# Patient Record
Sex: Female | Born: 1935
Health system: Southern US, Community
[De-identification: ages and names within clinical notes are randomized; demographics above are authoritative.]

## PROBLEM LIST (undated history)

## (undated) DIAGNOSIS — E0590 Thyrotoxicosis, unspecified without thyrotoxic crisis or storm: Secondary | ICD-10-CM

## (undated) DIAGNOSIS — I10 Essential (primary) hypertension: Secondary | ICD-10-CM

## (undated) HISTORY — PX: COLECTOMY: SHX59

## (undated) NOTE — *Deleted (*Deleted)
Physical Therapy Session Note  Patient Details  Name: Cindy Smith MRN: [id number] Date of Birth: 1923/09/28  {CHL IP REHAB PT TIME CALCULATION:[id number]}  Short Term Goals: Week 1:  PT Short Term Goal 1 (Week 1): Pt will transfer to and from [facility] with CGA. PT Short Term Goal 2 (Week 1): Pt will ambulate 60ft with CGA and RW PT Short Term Goal 3 (Week 1): Pt will perform bed mobility with supervision assist  Skilled Therapeutic Interventions/Progress Updates:     Pt received seated in recliner and agrees to therapy. Reports having "rough night and morning", but no complaint of pain. Pt has new hinged knee brace on L knee to provide stability and limit genu valgus. Pt performs SPT to WC with CGA. WC transport to gym for time management. Pt performs SPT to Nustep with CGA and increased time. Performs Nustep on workload of 3, for strengthening, endurance, and reciprocal coordination training. PT uses theraband to provide external force to encourage L hip external rotation with pt verbalizing good response. Pt completes 15:00 with several brief seated rest breaks.  SPT from Nustep>WC>mat table with CGA. Sit to supine with close supervision and cues on positioning. Pt performs supine therex for hip strengthening. 1x20 supine clamshells, 3x15 clamshells in R sidelying with level 1 theraband, 2x10 L hip abduction in R sidelying, 3x10 supine bridges with therband to increase gluteus medius activation and PT providing tactile cues for optimal performance.  Pt ambulates x10' with RW and minA. L foot sliding out laterally during gait due to extreme genu valgus and internal rotation of L hip.  Pt left seated in WC with alarm intact and all needs within reach.  Therapy Documentation Precautions:  Precautions Precautions: Fall Precaution Comments: chronic Lt knee injury, knee brace when OOB Restrictions Weight Bearing Restrictions: No   Therapy/Group: Individual Therapy  Beau Fanny,  PT, DPT 11/23/2019, 12:11 PM

---

## 2000-03-13 ENCOUNTER — Ambulatory Visit (HOSPITAL_COMMUNITY): Admission: RE | Admit: 2000-03-13 | Discharge: 2000-03-13 | Payer: Self-pay | Admitting: Gastroenterology

## 2000-03-13 ENCOUNTER — Encounter (INDEPENDENT_AMBULATORY_CARE_PROVIDER_SITE_OTHER): Payer: Self-pay | Admitting: Specialist

## 2000-03-20 ENCOUNTER — Encounter: Payer: Self-pay | Admitting: General Surgery

## 2000-03-25 ENCOUNTER — Encounter (INDEPENDENT_AMBULATORY_CARE_PROVIDER_SITE_OTHER): Payer: Self-pay

## 2000-03-25 ENCOUNTER — Inpatient Hospital Stay (HOSPITAL_COMMUNITY): Admission: RE | Admit: 2000-03-25 | Discharge: 2000-04-02 | Payer: Self-pay | Admitting: General Surgery

## 2000-04-30 ENCOUNTER — Ambulatory Visit (HOSPITAL_COMMUNITY): Admission: RE | Admit: 2000-04-30 | Discharge: 2000-04-30 | Payer: Self-pay | Admitting: *Deleted

## 2000-04-30 ENCOUNTER — Encounter: Payer: Self-pay | Admitting: *Deleted

## 2000-05-07 ENCOUNTER — Encounter: Admission: RE | Admit: 2000-05-07 | Discharge: 2000-08-05 | Payer: Self-pay | Admitting: *Deleted

## 2000-05-19 ENCOUNTER — Ambulatory Visit (HOSPITAL_COMMUNITY): Admission: RE | Admit: 2000-05-19 | Discharge: 2000-05-19 | Payer: Self-pay | Admitting: *Deleted

## 2000-05-19 ENCOUNTER — Encounter: Payer: Self-pay | Admitting: *Deleted

## 2000-06-20 ENCOUNTER — Encounter: Payer: Self-pay | Admitting: Oncology

## 2000-06-20 ENCOUNTER — Inpatient Hospital Stay (HOSPITAL_COMMUNITY): Admission: EM | Admit: 2000-06-20 | Discharge: 2000-06-25 | Payer: Self-pay | Admitting: Emergency Medicine

## 2000-08-15 ENCOUNTER — Ambulatory Visit (HOSPITAL_COMMUNITY): Admission: RE | Admit: 2000-08-15 | Discharge: 2000-08-15 | Payer: Self-pay | Admitting: *Deleted

## 2001-05-04 ENCOUNTER — Ambulatory Visit (HOSPITAL_COMMUNITY): Admission: RE | Admit: 2001-05-04 | Discharge: 2001-05-04 | Payer: Self-pay | Admitting: Gastroenterology

## 2001-07-21 ENCOUNTER — Encounter: Payer: Self-pay | Admitting: *Deleted

## 2001-07-21 ENCOUNTER — Ambulatory Visit (HOSPITAL_COMMUNITY): Admission: RE | Admit: 2001-07-21 | Discharge: 2001-07-21 | Payer: Self-pay | Admitting: *Deleted

## 2002-05-04 ENCOUNTER — Encounter: Payer: Self-pay | Admitting: Internal Medicine

## 2002-05-04 ENCOUNTER — Inpatient Hospital Stay (HOSPITAL_COMMUNITY): Admission: EM | Admit: 2002-05-04 | Discharge: 2002-05-08 | Payer: Self-pay | Admitting: Emergency Medicine

## 2002-05-04 ENCOUNTER — Encounter: Payer: Self-pay | Admitting: Emergency Medicine

## 2002-05-04 ENCOUNTER — Ambulatory Visit (HOSPITAL_COMMUNITY): Admission: RE | Admit: 2002-05-04 | Discharge: 2002-05-04 | Payer: Self-pay | Admitting: Internal Medicine

## 2002-05-05 ENCOUNTER — Encounter: Payer: Self-pay | Admitting: General Surgery

## 2002-05-06 ENCOUNTER — Encounter: Payer: Self-pay | Admitting: General Surgery

## 2002-05-11 ENCOUNTER — Ambulatory Visit (HOSPITAL_COMMUNITY): Admission: RE | Admit: 2002-05-11 | Discharge: 2002-05-11 | Payer: Self-pay | Admitting: General Surgery

## 2002-05-11 ENCOUNTER — Encounter: Payer: Self-pay | Admitting: General Surgery

## 2002-10-26 ENCOUNTER — Encounter: Payer: Self-pay | Admitting: General Surgery

## 2002-10-26 ENCOUNTER — Inpatient Hospital Stay (HOSPITAL_COMMUNITY): Admission: EM | Admit: 2002-10-26 | Discharge: 2002-11-02 | Payer: Self-pay | Admitting: Emergency Medicine

## 2002-10-26 ENCOUNTER — Encounter (INDEPENDENT_AMBULATORY_CARE_PROVIDER_SITE_OTHER): Payer: Self-pay | Admitting: Specialist

## 2002-10-26 ENCOUNTER — Encounter: Payer: Self-pay | Admitting: Surgery

## 2002-10-26 ENCOUNTER — Encounter: Payer: Self-pay | Admitting: Emergency Medicine

## 2004-04-02 ENCOUNTER — Ambulatory Visit: Payer: Self-pay | Admitting: Oncology

## 2005-04-08 ENCOUNTER — Ambulatory Visit: Payer: Self-pay | Admitting: Oncology

## 2006-04-03 ENCOUNTER — Ambulatory Visit: Payer: Self-pay | Admitting: Oncology

## 2006-04-08 LAB — COMPREHENSIVE METABOLIC PANEL
ALT: 9 U/L (ref 0–35)
AST: 12 U/L (ref 0–37)
Albumin: 4.2 g/dL (ref 3.5–5.2)
Alkaline Phosphatase: 61 U/L (ref 39–117)
BUN: 26 mg/dL — ABNORMAL HIGH (ref 6–23)
CO2: 26 mEq/L (ref 19–32)
Calcium: 9.5 mg/dL (ref 8.4–10.5)
Chloride: 103 mEq/L (ref 96–112)
Creatinine, Ser: 0.84 mg/dL (ref 0.40–1.20)
Glucose, Bld: 95 mg/dL (ref 70–99)
Potassium: 4.4 mEq/L (ref 3.5–5.3)
Sodium: 140 mEq/L (ref 135–145)
Total Bilirubin: 0.3 mg/dL (ref 0.3–1.2)
Total Protein: 7.1 g/dL (ref 6.0–8.3)

## 2006-04-08 LAB — CBC WITH DIFFERENTIAL/PLATELET
BASO%: 0.1 % (ref 0.0–2.0)
Basophils Absolute: 0 10*3/uL (ref 0.0–0.1)
EOS%: 2.2 % (ref 0.0–7.0)
Eosinophils Absolute: 0.1 10*3/uL (ref 0.0–0.5)
HCT: 33.1 % — ABNORMAL LOW (ref 34.8–46.6)
HGB: 11.5 g/dL — ABNORMAL LOW (ref 11.6–15.9)
LYMPH%: 18.9 % (ref 14.0–48.0)
MCH: 30.6 pg (ref 26.0–34.0)
MCHC: 34.7 g/dL (ref 32.0–36.0)
MCV: 88.3 fL (ref 81.0–101.0)
MONO#: 0.4 10*3/uL (ref 0.1–0.9)
MONO%: 7.9 % (ref 0.0–13.0)
NEUT#: 3.5 10*3/uL (ref 1.5–6.5)
NEUT%: 70.9 % (ref 39.6–76.8)
Platelets: 307 10*3/uL (ref 145–400)
RBC: 3.75 10*6/uL (ref 3.70–5.32)
RDW: 13.5 % (ref 11.3–14.5)
WBC: 4.9 10*3/uL (ref 3.9–10.0)
lymph#: 0.9 10*3/uL (ref 0.9–3.3)

## 2006-04-08 LAB — LACTATE DEHYDROGENASE: LDH: 134 U/L (ref 94–250)

## 2006-04-08 LAB — CEA: CEA: 0.8 ng/mL (ref 0.0–5.0)

## 2007-04-10 ENCOUNTER — Ambulatory Visit: Payer: Self-pay | Admitting: Oncology

## 2007-04-14 LAB — CBC WITH DIFFERENTIAL/PLATELET
BASO%: 0.2 % (ref 0.0–2.0)
Basophils Absolute: 0 10*3/uL (ref 0.0–0.1)
EOS%: 1.1 % (ref 0.0–7.0)
Eosinophils Absolute: 0.1 10*3/uL (ref 0.0–0.5)
HCT: 33.5 % — ABNORMAL LOW (ref 34.8–46.6)
HGB: 11.5 g/dL — ABNORMAL LOW (ref 11.6–15.9)
LYMPH%: 18.2 % (ref 14.0–48.0)
MCH: 30.3 pg (ref 26.0–34.0)
MCHC: 34.3 g/dL (ref 32.0–36.0)
MCV: 88.2 fL (ref 81.0–101.0)
MONO#: 0.4 10*3/uL (ref 0.1–0.9)
MONO%: 6.5 % (ref 0.0–13.0)
NEUT#: 4.7 10*3/uL (ref 1.5–6.5)
NEUT%: 74 % (ref 39.6–76.8)
Platelets: 242 10*3/uL (ref 145–400)
RBC: 3.81 10*6/uL (ref 3.70–5.32)
RDW: 14 % (ref 11.3–14.5)
WBC: 6.4 10*3/uL (ref 3.9–10.0)
lymph#: 1.2 10*3/uL (ref 0.9–3.3)

## 2007-04-15 LAB — COMPREHENSIVE METABOLIC PANEL
ALT: 11 U/L (ref 0–35)
AST: 14 U/L (ref 0–37)
Albumin: 4.1 g/dL (ref 3.5–5.2)
Alkaline Phosphatase: 63 U/L (ref 39–117)
BUN: 23 mg/dL (ref 6–23)
CO2: 25 mEq/L (ref 19–32)
Calcium: 9.2 mg/dL (ref 8.4–10.5)
Chloride: 105 mEq/L (ref 96–112)
Creatinine, Ser: 0.74 mg/dL (ref 0.40–1.20)
Glucose, Bld: 109 mg/dL — ABNORMAL HIGH (ref 70–99)
Potassium: 4.3 mEq/L (ref 3.5–5.3)
Sodium: 139 mEq/L (ref 135–145)
Total Bilirubin: 0.4 mg/dL (ref 0.3–1.2)
Total Protein: 6.9 g/dL (ref 6.0–8.3)

## 2007-04-15 LAB — CEA: CEA: 1 ng/mL (ref 0.0–5.0)

## 2007-04-15 LAB — LACTATE DEHYDROGENASE: LDH: 144 U/L (ref 94–250)

## 2008-04-20 ENCOUNTER — Ambulatory Visit: Payer: Self-pay | Admitting: Oncology

## 2008-04-22 LAB — CBC WITH DIFFERENTIAL/PLATELET
BASO%: 0.2 % (ref 0.0–2.0)
Basophils Absolute: 0 10*3/uL (ref 0.0–0.1)
EOS%: 2.4 % (ref 0.0–7.0)
Eosinophils Absolute: 0.1 10*3/uL (ref 0.0–0.5)
HCT: 33.5 % — ABNORMAL LOW (ref 34.8–46.6)
HGB: 11.5 g/dL — ABNORMAL LOW (ref 11.6–15.9)
LYMPH%: 20.3 % (ref 14.0–49.7)
MCH: 30.7 pg (ref 25.1–34.0)
MCHC: 34.2 g/dL (ref 31.5–36.0)
MCV: 89.8 fL (ref 79.5–101.0)
MONO#: 0.4 10*3/uL (ref 0.1–0.9)
MONO%: 7.6 % (ref 0.0–14.0)
NEUT#: 3.6 10*3/uL (ref 1.5–6.5)
NEUT%: 69.5 % (ref 38.4–76.8)
Platelets: 233 10*3/uL (ref 145–400)
RBC: 3.73 10*6/uL (ref 3.70–5.45)
RDW: 13.9 % (ref 11.2–14.5)
WBC: 5.2 10*3/uL (ref 3.9–10.3)
lymph#: 1.1 10*3/uL (ref 0.9–3.3)

## 2008-04-22 LAB — COMPREHENSIVE METABOLIC PANEL
ALT: 9 U/L (ref 0–35)
AST: 14 U/L (ref 0–37)
Albumin: 4.3 g/dL (ref 3.5–5.2)
Alkaline Phosphatase: 66 U/L (ref 39–117)
BUN: 24 mg/dL — ABNORMAL HIGH (ref 6–23)
CO2: 27 mEq/L (ref 19–32)
Calcium: 9.6 mg/dL (ref 8.4–10.5)
Chloride: 101 mEq/L (ref 96–112)
Creatinine, Ser: 0.86 mg/dL (ref 0.40–1.20)
Glucose, Bld: 93 mg/dL (ref 70–99)
Potassium: 5.2 mEq/L (ref 3.5–5.3)
Sodium: 137 mEq/L (ref 135–145)
Total Bilirubin: 0.4 mg/dL (ref 0.3–1.2)
Total Protein: 6.8 g/dL (ref 6.0–8.3)

## 2008-04-22 LAB — MORPHOLOGY: PLT EST: ADEQUATE

## 2008-04-22 LAB — LACTATE DEHYDROGENASE: LDH: 159 U/L (ref 94–250)

## 2008-04-22 LAB — CEA: CEA: 1.4 ng/mL (ref 0.0–5.0)

## 2009-04-19 ENCOUNTER — Ambulatory Visit: Payer: Self-pay | Admitting: Oncology

## 2009-04-21 LAB — COMPREHENSIVE METABOLIC PANEL
ALT: 11 U/L (ref 0–35)
AST: 13 U/L (ref 0–37)
Albumin: 4.2 g/dL (ref 3.5–5.2)
Alkaline Phosphatase: 63 U/L (ref 39–117)
BUN: 22 mg/dL (ref 6–23)
CO2: 28 mEq/L (ref 19–32)
Calcium: 9.3 mg/dL (ref 8.4–10.5)
Chloride: 102 mEq/L (ref 96–112)
Creatinine, Ser: 0.86 mg/dL (ref 0.40–1.20)
Glucose, Bld: 102 mg/dL — ABNORMAL HIGH (ref 70–99)
Potassium: 4.5 mEq/L (ref 3.5–5.3)
Sodium: 138 mEq/L (ref 135–145)
Total Bilirubin: 0.4 mg/dL (ref 0.3–1.2)
Total Protein: 6.9 g/dL (ref 6.0–8.3)

## 2009-04-21 LAB — CBC WITH DIFFERENTIAL/PLATELET
BASO%: 0.1 % (ref 0.0–2.0)
Basophils Absolute: 0 10*3/uL (ref 0.0–0.1)
EOS%: 1.5 % (ref 0.0–7.0)
Eosinophils Absolute: 0.1 10*3/uL (ref 0.0–0.5)
HCT: 32.2 % — ABNORMAL LOW (ref 34.8–46.6)
HGB: 11.1 g/dL — ABNORMAL LOW (ref 11.6–15.9)
LYMPH%: 22.9 % (ref 14.0–49.7)
MCH: 31.6 pg (ref 25.1–34.0)
MCHC: 34.6 g/dL (ref 31.5–36.0)
MCV: 91.3 fL (ref 79.5–101.0)
MONO#: 0.4 10*3/uL (ref 0.1–0.9)
MONO%: 8.7 % (ref 0.0–14.0)
NEUT#: 3.1 10*3/uL (ref 1.5–6.5)
NEUT%: 66.8 % (ref 38.4–76.8)
Platelets: 223 10*3/uL (ref 145–400)
RBC: 3.53 10*6/uL — ABNORMAL LOW (ref 3.70–5.45)
RDW: 14.1 % (ref 11.2–14.5)
WBC: 4.6 10*3/uL (ref 3.9–10.3)
lymph#: 1.1 10*3/uL (ref 0.9–3.3)

## 2009-04-21 LAB — CEA: CEA: 1.4 ng/mL (ref 0.0–5.0)

## 2009-04-21 LAB — LACTATE DEHYDROGENASE: LDH: 138 U/L (ref 94–250)

## 2010-06-22 NOTE — Discharge Summary (Signed)
NAME:  Cindy Smith, Cindy Smith                    ACCOUNT NO.:  [id number]   MEDICAL RECORD NO.:  [id number]                   PATIENT TYPE:  INP   LOCATION:  5736                                 FACILITY:   PHYSICIAN:  Sandria Bales. Ezzard Standing, M.D.               DATE OF BIRTH:  27-Jan-1924   DATE OF ADMISSION:  10/26/2002  DATE OF DISCHARGE:  11/02/2002                                 DISCHARGE SUMMARY   DISCHARGE DIAGNOSES:  1. Small bowel obstruction with perforation.  2. Rectal stricture at prior anastomosis.  3. Hypertension.   OPERATION PERFORMED:  The patient underwent a small bowel resection with  enterolysis of adhesions and rectal dilatation and three lumen central line  placement on October 26, 2002.   HISTORY OF ILLNESS:  This is a 77 year old white female who is a patient of  Dr. Adelfa Koh. Tisovec.  Has been seen by my partner Kendrick Ranch who presented  on September 21 with increasing abdominal distention.  She actually was  hospitalized at [facility] from the 16th to the 19th of  September 2004 for small bowel obstruction which she thought got better.  She was discharged home, but appeared to have incomplete resolution of this  obstruction.  She then presented to the emergency room early in the morning  of September 21 and a chest x-ray/KUB showed free air in her abdomen with  small bowel obstruction.   Significantly, she had a low anterior resection for a colon cancer by Dr.  Kendrick Ranch in February 2002.  She underwent chemotherapy by Aliene Altes.  She  is currently followed by Dr. Riley Churches from an oncology standpoint.  Had radiation therapy by Dr. Jackelyn Knife.  Dr. Vilinda Boehringer has been her  gastroenterologist.   Her other significant past history is she has been treated for hypertension  and she had a vaginal hysterectomy for benign causes in 1975.   PHYSICAL EXAMINATION:  VITAL SIGNS:  Temperature 100, blood pressure 116/47,  pulse 71.  ABDOMEN:   Distended.  She had decreased bowel sounds with really not much  localized tenderness.   Her white blood count was 3400.  Hemoglobin 12, hematocrit 38.  Her  potassium was 2.5, CO2 26, creatinine 0.8.   IMPRESSION:  She had a bowel obstruction with probably perforation.  She was  taken to the operating room where she underwent a resection of a segment of  small bowel which had been obstructed and locally perforated.  She also  underwent endolysis of adhesions.  I did a rectal dilatation while she was  asleep and placed a triple lumen catheter on her.   The first postoperative day I put her in the unit, her potassium was up to  3.6, white blood count was 6500 and she was actually doing very well.  Her  Foley was taken out on the second postoperative day.  Her NG tube removed on  the third postoperative  day.  She was slowly advanced to a diet.   She started having bowel movements on the fifth postoperative day.  She is  now seven days postop.  She is tolerating a regular diet.  She is still  having irregularity of her bowel movements.  Her wound looks good. She is  ready to have her staples removed.   Her final pathology report showed a segment of small bowel with perforation  and mucosal ulceration and mucosal inflammation, but there was no evidence  of malignancy identified.  Seven mesenteric lymph nodes were also removed  and they were negative for any tumor.   This was a benign small bowel obstruction.   DISCHARGE INSTRUCTIONS:  Vicodin for pain.  She is encouraged to take a  multivitamin.  She is to resume any home medicines she was on before she  came in.  She will see me back in two weeks in the office.  Call for  interval problem.   Overall condition is improved.                                                  Sandria Bales. Ezzard Standing, M.D.    DHN/MEDQ  D:  11/02/2002  T:  11/02/2002  Job:  [id number]   cc:   Gaspar Garbe, M.D.  [address]  [place]  [place]  [address]  Fax: [phone]   Genene Churn. Cyndie Chime, M.D.  [address] [facility]  [place]  [place] [address]  Fax: [phone]   Elmer Sow. Dorna Bloom, M.D.  [address] - [facility]  [place]  [place]  [address]  Fax: [phone]   Llana Aliment. Malon Kindle., M.D.  [address]  [place]  [place] [address]  Fax: [phone]

## 2010-06-22 NOTE — H&P (Signed)
NAME:  Cindy Smith, Cindy Smith                    ACCOUNT NO.:  [id number]   MEDICAL RECORD NO.:  [id number]                   PATIENT TYPE:  EMS   LOCATION:  MAJO                                 FACILITY:  [facility]   PHYSICIAN:  Sandria Bales. Ezzard Standing, M.D.               DATE OF BIRTH:  March 12, 1923   DATE OF ADMISSION:  10/26/2002  DATE OF DISCHARGE:                                HISTORY & PHYSICAL   HISTORY OF PRESENT ILLNESS:  This is a 77 year old, white female who is a  patient of Dr. Guerry Bruin who has been followed by Dr. Kendrick Ranch from a  low anterior resection who has presented with increasing abdominal  distention and pain.  Actually, this all began last week on about September  16, when she was hospitalized at [facility] from September 16 through  September 19, with a bowel obstruction.  She was discharged on Sunday,  September 19, but it sounds like by her story, this was all sort of  incomplete as far as how well she was doing from a bowel obstruction.  She  saw Dr. Wylene Simmer yesterday and then late last night and this morning had  increasing abdominal pain.  She said she had a bowel movement yesterday  about 1 p.m.  She presented to the [facility] Emergency Room where she has  presented again with what looks like a worsening bowel obstruction.  However, on the KUB there is a question of free air under the right  hemidiaphragm.  In February 2002, she underwent low anterior resection by  Dr. Kendrick Ranch.  She had chemotherapy by Dr. Aliene Altes.  Radiation therapy  by Dr. Jackelyn Knife.  Dr. Katrinka Blazing has since left and is now being followed by Dr.  Cyndie Chime.  Her last colonoscopy was by Dr. Randa Evens in April 2003.  She  denies any history of peptic ulcer disease, liver disease or pancreatic  disease, although she has been plagued with a rectal stricture which has  been followed and managed by Dr. Kendrick Ranch.  At this time, they had planned  no further surgery.  Her only other abdominal  surgery was a vaginal  hysterectomy in 1975, and a tubal ligation remotely.   ALLERGIES:  SULFA.   CURRENT MEDICATIONS:  Atenolol 25 mg daily for hypertension.   REVIEW OF SYMPTOMS:  PULMONARY:  No history of pneumonia or tuberculosis.  CARDIAC:  She has had no history of heart disease or chest pain.  She has  had the hypertension for at least two to three years that she has been  treated for.  GASTROINTESTINAL:  See history of present illness.  GENITOURINARY:  No kidney stones or kidney infections.  She is accompanied  by her husband in the emergency room.   PHYSICAL EXAMINATION:  VITAL SIGNS:  Temperature 100, blood pressure 116/47,  pulse 71, respirations 19.  GENERAL:  She is a well-nourished, older, white female, alert and  cooperative on physical exam.  HEENT:  Unremarkable, although her mouth is dry.  NECK:  Supple.  I feel no thyromegaly.  She did have radiation to her neck  in June 2002, I take it for what she said a goiter.  She is not on any  hormone replacement.  BREASTS:  Symmetric.  She has no mass.  LUNGS:  Clear to auscultation with symmetric breath sounds.  HEART:  Regular rate and rhythm.  I hear no murmur or rub.  ABDOMEN:  Distended.  I do hear bowel sounds which are present.  She has  some mild tenderness that is more in her lower abdomen, but she is clearly  distended and tympanitic.  RECTAL:  Tight, almost pin hole anastomosis probably about 6-7 cm from anal  verge and all I got was mucus, so I did not guaiac this.  EXTREMITIES:  Good strength in the upper and lower extremities.  NEUROLOGIC:  Grossly intact.   LABORATORY DATA AND X-RAY FINDINGS:  White blood count of 3400, hemoglobin  12, hematocrit 36.  Sodium 136, potassium 2.4, chloride 102, CO2 26, glucose  99, BUN 26, creatinine 0.8, calcium 8.5.   Review of her films questioned free air under left hemidiaphragm, although I  did a left lateral decubitus film which does confirm free air.    ASSESSMENT/PLAN:  1. Small bowel obstruction with probable perforation of bowel.  Discussed     with the patient the need for emergency laparotomy with probable bowel     resection.  Risks can be life threatening and this is a major     complication.  Will probably be in the intensive care postoperatively and     I have left word with Dr. Rodrigo Ran, who is covering for Dr. Wylene Simmer,     about her planned admission.  2. Rectal stricture.  We will plan dilatation at the time of surgery to see     if we can open that up some.  3. Hypertension.  4. Hypokalemia.                                                Sandria Bales. Ezzard Standing, M.D.    DHN/MEDQ  D:  10/26/2002  T:  10/26/2002  Job:  [id number]   cc:   Gaspar Garbe, M.D.  [address]  [place]  [place] [address]  Fax: [phone]   Genene Churn. Cyndie Chime, M.D.  [address] [facility]  [place]  [place] [address]  Fax: [phone]   Marcial Pacas E. Earlene Plater, M.D.  [address] [place]  [place] [address]  Fax: [phone]   Elmer Sow. Dorna Bloom, M.D.  [address] - [facility]  [place]  [place]  [address]  Fax: [phone]   Llana Aliment. Malon Kindle., M.D.  [address]  [place]  [place] [address]  Fax: [phone]

## 2010-06-22 NOTE — Op Note (Signed)
NAME:  KEVEN, SOUCY NO.:  [id number]   MEDICAL RECORD NO.:  [id number]                   PATIENT TYPE:  INP   LOCATION:  2103                                 FACILITY:  [facility]   PHYSICIAN:  Sandria Bales. Ezzard Standing, M.D.               DATE OF BIRTH:  1923/05/05   DATE OF PROCEDURE:  10/26/2002  DATE OF DISCHARGE:                                 OPERATIVE REPORT   PREOPERATIVE DIAGNOSIS:  Small bowel obstruction with pneumoperitoneum and  rectal stricture.   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   PROCEDURES:  1. Small bowel resection with enterolysis of adhesions.  2. Rectal dilatation  3. Left triple-lumen subclavian catheter insertion.   SURGEON:  Sandria Bales. Ezzard Standing, M.D.   FIRST ASSISTANT:  Abigail Miyamoto, M.D.   ANESTHESIA:  General endotracheal.   ESTIMATED BLOOD LOSS:  200 mL.   INDICATION FOR PROCEDURE:  Ms. Gilles is a 77 year old white female who  presents with a worsening small bowel obstruction to the emergency room.  On  KUB there was evidence of free air under her right hemidiaphragm and she now  comes to the operating room for abdominal exploration.  The indications and  potential complications explained to the patient and her husband, potential  complications include but not limited to bleeding, infection, the  possibility of needing a bowel resection or ostomy.  The patient has already  had 1 g of Cefotetan.  She had a Foley catheter in place, NG tube in place,  IV fluids running.   The patient was placed in a supine position, given a general endotracheal  anesthetic.  Her abdomen was prepped with Betadine solution and sterilely  draped.  A midline incision was made with sharp dissection and carried down  to the abdominal cavity.  She had extensive adhesions,  particularly with her  omentum and colon to the anterior abdominal wall, which really limited  getting to the abdominal cavity, but I was able to reflect the omentum over  to the left side so I was able to kind of go on the right side of the  omentum and found dilated small bowel, mobilized this up, and found a closed  loop of small bowel, which had perforated at the point of obstruction.   I was able to free up all the small bowel, found distally decompressed  bowel, or actually her colon was not dilated as I suspected with her history  of her anal stricture, a rectal stricture.  I resected the segment of bowel  which was damaged both by the perforation and caught up in this knot, which  was kind of a double closed loop obstruction.  This resected probably 25 cm  of small bowel.  I took the mesentery down, sent the bowel for pathologic  evaluation.  There was no obvious tumor growth, but she did have thickening  of the small bowel and some changes that would be consistent with possible  radiation enteritis.  The small bowel was then stapled together using an  Endo-GIA stapler.  I closed the enterotomy with an interrupted 2-0 silk  suture and closed the mesentery with a running 2-0 chromic suture and  returned the bowel to the abdomen.  I irrigated the abdomen with 5 L of  saline, positioned the NG tube.  I was unable to palpate the liver because  of adhesions up over the right upper quadrant.  Again, the colon draped down  across the midline but I did not feel any mass or nodule within the colon or  pelvis.  Of note, if for some reason she should have an abdominal hernia,  she would not be a good laparoscopic candidate because she really does have  extensive intra-abdominal adhesions.   I then closed the abdomen with two running #1 PDS sutures, closed the skin  with skin staples.   I then placed patient in lithotomy.  Again, on my preoperative evaluation  she had a rectal stricture,  which really her rectum where she had the prior  low anterior resection had constricted down to maybe only 3 or 4 mm in  diameter.  I dilated it up to at least the size of the rigid endoscope, and  which I scoped the patient.  I irrigated the rectum up and out and dilated  it up with the patient in lithotomy position.  I also placed a left  subclavian triple-lumen catheter with a single stick using a Seldinger  technique and an Arrow kit.  I flushed each line.  I had good flow but the  catheter actually flowed intermittently, so I left it in place and I went on  and we will get a chest x-ray in the recovery room to check the position of  the left subclavian catheter.   The patient tolerated the procedure well, was transported to the recovery  room in good condition.  Sponge and needle count were correct at the end of  the case.  Again, chest x-ray is pending at the time of dictation, which I  will have to check and see how the central line is.                                               Sandria Bales. Ezzard Standing, M.D.    DHN/MEDQ  D:  10/26/2002  T:  10/27/2002  Job:  [id number]   cc:   Gaspar Garbe, M.D.  [address]  [place]  [place] [address]  Fax: [phone]   Genene Churn. Cyndie Chime, M.D.  [address] [facility]  [place]  [place] [address]  Fax: [phone]   Elmer Sow. Dorna Bloom, M.D.  [address] - [facility]  [place]  [place]  [address]  Fax: [phone]   Llana Aliment. Malon Kindle., M.D.  [address]  [place]  [place] [address]  Fax: [phone]

## 2010-06-22 NOTE — Procedures (Signed)
[facility]  Patient:    Cindy Smith, Cindy Smith Visit Number: [id number] MRN: [id number]          Service Type: END Location: ENDO Attending Physician:  Orland Mustard Dictated by:   Llana Aliment. Randa Evens, M.D. Proc. Date: 05/04/01 Admit Date:  05/04/2001   CC:         Timothy E. Earlene Plater, M.D.  Gaspar Garbe, M.D.  Jamesetta Geralds, M.D.  Darci Needle, M.D.  Aliene Altes, M.D.   Procedure Report  DATE OF BIRTH:  04-16-1923.  PROCEDURE:  Colonoscopy.  MEDICATIONS:  Fentanyl 62.5 mcg, Versed 6 mg IV.  SCOPE:  Pediatric Olympus video colonoscope switched over to Olympus upper endoscope.  INDICATION:  A nice woman who underwent a low anterior resection for carcinoma of the rectum Stage II T3N0 and radiation therapy as well as radiation of a thyroid nodule. This is done as a one-year followup.  DESCRIPTION OF PROCEDURE:  The procedure has been explained to the patient and consent obtained. With the patient in the left lateral decubitus position, the Olympus pediatric video colonoscope is inserted and advanced under direct visualization. In the proximal rectum was a very narrowed strictured area. Despite gentle pressure, the scope would not pass. It was quite friable and bled easily. After some time attempting to pass this, I removed the pediatric scope. It was obvious this stricture was way too tight for passage of a the pediatric scope and it was switched over to the Olympus upper endoscope. With gentle pressure, this passed. The colon was quite clean on the opposite side and we were able to advance over the the right upper quadrant the full extent of the scope. Then, we placed the patient on her back and using suction and abdominal pressure and pushing and using all the maneuvers we were able to advance down to the tip of the cecum. The ileocecal valve and appendiceal orifice were seen. The scope was withdrawn and the cecum, ascending  colon, hepatic flexure, transverse colon, splenic flexure, descending, and sigmoid colon were seen well. In the proximal rectum was a stricture; again, the upper endoscope was required to go through this and it was about the same diameter as the upper endoscope, i.e., about 9 mm. It was friable but no signs of cancer. It had the appearance of benign stricture. It was photographed. No polyps were seen throughout the entire colon; no significant diverticular disease. The scope was withdrawn. The patient tolerated the procedure well.  ASSESSMENT: 1. Rectal stricture approximately 9 mm in diameter. It did not allow passage    of the pediatric colonoscope; required the upper endoscope to pass and    gentle pressure. Fortunately, complete colonoscopy could be performed. This     will probably cause problems. If she is tolerating this okay and her stools    are soft, she may not need any treatment of this. 2. No evidence of recurrent polyps. One was Stage II rectal cancer status post    low and anterior radiation therapy.  PLAN:  Will start the patient on MiraLax. Will see back in the office in four to six weeks and will discuss with her further at that time if a balloon dilatation might be needed. Recommend a repeat colonoscopy in three years. Dictated by:   Llana Aliment. Randa Evens, M.D. Attending Physician:  Orland Mustard DD:  05/04/01 TD:  05/04/01 Job: [id number]

## 2010-08-10 ENCOUNTER — Encounter (HOSPITAL_BASED_OUTPATIENT_CLINIC_OR_DEPARTMENT_OTHER): Payer: Medicare Other | Admitting: Oncology

## 2010-08-10 ENCOUNTER — Other Ambulatory Visit: Payer: Self-pay | Admitting: Oncology

## 2010-08-10 DIAGNOSIS — C20 Malignant neoplasm of rectum: Secondary | ICD-10-CM

## 2010-08-10 LAB — CBC & DIFF AND RETIC
BASO%: 0.2 % (ref 0.0–2.0)
Basophils Absolute: 0 10*3/uL (ref 0.0–0.1)
EOS%: 1.6 % (ref 0.0–7.0)
Eosinophils Absolute: 0.1 10*3/uL (ref 0.0–0.5)
HCT: 34.6 % — ABNORMAL LOW (ref 34.8–46.6)
HGB: 11.6 g/dL (ref 11.6–15.9)
Immature Retic Fract: 3.3 % (ref 0.00–10.70)
LYMPH%: 25.7 % (ref 14.0–49.7)
MCH: 30.1 pg (ref 25.1–34.0)
MCHC: 33.5 g/dL (ref 31.5–36.0)
MCV: 89.6 fL (ref 79.5–101.0)
MONO#: 0.4 10*3/uL (ref 0.1–0.9)
MONO%: 9.4 % (ref 0.0–14.0)
NEUT#: 2.7 10*3/uL (ref 1.5–6.5)
NEUT%: 63.1 % (ref 38.4–76.8)
Platelets: 185 10*3/uL (ref 145–400)
RBC: 3.86 10*6/uL (ref 3.70–5.45)
RDW: 13.5 % (ref 11.2–14.5)
Retic %: 1.28 % (ref 0.50–1.50)
Retic Ct Abs: 49.41 10*3/uL (ref 18.30–72.70)
WBC: 4.4 10*3/uL (ref 3.9–10.3)
lymph#: 1.1 10*3/uL (ref 0.9–3.3)

## 2010-08-10 LAB — MORPHOLOGY: PLT EST: ADEQUATE

## 2010-08-10 LAB — CHCC SMEAR

## 2010-08-14 LAB — COMPREHENSIVE METABOLIC PANEL
ALT: 12 U/L (ref 0–35)
AST: 13 U/L (ref 0–37)
Albumin: 4.3 g/dL (ref 3.5–5.2)
Alkaline Phosphatase: 62 U/L (ref 39–117)
BUN: 22 mg/dL (ref 6–23)
CO2: 26 mEq/L (ref 19–32)
Calcium: 9.4 mg/dL (ref 8.4–10.5)
Chloride: 102 mEq/L (ref 96–112)
Creatinine, Ser: 0.84 mg/dL (ref 0.50–1.10)
Glucose, Bld: 82 mg/dL (ref 70–99)
Potassium: 4.6 mEq/L (ref 3.5–5.3)
Sodium: 137 mEq/L (ref 135–145)
Total Bilirubin: 0.4 mg/dL (ref 0.3–1.2)
Total Protein: 6.9 g/dL (ref 6.0–8.3)

## 2010-08-14 LAB — IMMUNOFIXATION ELECTROPHORESIS
IgA: 205 mg/dL (ref 68–380)
IgG (Immunoglobin G), Serum: 1320 mg/dL (ref 690–1700)
IgM, Serum: 94 mg/dL (ref 52–322)
Total Protein, Serum Electrophoresis: 6.9 g/dL (ref 6.0–8.3)

## 2010-08-14 LAB — CEA: CEA: 1.5 ng/mL (ref 0.0–5.0)

## 2010-08-14 LAB — LACTATE DEHYDROGENASE: LDH: 147 U/L (ref 94–250)

## 2010-08-17 ENCOUNTER — Encounter: Payer: Medicare Other | Admitting: Oncology

## 2011-03-12 DIAGNOSIS — I10 Essential (primary) hypertension: Secondary | ICD-10-CM | POA: Diagnosis not present

## 2011-03-12 DIAGNOSIS — E785 Hyperlipidemia, unspecified: Secondary | ICD-10-CM | POA: Diagnosis not present

## 2011-03-12 DIAGNOSIS — R82998 Other abnormal findings in urine: Secondary | ICD-10-CM | POA: Diagnosis not present

## 2011-03-12 DIAGNOSIS — E559 Vitamin D deficiency, unspecified: Secondary | ICD-10-CM | POA: Diagnosis not present

## 2011-03-20 DIAGNOSIS — Z0000 Encounter for general adult medical examination without abnormal findings: Secondary | ICD-10-CM | POA: Diagnosis not present

## 2011-03-20 DIAGNOSIS — N3090 Cystitis, unspecified without hematuria: Secondary | ICD-10-CM | POA: Diagnosis not present

## 2011-03-20 DIAGNOSIS — M810 Age-related osteoporosis without current pathological fracture: Secondary | ICD-10-CM | POA: Diagnosis not present

## 2011-03-20 DIAGNOSIS — I10 Essential (primary) hypertension: Secondary | ICD-10-CM | POA: Diagnosis not present

## 2011-03-22 DIAGNOSIS — Z1212 Encounter for screening for malignant neoplasm of rectum: Secondary | ICD-10-CM | POA: Diagnosis not present

## 2011-05-22 DIAGNOSIS — H04129 Dry eye syndrome of unspecified lacrimal gland: Secondary | ICD-10-CM | POA: Diagnosis not present

## 2011-06-11 DIAGNOSIS — Z1231 Encounter for screening mammogram for malignant neoplasm of breast: Secondary | ICD-10-CM | POA: Diagnosis not present

## 2011-07-31 DIAGNOSIS — K6389 Other specified diseases of intestine: Secondary | ICD-10-CM | POA: Diagnosis not present

## 2011-07-31 DIAGNOSIS — Z85048 Personal history of other malignant neoplasm of rectum, rectosigmoid junction, and anus: Secondary | ICD-10-CM | POA: Diagnosis not present

## 2011-07-31 DIAGNOSIS — Z09 Encounter for follow-up examination after completed treatment for conditions other than malignant neoplasm: Secondary | ICD-10-CM | POA: Diagnosis not present

## 2011-07-31 DIAGNOSIS — K624 Stenosis of anus and rectum: Secondary | ICD-10-CM | POA: Diagnosis not present

## 2011-08-26 DIAGNOSIS — M21619 Bunion of unspecified foot: Secondary | ICD-10-CM | POA: Diagnosis not present

## 2011-08-26 DIAGNOSIS — S93409A Sprain of unspecified ligament of unspecified ankle, initial encounter: Secondary | ICD-10-CM | POA: Diagnosis not present

## 2011-09-03 DIAGNOSIS — S90129A Contusion of unspecified lesser toe(s) without damage to nail, initial encounter: Secondary | ICD-10-CM | POA: Diagnosis not present

## 2011-11-08 DIAGNOSIS — M2040 Other hammer toe(s) (acquired), unspecified foot: Secondary | ICD-10-CM | POA: Diagnosis not present

## 2011-11-08 DIAGNOSIS — M19079 Primary osteoarthritis, unspecified ankle and foot: Secondary | ICD-10-CM | POA: Diagnosis not present

## 2011-12-05 DIAGNOSIS — Z23 Encounter for immunization: Secondary | ICD-10-CM | POA: Diagnosis not present

## 2012-03-17 DIAGNOSIS — I10 Essential (primary) hypertension: Secondary | ICD-10-CM | POA: Diagnosis not present

## 2012-03-17 DIAGNOSIS — E559 Vitamin D deficiency, unspecified: Secondary | ICD-10-CM | POA: Diagnosis not present

## 2012-03-17 DIAGNOSIS — R82998 Other abnormal findings in urine: Secondary | ICD-10-CM | POA: Diagnosis not present

## 2012-03-17 DIAGNOSIS — E785 Hyperlipidemia, unspecified: Secondary | ICD-10-CM | POA: Diagnosis not present

## 2012-03-24 DIAGNOSIS — Z0000 Encounter for general adult medical examination without abnormal findings: Secondary | ICD-10-CM | POA: Diagnosis not present

## 2012-03-24 DIAGNOSIS — E559 Vitamin D deficiency, unspecified: Secondary | ICD-10-CM | POA: Diagnosis not present

## 2012-03-24 DIAGNOSIS — I10 Essential (primary) hypertension: Secondary | ICD-10-CM | POA: Diagnosis not present

## 2012-03-24 DIAGNOSIS — E042 Nontoxic multinodular goiter: Secondary | ICD-10-CM | POA: Diagnosis not present

## 2012-03-27 DIAGNOSIS — Z1212 Encounter for screening for malignant neoplasm of rectum: Secondary | ICD-10-CM | POA: Diagnosis not present

## 2012-05-29 DIAGNOSIS — H40019 Open angle with borderline findings, low risk, unspecified eye: Secondary | ICD-10-CM | POA: Diagnosis not present

## 2012-07-31 DIAGNOSIS — H40019 Open angle with borderline findings, low risk, unspecified eye: Secondary | ICD-10-CM | POA: Diagnosis not present

## 2012-09-21 DIAGNOSIS — I10 Essential (primary) hypertension: Secondary | ICD-10-CM | POA: Diagnosis not present

## 2012-09-21 DIAGNOSIS — E559 Vitamin D deficiency, unspecified: Secondary | ICD-10-CM | POA: Diagnosis not present

## 2012-09-21 DIAGNOSIS — Z6829 Body mass index (BMI) 29.0-29.9, adult: Secondary | ICD-10-CM | POA: Diagnosis not present

## 2012-09-21 DIAGNOSIS — Z1331 Encounter for screening for depression: Secondary | ICD-10-CM | POA: Diagnosis not present

## 2012-09-21 DIAGNOSIS — E042 Nontoxic multinodular goiter: Secondary | ICD-10-CM | POA: Diagnosis not present

## 2012-09-21 DIAGNOSIS — M810 Age-related osteoporosis without current pathological fracture: Secondary | ICD-10-CM | POA: Diagnosis not present

## 2012-09-21 DIAGNOSIS — E785 Hyperlipidemia, unspecified: Secondary | ICD-10-CM | POA: Diagnosis not present

## 2012-10-14 ENCOUNTER — Other Ambulatory Visit: Payer: Self-pay | Admitting: Dermatology

## 2012-10-14 DIAGNOSIS — Z85828 Personal history of other malignant neoplasm of skin: Secondary | ICD-10-CM | POA: Diagnosis not present

## 2012-10-14 DIAGNOSIS — D485 Neoplasm of uncertain behavior of skin: Secondary | ICD-10-CM | POA: Diagnosis not present

## 2012-10-14 DIAGNOSIS — D0430 Carcinoma in situ of skin of unspecified part of face: Secondary | ICD-10-CM | POA: Diagnosis not present

## 2012-10-14 DIAGNOSIS — L821 Other seborrheic keratosis: Secondary | ICD-10-CM | POA: Diagnosis not present

## 2012-10-14 DIAGNOSIS — L909 Atrophic disorder of skin, unspecified: Secondary | ICD-10-CM | POA: Diagnosis not present

## 2012-10-14 DIAGNOSIS — L608 Other nail disorders: Secondary | ICD-10-CM | POA: Diagnosis not present

## 2012-10-14 DIAGNOSIS — D0439 Carcinoma in situ of skin of other parts of face: Secondary | ICD-10-CM | POA: Diagnosis not present

## 2012-10-14 DIAGNOSIS — L820 Inflamed seborrheic keratosis: Secondary | ICD-10-CM | POA: Diagnosis not present

## 2012-10-14 DIAGNOSIS — L570 Actinic keratosis: Secondary | ICD-10-CM | POA: Diagnosis not present

## 2012-10-20 DIAGNOSIS — Z85828 Personal history of other malignant neoplasm of skin: Secondary | ICD-10-CM | POA: Diagnosis not present

## 2012-10-20 DIAGNOSIS — D0430 Carcinoma in situ of skin of unspecified part of face: Secondary | ICD-10-CM | POA: Diagnosis not present

## 2012-10-20 DIAGNOSIS — D0439 Carcinoma in situ of skin of other parts of face: Secondary | ICD-10-CM | POA: Diagnosis not present

## 2012-10-21 DIAGNOSIS — H40019 Open angle with borderline findings, low risk, unspecified eye: Secondary | ICD-10-CM | POA: Diagnosis not present

## 2012-12-03 DIAGNOSIS — Z23 Encounter for immunization: Secondary | ICD-10-CM | POA: Diagnosis not present

## 2013-01-07 DIAGNOSIS — Z1231 Encounter for screening mammogram for malignant neoplasm of breast: Secondary | ICD-10-CM | POA: Diagnosis not present

## 2013-03-19 DIAGNOSIS — E785 Hyperlipidemia, unspecified: Secondary | ICD-10-CM | POA: Diagnosis not present

## 2013-03-19 DIAGNOSIS — R809 Proteinuria, unspecified: Secondary | ICD-10-CM | POA: Diagnosis not present

## 2013-03-19 DIAGNOSIS — E559 Vitamin D deficiency, unspecified: Secondary | ICD-10-CM | POA: Diagnosis not present

## 2013-03-19 DIAGNOSIS — I10 Essential (primary) hypertension: Secondary | ICD-10-CM | POA: Diagnosis not present

## 2013-03-19 DIAGNOSIS — R82998 Other abnormal findings in urine: Secondary | ICD-10-CM | POA: Diagnosis not present

## 2013-03-19 DIAGNOSIS — E042 Nontoxic multinodular goiter: Secondary | ICD-10-CM | POA: Diagnosis not present

## 2013-04-07 DIAGNOSIS — C189 Malignant neoplasm of colon, unspecified: Secondary | ICD-10-CM | POA: Diagnosis not present

## 2013-04-07 DIAGNOSIS — I10 Essential (primary) hypertension: Secondary | ICD-10-CM | POA: Diagnosis not present

## 2013-04-07 DIAGNOSIS — E042 Nontoxic multinodular goiter: Secondary | ICD-10-CM | POA: Diagnosis not present

## 2013-04-07 DIAGNOSIS — M21619 Bunion of unspecified foot: Secondary | ICD-10-CM | POA: Diagnosis not present

## 2013-04-07 DIAGNOSIS — E559 Vitamin D deficiency, unspecified: Secondary | ICD-10-CM | POA: Diagnosis not present

## 2013-04-07 DIAGNOSIS — M810 Age-related osteoporosis without current pathological fracture: Secondary | ICD-10-CM | POA: Diagnosis not present

## 2013-04-07 DIAGNOSIS — Z0000 Encounter for general adult medical examination without abnormal findings: Secondary | ICD-10-CM | POA: Diagnosis not present

## 2013-04-07 DIAGNOSIS — Z1331 Encounter for screening for depression: Secondary | ICD-10-CM | POA: Diagnosis not present

## 2013-04-07 DIAGNOSIS — E785 Hyperlipidemia, unspecified: Secondary | ICD-10-CM | POA: Diagnosis not present

## 2013-04-13 DIAGNOSIS — Z1212 Encounter for screening for malignant neoplasm of rectum: Secondary | ICD-10-CM | POA: Diagnosis not present

## 2013-04-21 DIAGNOSIS — L820 Inflamed seborrheic keratosis: Secondary | ICD-10-CM | POA: Diagnosis not present

## 2013-04-21 DIAGNOSIS — D1801 Hemangioma of skin and subcutaneous tissue: Secondary | ICD-10-CM | POA: Diagnosis not present

## 2013-04-21 DIAGNOSIS — L821 Other seborrheic keratosis: Secondary | ICD-10-CM | POA: Diagnosis not present

## 2013-04-21 DIAGNOSIS — L723 Sebaceous cyst: Secondary | ICD-10-CM | POA: Diagnosis not present

## 2013-04-21 DIAGNOSIS — Z85828 Personal history of other malignant neoplasm of skin: Secondary | ICD-10-CM | POA: Diagnosis not present

## 2013-04-21 DIAGNOSIS — L570 Actinic keratosis: Secondary | ICD-10-CM | POA: Diagnosis not present

## 2013-04-21 DIAGNOSIS — L738 Other specified follicular disorders: Secondary | ICD-10-CM | POA: Diagnosis not present

## 2013-04-21 DIAGNOSIS — D239 Other benign neoplasm of skin, unspecified: Secondary | ICD-10-CM | POA: Diagnosis not present

## 2013-04-27 DIAGNOSIS — M810 Age-related osteoporosis without current pathological fracture: Secondary | ICD-10-CM | POA: Diagnosis not present

## 2013-04-28 DIAGNOSIS — H26499 Other secondary cataract, unspecified eye: Secondary | ICD-10-CM | POA: Diagnosis not present

## 2013-04-28 DIAGNOSIS — H40019 Open angle with borderline findings, low risk, unspecified eye: Secondary | ICD-10-CM | POA: Diagnosis not present

## 2013-10-12 DIAGNOSIS — M21619 Bunion of unspecified foot: Secondary | ICD-10-CM | POA: Diagnosis not present

## 2013-10-12 DIAGNOSIS — E785 Hyperlipidemia, unspecified: Secondary | ICD-10-CM | POA: Diagnosis not present

## 2013-10-12 DIAGNOSIS — S93409A Sprain of unspecified ligament of unspecified ankle, initial encounter: Secondary | ICD-10-CM | POA: Diagnosis not present

## 2013-10-12 DIAGNOSIS — E042 Nontoxic multinodular goiter: Secondary | ICD-10-CM | POA: Diagnosis not present

## 2013-10-12 DIAGNOSIS — E559 Vitamin D deficiency, unspecified: Secondary | ICD-10-CM | POA: Diagnosis not present

## 2013-10-12 DIAGNOSIS — I10 Essential (primary) hypertension: Secondary | ICD-10-CM | POA: Diagnosis not present

## 2013-10-12 DIAGNOSIS — M810 Age-related osteoporosis without current pathological fracture: Secondary | ICD-10-CM | POA: Diagnosis not present

## 2013-10-12 DIAGNOSIS — I491 Atrial premature depolarization: Secondary | ICD-10-CM | POA: Diagnosis not present

## 2013-11-26 DIAGNOSIS — M2012 Hallux valgus (acquired), left foot: Secondary | ICD-10-CM | POA: Diagnosis not present

## 2013-11-26 DIAGNOSIS — M2042 Other hammer toe(s) (acquired), left foot: Secondary | ICD-10-CM | POA: Diagnosis not present

## 2013-12-14 DIAGNOSIS — Z23 Encounter for immunization: Secondary | ICD-10-CM | POA: Diagnosis not present

## 2014-04-05 DIAGNOSIS — E559 Vitamin D deficiency, unspecified: Secondary | ICD-10-CM | POA: Diagnosis not present

## 2014-04-05 DIAGNOSIS — E785 Hyperlipidemia, unspecified: Secondary | ICD-10-CM | POA: Diagnosis not present

## 2014-04-05 DIAGNOSIS — I10 Essential (primary) hypertension: Secondary | ICD-10-CM | POA: Diagnosis not present

## 2014-04-05 DIAGNOSIS — E042 Nontoxic multinodular goiter: Secondary | ICD-10-CM | POA: Diagnosis not present

## 2014-04-05 DIAGNOSIS — R8299 Other abnormal findings in urine: Secondary | ICD-10-CM | POA: Diagnosis not present

## 2014-04-05 DIAGNOSIS — R8290 Unspecified abnormal findings in urine: Secondary | ICD-10-CM | POA: Diagnosis not present

## 2014-04-12 DIAGNOSIS — E785 Hyperlipidemia, unspecified: Secondary | ICD-10-CM | POA: Diagnosis not present

## 2014-04-12 DIAGNOSIS — E042 Nontoxic multinodular goiter: Secondary | ICD-10-CM | POA: Diagnosis not present

## 2014-04-12 DIAGNOSIS — D638 Anemia in other chronic diseases classified elsewhere: Secondary | ICD-10-CM | POA: Diagnosis not present

## 2014-04-12 DIAGNOSIS — I10 Essential (primary) hypertension: Secondary | ICD-10-CM | POA: Diagnosis not present

## 2014-04-12 DIAGNOSIS — E559 Vitamin D deficiency, unspecified: Secondary | ICD-10-CM | POA: Diagnosis not present

## 2014-04-12 DIAGNOSIS — R202 Paresthesia of skin: Secondary | ICD-10-CM | POA: Diagnosis not present

## 2014-04-12 DIAGNOSIS — M810 Age-related osteoporosis without current pathological fracture: Secondary | ICD-10-CM | POA: Diagnosis not present

## 2014-04-12 DIAGNOSIS — Z1389 Encounter for screening for other disorder: Secondary | ICD-10-CM | POA: Diagnosis not present

## 2014-04-12 DIAGNOSIS — N3090 Cystitis, unspecified without hematuria: Secondary | ICD-10-CM | POA: Diagnosis not present

## 2014-04-12 DIAGNOSIS — Z0000 Encounter for general adult medical examination without abnormal findings: Secondary | ICD-10-CM | POA: Diagnosis not present

## 2014-04-21 DIAGNOSIS — Z1212 Encounter for screening for malignant neoplasm of rectum: Secondary | ICD-10-CM | POA: Diagnosis not present

## 2014-05-03 DIAGNOSIS — H02831 Dermatochalasis of right upper eyelid: Secondary | ICD-10-CM | POA: Diagnosis not present

## 2014-05-03 DIAGNOSIS — H26493 Other secondary cataract, bilateral: Secondary | ICD-10-CM | POA: Diagnosis not present

## 2014-05-03 DIAGNOSIS — H04123 Dry eye syndrome of bilateral lacrimal glands: Secondary | ICD-10-CM | POA: Diagnosis not present

## 2014-05-03 DIAGNOSIS — H02834 Dermatochalasis of left upper eyelid: Secondary | ICD-10-CM | POA: Diagnosis not present

## 2014-10-06 DIAGNOSIS — E785 Hyperlipidemia, unspecified: Secondary | ICD-10-CM | POA: Diagnosis not present

## 2014-10-06 DIAGNOSIS — E538 Deficiency of other specified B group vitamins: Secondary | ICD-10-CM | POA: Diagnosis not present

## 2014-10-06 DIAGNOSIS — Z85038 Personal history of other malignant neoplasm of large intestine: Secondary | ICD-10-CM | POA: Diagnosis not present

## 2014-10-06 DIAGNOSIS — D638 Anemia in other chronic diseases classified elsewhere: Secondary | ICD-10-CM | POA: Diagnosis not present

## 2014-10-06 DIAGNOSIS — E559 Vitamin D deficiency, unspecified: Secondary | ICD-10-CM | POA: Diagnosis not present

## 2014-10-06 DIAGNOSIS — I10 Essential (primary) hypertension: Secondary | ICD-10-CM | POA: Diagnosis not present

## 2014-10-06 DIAGNOSIS — M810 Age-related osteoporosis without current pathological fracture: Secondary | ICD-10-CM | POA: Diagnosis not present

## 2014-10-06 DIAGNOSIS — Z1389 Encounter for screening for other disorder: Secondary | ICD-10-CM | POA: Diagnosis not present

## 2014-10-06 DIAGNOSIS — Z23 Encounter for immunization: Secondary | ICD-10-CM | POA: Diagnosis not present

## 2014-10-06 DIAGNOSIS — E042 Nontoxic multinodular goiter: Secondary | ICD-10-CM | POA: Diagnosis not present

## 2014-10-06 DIAGNOSIS — G629 Polyneuropathy, unspecified: Secondary | ICD-10-CM | POA: Diagnosis not present

## 2014-10-06 DIAGNOSIS — Z6826 Body mass index (BMI) 26.0-26.9, adult: Secondary | ICD-10-CM | POA: Diagnosis not present

## 2015-04-07 DIAGNOSIS — E538 Deficiency of other specified B group vitamins: Secondary | ICD-10-CM | POA: Diagnosis not present

## 2015-04-07 DIAGNOSIS — E784 Other hyperlipidemia: Secondary | ICD-10-CM | POA: Diagnosis not present

## 2015-04-07 DIAGNOSIS — N390 Urinary tract infection, site not specified: Secondary | ICD-10-CM | POA: Diagnosis not present

## 2015-04-07 DIAGNOSIS — E042 Nontoxic multinodular goiter: Secondary | ICD-10-CM | POA: Diagnosis not present

## 2015-04-07 DIAGNOSIS — M810 Age-related osteoporosis without current pathological fracture: Secondary | ICD-10-CM | POA: Diagnosis not present

## 2015-04-07 DIAGNOSIS — I10 Essential (primary) hypertension: Secondary | ICD-10-CM | POA: Diagnosis not present

## 2015-04-07 DIAGNOSIS — R8290 Unspecified abnormal findings in urine: Secondary | ICD-10-CM | POA: Diagnosis not present

## 2015-04-14 DIAGNOSIS — D638 Anemia in other chronic diseases classified elsewhere: Secondary | ICD-10-CM | POA: Diagnosis not present

## 2015-04-14 DIAGNOSIS — Z6827 Body mass index (BMI) 27.0-27.9, adult: Secondary | ICD-10-CM | POA: Diagnosis not present

## 2015-04-14 DIAGNOSIS — I491 Atrial premature depolarization: Secondary | ICD-10-CM | POA: Diagnosis not present

## 2015-04-14 DIAGNOSIS — Z1389 Encounter for screening for other disorder: Secondary | ICD-10-CM | POA: Diagnosis not present

## 2015-04-14 DIAGNOSIS — R808 Other proteinuria: Secondary | ICD-10-CM | POA: Diagnosis not present

## 2015-04-14 DIAGNOSIS — G629 Polyneuropathy, unspecified: Secondary | ICD-10-CM | POA: Diagnosis not present

## 2015-04-14 DIAGNOSIS — E538 Deficiency of other specified B group vitamins: Secondary | ICD-10-CM | POA: Diagnosis not present

## 2015-04-14 DIAGNOSIS — N182 Chronic kidney disease, stage 2 (mild): Secondary | ICD-10-CM | POA: Diagnosis not present

## 2015-04-14 DIAGNOSIS — N3090 Cystitis, unspecified without hematuria: Secondary | ICD-10-CM | POA: Diagnosis not present

## 2015-04-14 DIAGNOSIS — R2689 Other abnormalities of gait and mobility: Secondary | ICD-10-CM | POA: Diagnosis not present

## 2015-04-14 DIAGNOSIS — M2010 Hallux valgus (acquired), unspecified foot: Secondary | ICD-10-CM | POA: Diagnosis not present

## 2015-04-14 DIAGNOSIS — Z0000 Encounter for general adult medical examination without abnormal findings: Secondary | ICD-10-CM | POA: Diagnosis not present

## 2015-04-26 DIAGNOSIS — G629 Polyneuropathy, unspecified: Secondary | ICD-10-CM | POA: Diagnosis not present

## 2015-04-26 DIAGNOSIS — I129 Hypertensive chronic kidney disease with stage 1 through stage 4 chronic kidney disease, or unspecified chronic kidney disease: Secondary | ICD-10-CM | POA: Diagnosis not present

## 2015-04-26 DIAGNOSIS — R531 Weakness: Secondary | ICD-10-CM | POA: Diagnosis not present

## 2015-04-26 DIAGNOSIS — G6289 Other specified polyneuropathies: Secondary | ICD-10-CM | POA: Diagnosis not present

## 2015-04-26 DIAGNOSIS — M810 Age-related osteoporosis without current pathological fracture: Secondary | ICD-10-CM | POA: Diagnosis not present

## 2015-04-26 DIAGNOSIS — N182 Chronic kidney disease, stage 2 (mild): Secondary | ICD-10-CM | POA: Diagnosis not present

## 2015-04-26 DIAGNOSIS — Z9181 History of falling: Secondary | ICD-10-CM | POA: Diagnosis not present

## 2015-04-28 DIAGNOSIS — G629 Polyneuropathy, unspecified: Secondary | ICD-10-CM | POA: Diagnosis not present

## 2015-04-28 DIAGNOSIS — R531 Weakness: Secondary | ICD-10-CM | POA: Diagnosis not present

## 2015-04-28 DIAGNOSIS — M810 Age-related osteoporosis without current pathological fracture: Secondary | ICD-10-CM | POA: Diagnosis not present

## 2015-04-28 DIAGNOSIS — I129 Hypertensive chronic kidney disease with stage 1 through stage 4 chronic kidney disease, or unspecified chronic kidney disease: Secondary | ICD-10-CM | POA: Diagnosis not present

## 2015-04-28 DIAGNOSIS — N182 Chronic kidney disease, stage 2 (mild): Secondary | ICD-10-CM | POA: Diagnosis not present

## 2015-04-28 DIAGNOSIS — Z9181 History of falling: Secondary | ICD-10-CM | POA: Diagnosis not present

## 2015-05-01 DIAGNOSIS — I129 Hypertensive chronic kidney disease with stage 1 through stage 4 chronic kidney disease, or unspecified chronic kidney disease: Secondary | ICD-10-CM | POA: Diagnosis not present

## 2015-05-01 DIAGNOSIS — N182 Chronic kidney disease, stage 2 (mild): Secondary | ICD-10-CM | POA: Diagnosis not present

## 2015-05-01 DIAGNOSIS — Z9181 History of falling: Secondary | ICD-10-CM | POA: Diagnosis not present

## 2015-05-01 DIAGNOSIS — M810 Age-related osteoporosis without current pathological fracture: Secondary | ICD-10-CM | POA: Diagnosis not present

## 2015-05-01 DIAGNOSIS — G629 Polyneuropathy, unspecified: Secondary | ICD-10-CM | POA: Diagnosis not present

## 2015-05-01 DIAGNOSIS — R531 Weakness: Secondary | ICD-10-CM | POA: Diagnosis not present

## 2015-05-03 DIAGNOSIS — M810 Age-related osteoporosis without current pathological fracture: Secondary | ICD-10-CM | POA: Diagnosis not present

## 2015-05-03 DIAGNOSIS — R531 Weakness: Secondary | ICD-10-CM | POA: Diagnosis not present

## 2015-05-03 DIAGNOSIS — N182 Chronic kidney disease, stage 2 (mild): Secondary | ICD-10-CM | POA: Diagnosis not present

## 2015-05-03 DIAGNOSIS — G629 Polyneuropathy, unspecified: Secondary | ICD-10-CM | POA: Diagnosis not present

## 2015-05-03 DIAGNOSIS — I129 Hypertensive chronic kidney disease with stage 1 through stage 4 chronic kidney disease, or unspecified chronic kidney disease: Secondary | ICD-10-CM | POA: Diagnosis not present

## 2015-05-03 DIAGNOSIS — Z9181 History of falling: Secondary | ICD-10-CM | POA: Diagnosis not present

## 2015-05-08 DIAGNOSIS — R531 Weakness: Secondary | ICD-10-CM | POA: Diagnosis not present

## 2015-05-08 DIAGNOSIS — Z9181 History of falling: Secondary | ICD-10-CM | POA: Diagnosis not present

## 2015-05-08 DIAGNOSIS — G629 Polyneuropathy, unspecified: Secondary | ICD-10-CM | POA: Diagnosis not present

## 2015-05-08 DIAGNOSIS — N182 Chronic kidney disease, stage 2 (mild): Secondary | ICD-10-CM | POA: Diagnosis not present

## 2015-05-08 DIAGNOSIS — I129 Hypertensive chronic kidney disease with stage 1 through stage 4 chronic kidney disease, or unspecified chronic kidney disease: Secondary | ICD-10-CM | POA: Diagnosis not present

## 2015-05-08 DIAGNOSIS — M810 Age-related osteoporosis without current pathological fracture: Secondary | ICD-10-CM | POA: Diagnosis not present

## 2015-05-10 DIAGNOSIS — Z9181 History of falling: Secondary | ICD-10-CM | POA: Diagnosis not present

## 2015-05-10 DIAGNOSIS — I129 Hypertensive chronic kidney disease with stage 1 through stage 4 chronic kidney disease, or unspecified chronic kidney disease: Secondary | ICD-10-CM | POA: Diagnosis not present

## 2015-05-10 DIAGNOSIS — M810 Age-related osteoporosis without current pathological fracture: Secondary | ICD-10-CM | POA: Diagnosis not present

## 2015-05-10 DIAGNOSIS — G629 Polyneuropathy, unspecified: Secondary | ICD-10-CM | POA: Diagnosis not present

## 2015-05-10 DIAGNOSIS — N182 Chronic kidney disease, stage 2 (mild): Secondary | ICD-10-CM | POA: Diagnosis not present

## 2015-05-10 DIAGNOSIS — R531 Weakness: Secondary | ICD-10-CM | POA: Diagnosis not present

## 2015-05-12 DIAGNOSIS — M810 Age-related osteoporosis without current pathological fracture: Secondary | ICD-10-CM | POA: Diagnosis not present

## 2015-05-12 DIAGNOSIS — G629 Polyneuropathy, unspecified: Secondary | ICD-10-CM | POA: Diagnosis not present

## 2015-05-12 DIAGNOSIS — Z9181 History of falling: Secondary | ICD-10-CM | POA: Diagnosis not present

## 2015-05-12 DIAGNOSIS — I129 Hypertensive chronic kidney disease with stage 1 through stage 4 chronic kidney disease, or unspecified chronic kidney disease: Secondary | ICD-10-CM | POA: Diagnosis not present

## 2015-05-12 DIAGNOSIS — N182 Chronic kidney disease, stage 2 (mild): Secondary | ICD-10-CM | POA: Diagnosis not present

## 2015-05-12 DIAGNOSIS — R531 Weakness: Secondary | ICD-10-CM | POA: Diagnosis not present

## 2015-05-15 DIAGNOSIS — I129 Hypertensive chronic kidney disease with stage 1 through stage 4 chronic kidney disease, or unspecified chronic kidney disease: Secondary | ICD-10-CM | POA: Diagnosis not present

## 2015-05-15 DIAGNOSIS — R531 Weakness: Secondary | ICD-10-CM | POA: Diagnosis not present

## 2015-05-15 DIAGNOSIS — M810 Age-related osteoporosis without current pathological fracture: Secondary | ICD-10-CM | POA: Diagnosis not present

## 2015-05-15 DIAGNOSIS — Z9181 History of falling: Secondary | ICD-10-CM | POA: Diagnosis not present

## 2015-05-15 DIAGNOSIS — G629 Polyneuropathy, unspecified: Secondary | ICD-10-CM | POA: Diagnosis not present

## 2015-05-15 DIAGNOSIS — N182 Chronic kidney disease, stage 2 (mild): Secondary | ICD-10-CM | POA: Diagnosis not present

## 2015-05-16 DIAGNOSIS — N182 Chronic kidney disease, stage 2 (mild): Secondary | ICD-10-CM | POA: Diagnosis not present

## 2015-05-16 DIAGNOSIS — R531 Weakness: Secondary | ICD-10-CM | POA: Diagnosis not present

## 2015-05-16 DIAGNOSIS — G629 Polyneuropathy, unspecified: Secondary | ICD-10-CM | POA: Diagnosis not present

## 2015-05-16 DIAGNOSIS — Z9181 History of falling: Secondary | ICD-10-CM | POA: Diagnosis not present

## 2015-05-16 DIAGNOSIS — I129 Hypertensive chronic kidney disease with stage 1 through stage 4 chronic kidney disease, or unspecified chronic kidney disease: Secondary | ICD-10-CM | POA: Diagnosis not present

## 2015-05-16 DIAGNOSIS — M810 Age-related osteoporosis without current pathological fracture: Secondary | ICD-10-CM | POA: Diagnosis not present

## 2015-05-23 DIAGNOSIS — R531 Weakness: Secondary | ICD-10-CM | POA: Diagnosis not present

## 2015-05-23 DIAGNOSIS — Z9181 History of falling: Secondary | ICD-10-CM | POA: Diagnosis not present

## 2015-05-23 DIAGNOSIS — N182 Chronic kidney disease, stage 2 (mild): Secondary | ICD-10-CM | POA: Diagnosis not present

## 2015-05-23 DIAGNOSIS — G629 Polyneuropathy, unspecified: Secondary | ICD-10-CM | POA: Diagnosis not present

## 2015-05-23 DIAGNOSIS — M810 Age-related osteoporosis without current pathological fracture: Secondary | ICD-10-CM | POA: Diagnosis not present

## 2015-05-23 DIAGNOSIS — I129 Hypertensive chronic kidney disease with stage 1 through stage 4 chronic kidney disease, or unspecified chronic kidney disease: Secondary | ICD-10-CM | POA: Diagnosis not present

## 2015-05-25 DIAGNOSIS — N182 Chronic kidney disease, stage 2 (mild): Secondary | ICD-10-CM | POA: Diagnosis not present

## 2015-05-25 DIAGNOSIS — G629 Polyneuropathy, unspecified: Secondary | ICD-10-CM | POA: Diagnosis not present

## 2015-05-25 DIAGNOSIS — Z9181 History of falling: Secondary | ICD-10-CM | POA: Diagnosis not present

## 2015-05-25 DIAGNOSIS — R531 Weakness: Secondary | ICD-10-CM | POA: Diagnosis not present

## 2015-05-25 DIAGNOSIS — M810 Age-related osteoporosis without current pathological fracture: Secondary | ICD-10-CM | POA: Diagnosis not present

## 2015-05-25 DIAGNOSIS — I129 Hypertensive chronic kidney disease with stage 1 through stage 4 chronic kidney disease, or unspecified chronic kidney disease: Secondary | ICD-10-CM | POA: Diagnosis not present

## 2015-11-28 DIAGNOSIS — Z23 Encounter for immunization: Secondary | ICD-10-CM | POA: Diagnosis not present

## 2016-04-11 DIAGNOSIS — E042 Nontoxic multinodular goiter: Secondary | ICD-10-CM | POA: Diagnosis not present

## 2016-04-11 DIAGNOSIS — E7800 Pure hypercholesterolemia, unspecified: Secondary | ICD-10-CM | POA: Diagnosis not present

## 2016-04-11 DIAGNOSIS — E538 Deficiency of other specified B group vitamins: Secondary | ICD-10-CM | POA: Diagnosis not present

## 2016-04-11 DIAGNOSIS — E559 Vitamin D deficiency, unspecified: Secondary | ICD-10-CM | POA: Diagnosis not present

## 2016-04-11 DIAGNOSIS — N390 Urinary tract infection, site not specified: Secondary | ICD-10-CM | POA: Diagnosis not present

## 2016-04-11 DIAGNOSIS — R8299 Other abnormal findings in urine: Secondary | ICD-10-CM | POA: Diagnosis not present

## 2016-04-11 DIAGNOSIS — I10 Essential (primary) hypertension: Secondary | ICD-10-CM | POA: Diagnosis not present

## 2016-04-18 DIAGNOSIS — N182 Chronic kidney disease, stage 2 (mild): Secondary | ICD-10-CM | POA: Diagnosis not present

## 2016-04-18 DIAGNOSIS — Z6825 Body mass index (BMI) 25.0-25.9, adult: Secondary | ICD-10-CM | POA: Diagnosis not present

## 2016-04-18 DIAGNOSIS — E7800 Pure hypercholesterolemia, unspecified: Secondary | ICD-10-CM | POA: Diagnosis not present

## 2016-04-18 DIAGNOSIS — R2689 Other abnormalities of gait and mobility: Secondary | ICD-10-CM | POA: Diagnosis not present

## 2016-04-18 DIAGNOSIS — E559 Vitamin D deficiency, unspecified: Secondary | ICD-10-CM | POA: Diagnosis not present

## 2016-04-18 DIAGNOSIS — E538 Deficiency of other specified B group vitamins: Secondary | ICD-10-CM | POA: Diagnosis not present

## 2016-04-18 DIAGNOSIS — I10 Essential (primary) hypertension: Secondary | ICD-10-CM | POA: Diagnosis not present

## 2016-04-18 DIAGNOSIS — D638 Anemia in other chronic diseases classified elsewhere: Secondary | ICD-10-CM | POA: Diagnosis not present

## 2016-04-18 DIAGNOSIS — G629 Polyneuropathy, unspecified: Secondary | ICD-10-CM | POA: Diagnosis not present

## 2016-04-18 DIAGNOSIS — E042 Nontoxic multinodular goiter: Secondary | ICD-10-CM | POA: Diagnosis not present

## 2016-04-18 DIAGNOSIS — R808 Other proteinuria: Secondary | ICD-10-CM | POA: Diagnosis not present

## 2016-04-18 DIAGNOSIS — Z0000 Encounter for general adult medical examination without abnormal findings: Secondary | ICD-10-CM | POA: Diagnosis not present

## 2016-05-16 DIAGNOSIS — H04123 Dry eye syndrome of bilateral lacrimal glands: Secondary | ICD-10-CM | POA: Diagnosis not present

## 2016-05-16 DIAGNOSIS — H524 Presbyopia: Secondary | ICD-10-CM | POA: Diagnosis not present

## 2016-05-16 DIAGNOSIS — H26493 Other secondary cataract, bilateral: Secondary | ICD-10-CM | POA: Diagnosis not present

## 2016-05-16 DIAGNOSIS — H40013 Open angle with borderline findings, low risk, bilateral: Secondary | ICD-10-CM | POA: Diagnosis not present

## 2016-05-16 DIAGNOSIS — H5213 Myopia, bilateral: Secondary | ICD-10-CM | POA: Diagnosis not present

## 2016-05-22 DIAGNOSIS — H26491 Other secondary cataract, right eye: Secondary | ICD-10-CM | POA: Diagnosis not present

## 2016-05-22 DIAGNOSIS — H5213 Myopia, bilateral: Secondary | ICD-10-CM | POA: Diagnosis not present

## 2016-05-22 DIAGNOSIS — Z961 Presence of intraocular lens: Secondary | ICD-10-CM | POA: Diagnosis not present

## 2016-05-22 DIAGNOSIS — H40013 Open angle with borderline findings, low risk, bilateral: Secondary | ICD-10-CM | POA: Diagnosis not present

## 2016-05-22 DIAGNOSIS — H04123 Dry eye syndrome of bilateral lacrimal glands: Secondary | ICD-10-CM | POA: Diagnosis not present

## 2016-05-22 DIAGNOSIS — H524 Presbyopia: Secondary | ICD-10-CM | POA: Diagnosis not present

## 2016-05-29 DIAGNOSIS — H26492 Other secondary cataract, left eye: Secondary | ICD-10-CM | POA: Diagnosis not present

## 2016-05-29 DIAGNOSIS — Z961 Presence of intraocular lens: Secondary | ICD-10-CM | POA: Diagnosis not present

## 2016-06-03 DIAGNOSIS — H20012 Primary iridocyclitis, left eye: Secondary | ICD-10-CM | POA: Diagnosis not present

## 2016-06-04 DIAGNOSIS — H16142 Punctate keratitis, left eye: Secondary | ICD-10-CM | POA: Diagnosis not present

## 2016-06-04 DIAGNOSIS — H20012 Primary iridocyclitis, left eye: Secondary | ICD-10-CM | POA: Diagnosis not present

## 2016-06-05 DIAGNOSIS — H20012 Primary iridocyclitis, left eye: Secondary | ICD-10-CM | POA: Diagnosis not present

## 2016-06-05 DIAGNOSIS — H16142 Punctate keratitis, left eye: Secondary | ICD-10-CM | POA: Diagnosis not present

## 2016-06-07 DIAGNOSIS — H16142 Punctate keratitis, left eye: Secondary | ICD-10-CM | POA: Diagnosis not present

## 2016-06-07 DIAGNOSIS — H20012 Primary iridocyclitis, left eye: Secondary | ICD-10-CM | POA: Diagnosis not present

## 2016-06-07 DIAGNOSIS — H04123 Dry eye syndrome of bilateral lacrimal glands: Secondary | ICD-10-CM | POA: Diagnosis not present

## 2016-06-13 DIAGNOSIS — H20012 Primary iridocyclitis, left eye: Secondary | ICD-10-CM | POA: Diagnosis not present

## 2016-06-13 DIAGNOSIS — H16142 Punctate keratitis, left eye: Secondary | ICD-10-CM | POA: Diagnosis not present

## 2016-07-11 DIAGNOSIS — H4040X0 Glaucoma secondary to eye inflammation, unspecified eye, stage unspecified: Secondary | ICD-10-CM | POA: Diagnosis not present

## 2016-07-11 DIAGNOSIS — H20023 Recurrent acute iridocyclitis, bilateral: Secondary | ICD-10-CM | POA: Diagnosis not present

## 2016-07-17 DIAGNOSIS — H40053 Ocular hypertension, bilateral: Secondary | ICD-10-CM | POA: Diagnosis not present

## 2016-07-17 DIAGNOSIS — H40003 Preglaucoma, unspecified, bilateral: Secondary | ICD-10-CM | POA: Diagnosis not present

## 2016-07-17 DIAGNOSIS — H209 Unspecified iridocyclitis: Secondary | ICD-10-CM | POA: Diagnosis not present

## 2016-07-31 DIAGNOSIS — H40053 Ocular hypertension, bilateral: Secondary | ICD-10-CM | POA: Diagnosis not present

## 2016-07-31 DIAGNOSIS — H209 Unspecified iridocyclitis: Secondary | ICD-10-CM | POA: Diagnosis not present

## 2016-08-27 DIAGNOSIS — H209 Unspecified iridocyclitis: Secondary | ICD-10-CM | POA: Diagnosis not present

## 2016-08-27 DIAGNOSIS — H40003 Preglaucoma, unspecified, bilateral: Secondary | ICD-10-CM | POA: Diagnosis not present

## 2016-09-25 DIAGNOSIS — H40003 Preglaucoma, unspecified, bilateral: Secondary | ICD-10-CM | POA: Diagnosis not present

## 2016-10-23 DIAGNOSIS — Z9181 History of falling: Secondary | ICD-10-CM | POA: Diagnosis not present

## 2016-10-23 DIAGNOSIS — N182 Chronic kidney disease, stage 2 (mild): Secondary | ICD-10-CM | POA: Diagnosis not present

## 2016-10-23 DIAGNOSIS — Z23 Encounter for immunization: Secondary | ICD-10-CM | POA: Diagnosis not present

## 2016-10-23 DIAGNOSIS — E559 Vitamin D deficiency, unspecified: Secondary | ICD-10-CM | POA: Diagnosis not present

## 2016-10-23 DIAGNOSIS — E538 Deficiency of other specified B group vitamins: Secondary | ICD-10-CM | POA: Diagnosis not present

## 2016-10-23 DIAGNOSIS — R2689 Other abnormalities of gait and mobility: Secondary | ICD-10-CM | POA: Diagnosis not present

## 2016-10-23 DIAGNOSIS — M810 Age-related osteoporosis without current pathological fracture: Secondary | ICD-10-CM | POA: Diagnosis not present

## 2016-10-23 DIAGNOSIS — Z6826 Body mass index (BMI) 26.0-26.9, adult: Secondary | ICD-10-CM | POA: Diagnosis not present

## 2016-10-23 DIAGNOSIS — R808 Other proteinuria: Secondary | ICD-10-CM | POA: Diagnosis not present

## 2016-10-23 DIAGNOSIS — E7800 Pure hypercholesterolemia, unspecified: Secondary | ICD-10-CM | POA: Diagnosis not present

## 2016-10-23 DIAGNOSIS — G6289 Other specified polyneuropathies: Secondary | ICD-10-CM | POA: Diagnosis not present

## 2016-10-23 DIAGNOSIS — D638 Anemia in other chronic diseases classified elsewhere: Secondary | ICD-10-CM | POA: Diagnosis not present

## 2017-01-02 DIAGNOSIS — H40003 Preglaucoma, unspecified, bilateral: Secondary | ICD-10-CM | POA: Diagnosis not present

## 2017-04-17 DIAGNOSIS — E559 Vitamin D deficiency, unspecified: Secondary | ICD-10-CM | POA: Diagnosis not present

## 2017-04-17 DIAGNOSIS — I10 Essential (primary) hypertension: Secondary | ICD-10-CM | POA: Diagnosis not present

## 2017-04-17 DIAGNOSIS — R82998 Other abnormal findings in urine: Secondary | ICD-10-CM | POA: Diagnosis not present

## 2017-04-17 DIAGNOSIS — E538 Deficiency of other specified B group vitamins: Secondary | ICD-10-CM | POA: Diagnosis not present

## 2017-04-17 DIAGNOSIS — E042 Nontoxic multinodular goiter: Secondary | ICD-10-CM | POA: Diagnosis not present

## 2017-04-24 DIAGNOSIS — N182 Chronic kidney disease, stage 2 (mild): Secondary | ICD-10-CM | POA: Diagnosis not present

## 2017-04-24 DIAGNOSIS — R809 Proteinuria, unspecified: Secondary | ICD-10-CM | POA: Diagnosis not present

## 2017-04-24 DIAGNOSIS — D638 Anemia in other chronic diseases classified elsewhere: Secondary | ICD-10-CM | POA: Diagnosis not present

## 2017-04-24 DIAGNOSIS — E7800 Pure hypercholesterolemia, unspecified: Secondary | ICD-10-CM | POA: Diagnosis not present

## 2017-04-24 DIAGNOSIS — M810 Age-related osteoporosis without current pathological fracture: Secondary | ICD-10-CM | POA: Diagnosis not present

## 2017-04-24 DIAGNOSIS — Z1389 Encounter for screening for other disorder: Secondary | ICD-10-CM | POA: Diagnosis not present

## 2017-04-24 DIAGNOSIS — R82998 Other abnormal findings in urine: Secondary | ICD-10-CM | POA: Diagnosis not present

## 2017-04-24 DIAGNOSIS — G629 Polyneuropathy, unspecified: Secondary | ICD-10-CM | POA: Diagnosis not present

## 2017-04-24 DIAGNOSIS — E538 Deficiency of other specified B group vitamins: Secondary | ICD-10-CM | POA: Diagnosis not present

## 2017-04-24 DIAGNOSIS — Z0000 Encounter for general adult medical examination without abnormal findings: Secondary | ICD-10-CM | POA: Diagnosis not present

## 2017-04-24 DIAGNOSIS — R2689 Other abnormalities of gait and mobility: Secondary | ICD-10-CM | POA: Diagnosis not present

## 2017-04-24 DIAGNOSIS — I10 Essential (primary) hypertension: Secondary | ICD-10-CM | POA: Diagnosis not present

## 2017-04-24 DIAGNOSIS — Z6826 Body mass index (BMI) 26.0-26.9, adult: Secondary | ICD-10-CM | POA: Diagnosis not present

## 2017-04-24 DIAGNOSIS — Z85038 Personal history of other malignant neoplasm of large intestine: Secondary | ICD-10-CM | POA: Diagnosis not present

## 2017-04-30 DIAGNOSIS — Z1212 Encounter for screening for malignant neoplasm of rectum: Secondary | ICD-10-CM | POA: Diagnosis not present

## 2017-08-15 DIAGNOSIS — H40003 Preglaucoma, unspecified, bilateral: Secondary | ICD-10-CM | POA: Diagnosis not present

## 2017-10-24 DIAGNOSIS — Z6826 Body mass index (BMI) 26.0-26.9, adult: Secondary | ICD-10-CM | POA: Diagnosis not present

## 2017-10-24 DIAGNOSIS — I10 Essential (primary) hypertension: Secondary | ICD-10-CM | POA: Diagnosis not present

## 2017-10-24 DIAGNOSIS — G6289 Other specified polyneuropathies: Secondary | ICD-10-CM | POA: Diagnosis not present

## 2017-10-24 DIAGNOSIS — Z85038 Personal history of other malignant neoplasm of large intestine: Secondary | ICD-10-CM | POA: Diagnosis not present

## 2017-10-24 DIAGNOSIS — Z23 Encounter for immunization: Secondary | ICD-10-CM | POA: Diagnosis not present

## 2017-10-24 DIAGNOSIS — N182 Chronic kidney disease, stage 2 (mild): Secondary | ICD-10-CM | POA: Diagnosis not present

## 2017-10-24 DIAGNOSIS — D638 Anemia in other chronic diseases classified elsewhere: Secondary | ICD-10-CM | POA: Diagnosis not present

## 2017-10-24 DIAGNOSIS — E7800 Pure hypercholesterolemia, unspecified: Secondary | ICD-10-CM | POA: Diagnosis not present

## 2017-10-24 DIAGNOSIS — E559 Vitamin D deficiency, unspecified: Secondary | ICD-10-CM | POA: Diagnosis not present

## 2017-10-24 DIAGNOSIS — M810 Age-related osteoporosis without current pathological fracture: Secondary | ICD-10-CM | POA: Diagnosis not present

## 2017-10-24 DIAGNOSIS — E538 Deficiency of other specified B group vitamins: Secondary | ICD-10-CM | POA: Diagnosis not present

## 2017-10-24 DIAGNOSIS — E042 Nontoxic multinodular goiter: Secondary | ICD-10-CM | POA: Diagnosis not present

## 2019-05-11 DIAGNOSIS — Z23 Encounter for immunization: Secondary | ICD-10-CM | POA: Diagnosis not present

## 2019-10-22 DIAGNOSIS — E7800 Pure hypercholesterolemia, unspecified: Secondary | ICD-10-CM | POA: Diagnosis not present

## 2019-10-22 DIAGNOSIS — E042 Nontoxic multinodular goiter: Secondary | ICD-10-CM | POA: Diagnosis not present

## 2019-10-22 DIAGNOSIS — E538 Deficiency of other specified B group vitamins: Secondary | ICD-10-CM | POA: Diagnosis not present

## 2019-10-22 DIAGNOSIS — E559 Vitamin D deficiency, unspecified: Secondary | ICD-10-CM | POA: Diagnosis not present

## 2019-10-27 DIAGNOSIS — Z0000 Encounter for general adult medical examination without abnormal findings: Secondary | ICD-10-CM | POA: Diagnosis not present

## 2019-10-27 DIAGNOSIS — I491 Atrial premature depolarization: Secondary | ICD-10-CM | POA: Diagnosis not present

## 2019-10-27 DIAGNOSIS — N1832 Chronic kidney disease, stage 3b: Secondary | ICD-10-CM | POA: Diagnosis not present

## 2019-10-27 DIAGNOSIS — E7800 Pure hypercholesterolemia, unspecified: Secondary | ICD-10-CM | POA: Diagnosis not present

## 2019-10-27 DIAGNOSIS — I10 Essential (primary) hypertension: Secondary | ICD-10-CM | POA: Diagnosis not present

## 2019-10-27 DIAGNOSIS — D638 Anemia in other chronic diseases classified elsewhere: Secondary | ICD-10-CM | POA: Diagnosis not present

## 2019-10-27 DIAGNOSIS — Z9181 History of falling: Secondary | ICD-10-CM | POA: Diagnosis not present

## 2019-10-27 DIAGNOSIS — R2689 Other abnormalities of gait and mobility: Secondary | ICD-10-CM | POA: Diagnosis not present

## 2019-10-27 DIAGNOSIS — R82998 Other abnormal findings in urine: Secondary | ICD-10-CM | POA: Diagnosis not present

## 2019-10-27 DIAGNOSIS — Z23 Encounter for immunization: Secondary | ICD-10-CM | POA: Diagnosis not present

## 2019-10-27 DIAGNOSIS — G629 Polyneuropathy, unspecified: Secondary | ICD-10-CM | POA: Diagnosis not present

## 2019-10-27 DIAGNOSIS — M810 Age-related osteoporosis without current pathological fracture: Secondary | ICD-10-CM | POA: Diagnosis not present

## 2019-10-27 DIAGNOSIS — R809 Proteinuria, unspecified: Secondary | ICD-10-CM | POA: Diagnosis not present

## 2019-11-17 ENCOUNTER — Other Ambulatory Visit: Payer: Self-pay

## 2019-11-17 ENCOUNTER — Inpatient Hospital Stay (HOSPITAL_COMMUNITY): Payer: Medicare Other

## 2019-11-17 ENCOUNTER — Emergency Department (HOSPITAL_COMMUNITY): Payer: Medicare Other

## 2019-11-17 ENCOUNTER — Inpatient Hospital Stay (HOSPITAL_COMMUNITY)
Admission: EM | Admit: 2019-11-17 | Discharge: 2019-11-19 | DRG: 065 | Disposition: A | Discharge status: Rehabilitation Facility | Payer: Medicare Other | Attending: Family Medicine | Admitting: Family Medicine

## 2019-11-17 ENCOUNTER — Encounter (HOSPITAL_COMMUNITY): Payer: Self-pay | Admitting: Internal Medicine

## 2019-11-17 DIAGNOSIS — Z8249 Family history of ischemic heart disease and other diseases of the circulatory system: Secondary | ICD-10-CM | POA: Diagnosis not present

## 2019-11-17 DIAGNOSIS — Z85038 Personal history of other malignant neoplasm of large intestine: Secondary | ICD-10-CM

## 2019-11-17 DIAGNOSIS — N1831 Chronic kidney disease, stage 3a: Secondary | ICD-10-CM | POA: Diagnosis present

## 2019-11-17 DIAGNOSIS — Z888 Allergy status to other drugs, medicaments and biological substances status: Secondary | ICD-10-CM

## 2019-11-17 DIAGNOSIS — Z20822 Contact with and (suspected) exposure to covid-19: Secondary | ICD-10-CM | POA: Diagnosis present

## 2019-11-17 DIAGNOSIS — R29818 Other symptoms and signs involving the nervous system: Secondary | ICD-10-CM | POA: Diagnosis not present

## 2019-11-17 DIAGNOSIS — Z885 Allergy status to narcotic agent status: Secondary | ICD-10-CM | POA: Diagnosis not present

## 2019-11-17 DIAGNOSIS — R7989 Other specified abnormal findings of blood chemistry: Secondary | ICD-10-CM | POA: Diagnosis not present

## 2019-11-17 DIAGNOSIS — E1122 Type 2 diabetes mellitus with diabetic chronic kidney disease: Secondary | ICD-10-CM | POA: Diagnosis present

## 2019-11-17 DIAGNOSIS — M4802 Spinal stenosis, cervical region: Secondary | ICD-10-CM | POA: Diagnosis not present

## 2019-11-17 DIAGNOSIS — H9190 Unspecified hearing loss, unspecified ear: Secondary | ICD-10-CM | POA: Diagnosis present

## 2019-11-17 DIAGNOSIS — R159 Full incontinence of feces: Secondary | ICD-10-CM | POA: Diagnosis present

## 2019-11-17 DIAGNOSIS — R001 Bradycardia, unspecified: Secondary | ICD-10-CM | POA: Diagnosis present

## 2019-11-17 DIAGNOSIS — E860 Dehydration: Secondary | ICD-10-CM | POA: Diagnosis present

## 2019-11-17 DIAGNOSIS — Z7982 Long term (current) use of aspirin: Secondary | ICD-10-CM | POA: Diagnosis not present

## 2019-11-17 DIAGNOSIS — S8992XD Unspecified injury of left lower leg, subsequent encounter: Secondary | ICD-10-CM | POA: Diagnosis not present

## 2019-11-17 DIAGNOSIS — I639 Cerebral infarction, unspecified: Secondary | ICD-10-CM | POA: Diagnosis not present

## 2019-11-17 DIAGNOSIS — D638 Anemia in other chronic diseases classified elsewhere: Secondary | ICD-10-CM | POA: Diagnosis present

## 2019-11-17 DIAGNOSIS — M62830 Muscle spasm of back: Secondary | ICD-10-CM | POA: Diagnosis present

## 2019-11-17 DIAGNOSIS — R269 Unspecified abnormalities of gait and mobility: Secondary | ICD-10-CM | POA: Diagnosis present

## 2019-11-17 DIAGNOSIS — E042 Nontoxic multinodular goiter: Secondary | ICD-10-CM | POA: Diagnosis present

## 2019-11-17 DIAGNOSIS — R8271 Bacteriuria: Secondary | ICD-10-CM | POA: Diagnosis present

## 2019-11-17 DIAGNOSIS — I129 Hypertensive chronic kidney disease with stage 1 through stage 4 chronic kidney disease, or unspecified chronic kidney disease: Secondary | ICD-10-CM | POA: Diagnosis present

## 2019-11-17 DIAGNOSIS — M1712 Unilateral primary osteoarthritis, left knee: Secondary | ICD-10-CM | POA: Diagnosis not present

## 2019-11-17 DIAGNOSIS — I10 Essential (primary) hypertension: Secondary | ICD-10-CM | POA: Diagnosis present

## 2019-11-17 DIAGNOSIS — Z9049 Acquired absence of other specified parts of digestive tract: Secondary | ICD-10-CM | POA: Diagnosis not present

## 2019-11-17 DIAGNOSIS — E871 Hypo-osmolality and hyponatremia: Secondary | ICD-10-CM | POA: Diagnosis present

## 2019-11-17 DIAGNOSIS — I69398 Other sequelae of cerebral infarction: Secondary | ICD-10-CM | POA: Diagnosis present

## 2019-11-17 DIAGNOSIS — Z7902 Long term (current) use of antithrombotics/antiplatelets: Secondary | ICD-10-CM | POA: Diagnosis not present

## 2019-11-17 DIAGNOSIS — R531 Weakness: Secondary | ICD-10-CM | POA: Diagnosis not present

## 2019-11-17 DIAGNOSIS — I160 Hypertensive urgency: Secondary | ICD-10-CM | POA: Diagnosis present

## 2019-11-17 DIAGNOSIS — X58XXXD Exposure to other specified factors, subsequent encounter: Secondary | ICD-10-CM | POA: Diagnosis present

## 2019-11-17 DIAGNOSIS — I6381 Other cerebral infarction due to occlusion or stenosis of small artery: Secondary | ICD-10-CM | POA: Diagnosis present

## 2019-11-17 DIAGNOSIS — E0590 Thyrotoxicosis, unspecified without thyrotoxic crisis or storm: Secondary | ICD-10-CM | POA: Diagnosis present

## 2019-11-17 DIAGNOSIS — N179 Acute kidney failure, unspecified: Secondary | ICD-10-CM | POA: Diagnosis present

## 2019-11-17 DIAGNOSIS — E785 Hyperlipidemia, unspecified: Secondary | ICD-10-CM | POA: Diagnosis present

## 2019-11-17 DIAGNOSIS — D649 Anemia, unspecified: Secondary | ICD-10-CM | POA: Diagnosis present

## 2019-11-17 DIAGNOSIS — L89151 Pressure ulcer of sacral region, stage 1: Secondary | ICD-10-CM | POA: Diagnosis present

## 2019-11-17 DIAGNOSIS — I350 Nonrheumatic aortic (valve) stenosis: Secondary | ICD-10-CM | POA: Diagnosis not present

## 2019-11-17 DIAGNOSIS — K5900 Constipation, unspecified: Secondary | ICD-10-CM | POA: Diagnosis present

## 2019-11-17 DIAGNOSIS — M62838 Other muscle spasm: Secondary | ICD-10-CM | POA: Diagnosis not present

## 2019-11-17 DIAGNOSIS — Z79899 Other long term (current) drug therapy: Secondary | ICD-10-CM

## 2019-11-17 DIAGNOSIS — I340 Nonrheumatic mitral (valve) insufficiency: Secondary | ICD-10-CM | POA: Diagnosis not present

## 2019-11-17 DIAGNOSIS — I351 Nonrheumatic aortic (valve) insufficiency: Secondary | ICD-10-CM | POA: Diagnosis not present

## 2019-11-17 DIAGNOSIS — M47812 Spondylosis without myelopathy or radiculopathy, cervical region: Secondary | ICD-10-CM | POA: Diagnosis not present

## 2019-11-17 DIAGNOSIS — G629 Polyneuropathy, unspecified: Secondary | ICD-10-CM | POA: Diagnosis present

## 2019-11-17 DIAGNOSIS — H538 Other visual disturbances: Secondary | ICD-10-CM | POA: Diagnosis present

## 2019-11-17 DIAGNOSIS — I6523 Occlusion and stenosis of bilateral carotid arteries: Secondary | ICD-10-CM | POA: Diagnosis not present

## 2019-11-17 DIAGNOSIS — Z923 Personal history of irradiation: Secondary | ICD-10-CM | POA: Diagnosis not present

## 2019-11-17 DIAGNOSIS — R2990 Unspecified symptoms and signs involving the nervous system: Secondary | ICD-10-CM | POA: Insufficient documentation

## 2019-11-17 HISTORY — DX: Essential (primary) hypertension: I10

## 2019-11-17 LAB — URINALYSIS, ROUTINE W REFLEX MICROSCOPIC
Bilirubin Urine: NEGATIVE
Glucose, UA: NEGATIVE mg/dL
Hgb urine dipstick: NEGATIVE
Ketones, ur: NEGATIVE mg/dL
Nitrite: NEGATIVE
Protein, ur: 30 mg/dL — AB
Specific Gravity, Urine: 1.008 (ref 1.005–1.030)
pH: 6 (ref 5.0–8.0)

## 2019-11-17 LAB — CBC
HCT: 35.8 % — ABNORMAL LOW (ref 36.0–46.0)
Hemoglobin: 11.3 g/dL — ABNORMAL LOW (ref 12.0–15.0)
MCH: 29.7 pg (ref 26.0–34.0)
MCHC: 31.6 g/dL (ref 30.0–36.0)
MCV: 94.2 fL (ref 80.0–100.0)
Platelets: 234 10*3/uL (ref 150–400)
RBC: 3.8 MIL/uL — ABNORMAL LOW (ref 3.87–5.11)
RDW: 13.6 % (ref 11.5–15.5)
WBC: 6.2 10*3/uL (ref 4.0–10.5)
nRBC: 0 % (ref 0.0–0.2)

## 2019-11-17 LAB — BASIC METABOLIC PANEL
Anion gap: 10 (ref 5–15)
BUN: 22 mg/dL (ref 8–23)
CO2: 23 mmol/L (ref 22–32)
Calcium: 9.8 mg/dL (ref 8.9–10.3)
Chloride: 100 mmol/L (ref 98–111)
Creatinine, Ser: 1.1 mg/dL — ABNORMAL HIGH (ref 0.44–1.00)
GFR, Estimated: 43 mL/min — ABNORMAL LOW (ref >60)
Glucose, Bld: 108 mg/dL — ABNORMAL HIGH (ref 70–99)
Potassium: 4.4 mmol/L (ref 3.5–5.1)
Sodium: 133 mmol/L — ABNORMAL LOW (ref 135–145)

## 2019-11-17 LAB — RESPIRATORY PANEL BY RT PCR (FLU A&B, COVID)
Influenza A by PCR: NEGATIVE
Influenza B by PCR: NEGATIVE
SARS Coronavirus 2 by RT PCR: NEGATIVE

## 2019-11-17 MED ORDER — ACETAMINOPHEN 325 MG PO TABS: 650.0000 mg | ORAL_TABLET | ORAL | Status: DC | PRN

## 2019-11-17 MED ORDER — ASPIRIN 325 MG PO TABS: 325.0000 mg | ORAL_TABLET | Freq: Every day | ORAL | Status: DC

## 2019-11-17 MED ORDER — ENOXAPARIN SODIUM 30 MG/0.3ML ~~LOC~~ SOLN
30.0000 mg | SUBCUTANEOUS | Status: DC
  Administered 2019-11-17: 30 mg via SUBCUTANEOUS
  Filled 2019-11-17: qty 0.3

## 2019-11-17 MED ORDER — ASPIRIN 300 MG RE SUPP: 300.0000 mg | Freq: Every day | RECTAL | Status: DC

## 2019-11-17 MED ORDER — IOHEXOL 350 MG/ML SOLN
80.0000 mL | Freq: Once | INTRAVENOUS | Status: AC | PRN
  Administered 2019-11-17: 80 mL via INTRAVENOUS

## 2019-11-17 MED ORDER — ASPIRIN EC 81 MG PO TBEC
81.0000 mg | DELAYED_RELEASE_TABLET | Freq: Every day | ORAL | Status: DC
  Administered 2019-11-17 – 2019-11-19 (×3): 81 mg via ORAL
  Filled 2019-11-17 (×3): qty 1

## 2019-11-17 MED ORDER — STROKE: EARLY STAGES OF RECOVERY BOOK: Freq: Once | Status: AC

## 2019-11-17 MED ORDER — ACETAMINOPHEN 160 MG/5ML PO SOLN: 650.0000 mg | ORAL | Status: DC | PRN

## 2019-11-17 MED ORDER — ACETAMINOPHEN 650 MG RE SUPP: 650.0000 mg | RECTAL | Status: DC | PRN

## 2019-11-17 NOTE — H&P (Signed)
History and Physical    Cindy Smith DOB: 09-18-1923 DOA: 11/17/2019  PCP: Pcp, No  Patient coming from: Home  I have personally briefly reviewed patient's old medical records in [place]  Chief Complaint: Gait issues  HPI: Cindy Smith is a 86 y.o. female with medical history significant of hypertension, colon cancer status post colectomy, anemia of chronic disease, nontoxic multinodular goiter presents to emergency department for evaluation of difficulty walking.  Patient's daughter at the bedside is the historian.  She tells me that patient has difficulty with walking since 2 years however started getting worse since 1 week.  She has been doing very well with a rolling walker however since last Friday she started having problem with the walking and feels like her legs are heavy associated with numbness and tingling sensation.  Daughter also noticed that patient started feeling foggy and has difficulty following conversations.  No history of slurred speech, headache, lightheadedness, dizziness, head trauma, loss of consciousness, facial drop, chest pain, shortness of breath, fever, chills, cough, congestion, nausea, vomiting, abdominal pain, urinary or bowel changes.  She has history of neuropathy however does not take any medications at home. She has internal rotation of left knee since 4 years. No trauma. She lives alone and independent on daily life activities.  She uses a roller walker for ambulation.  No history of smoking, alcohol, illicit drug use.  She takes atenolol for hypertension and compliant with home medication.  She had muscle pull (upper back ) last week for which she has been taking ibuprofen as needed.  ED Course: Upon arrival to ED: Patient blood pressure was noted to be in 220s/70s, afebrile with no leukocytosis, maintaining oxygen saturation on room air, BMP shows AKI, sodium of 133, CBC shows normocytic anemia, Covid, UA: Pending,  CT head negative for acute findings.  EDP consulted neurology recommended MRI brain and cervical spine.  [facility] hospitalist consulted for admission due to strokelike symptoms and further work-up.  Review of Systems: As per HPI otherwise negative.    Past Medical History:  Diagnosis Date  . HTN (hypertension)     Past Surgical History:  Procedure Laterality Date  . COLECTOMY       reports that she has never smoked. She has never used smokeless tobacco. She reports that she does not drink alcohol and does not use drugs.  Allergies  Allergen Reactions  . Codeine     "I just pass out"    History reviewed. No pertinent family history.  Prior to Admission medications   Not on File    Physical Exam: Vitals:   11/17/19 1615 11/17/19 1630 11/17/19 1645 11/17/19 1700  BP: (!) 229/84 (!) 223/77 (!) 220/75 (!) 226/75  Pulse: (!) 59 (!) 57 61 62  Resp: 18 11 17 17   Temp:      TempSrc:      SpO2: 96% 98% 97% 97%  Weight:      Height:        Constitutional: NAD, calm, comfortable, on room air, communicating well Eyes: PERRL, lids and conjunctivae normal ENMT: Mucous membranes are moist. Posterior pharynx clear of any exudate or lesions.Normal dentition.  Neck: normal, supple, no masses, no thyromegaly Respiratory: clear to auscultation bilaterally, no wheezing, no crackles. Normal respiratory effort. No accessory muscle use.  Cardiovascular: Regular rate and rhythm, no murmurs / rubs / gallops. No extremity edema. 2+ pedal pulses. No carotid bruits.  Abdomen: no tenderness, no masses palpated. No hepatosplenomegaly. Bowel  sounds positive.  Musculoskeletal: Left knee: Internaly rotated. Skin: no rashes, lesions, ulcers. No induration Neurologic: CN 2-12 grossly intact. Power 5 out of 5 in bilateral upper extremities, 3 out of 5 in bilateral lower extremities, decreased sensation in both lower extremities. Psychiatric: Normal judgment and insight. Alert and oriented x 3. Normal  mood.    Labs on Admission: I have personally reviewed following labs and imaging studies  CBC: Recent Labs  Lab 11/17/19 1341  WBC 6.2  HGB 11.3*  HCT 35.8*  MCV 94.2  PLT 097   Basic Metabolic Panel: Recent Labs  Lab 11/17/19 1341  NA 133*  K 4.4  CL 100  CO2 23  GLUCOSE 108*  BUN 22  CREATININE 1.10*  CALCIUM 9.8   GFR: Estimated Creatinine Clearance: 25.9 mL/min (A) (by C-G formula based on SCr of 1.1 mg/dL (H)). Liver Function Tests: No results for input(s): AST, ALT, ALKPHOS, BILITOT, PROT, ALBUMIN in the last 168 hours. No results for input(s): LIPASE, AMYLASE in the last 168 hours. No results for input(s): AMMONIA in the last 168 hours. Coagulation Profile: No results for input(s): INR, PROTIME in the last 168 hours. Cardiac Enzymes: No results for input(s): CKTOTAL, CKMB, CKMBINDEX, TROPONINI in the last 168 hours. BNP (last 3 results) No results for input(s): PROBNP in the last 8760 hours. HbA1C: No results for input(s): HGBA1C in the last 72 hours. CBG: No results for input(s): GLUCAP in the last 168 hours. Lipid Profile: No results for input(s): CHOL, HDL, LDLCALC, TRIG, CHOLHDL, LDLDIRECT in the last 72 hours. Thyroid Function Tests: No results for input(s): TSH, T4TOTAL, FREET4, T3FREE, THYROIDAB in the last 72 hours. Anemia Panel: No results for input(s): VITAMINB12, FOLATE, FERRITIN, TIBC, IRON, RETICCTPCT in the last 72 hours. Urine analysis:    Component Value Date/Time   COLORURINE YELLOW 11/17/2019 1744   APPEARANCEUR HAZY (A) 11/17/2019 1744   LABSPEC 1.008 11/17/2019 1744   PHURINE 6.0 11/17/2019 1744   GLUCOSEU NEGATIVE 11/17/2019 1744   HGBUR NEGATIVE 11/17/2019 1744   [place] NEGATIVE 11/17/2019 [place] 11/17/2019 1744   PROTEINUR 30 (A) 11/17/2019 1744   NITRITE NEGATIVE 11/17/2019 1744   LEUKOCYTESUR LARGE (A) 11/17/2019 1744    Radiological Exams on Admission: CT HEAD WO CONTRAST  Result Date:  11/17/2019 CLINICAL DATA:  Acute neuro deficit.  Rule out stroke.  Weakness. EXAM: CT HEAD WITHOUT CONTRAST TECHNIQUE: Contiguous axial images were obtained from the base of the skull through the vertex without intravenous contrast. COMPARISON:  None. FINDINGS: Brain: Mild atrophy. Extensive white matter hypodensity diffusely bilaterally. Negative for acute cortical infarct. Negative for hemorrhage or mass. Vascular: Negative for hyperdense vessel Skull: Negative Sinuses/Orbits: Air-fluid level sphenoid sinus. Remaining sinuses clear. Mastoid clear bilaterally. Bilateral cataract extraction. Other: Multiple skin lesions in the scalp. Calcified dermal lesion right parietal scalp most likely Pilar cyst. IMPRESSION: Atrophy with diffuse white matter disease most likely chronic microvascular ischemia No acute abnormality. Electronically Signed   By: Franchot Gallo M.D.   On: 11/17/2019 14:52   MR BRAIN WO CONTRAST  Result Date: 11/17/2019 CLINICAL DATA:  86 year old female with acute weakness, imbalance. EXAM: MRI HEAD WITHOUT CONTRAST TECHNIQUE: Multiplanar, multiecho pulse sequences of the brain and surrounding structures were obtained without intravenous contrast. COMPARISON:  Head CT earlier today. FINDINGS: Brain: Linear, patchy restricted diffusion in the right corona radiata tracking toward the external capsule (series 5, image 75 and series 7, image 62). No other restricted diffusion. Underlying Patchy and confluent bilateral  cerebral white matter T2 and FLAIR hyperintensity. Similar signal heterogeneity throughout the bilateral basal ganglia, worse on the right. Occasional chronic micro hemorrhages (left periatrial white matter series 14, image 29). No evidence of acute hemorrhage. No mass effect. No midline shift, evidence of mass lesion, ventriculomegaly, extra-axial collection. Cervicomedullary junction and pituitary are within normal limits. Mild for age signal heterogeneity in the pons. No cortical  encephalomalacia identified. Vascular: Major intracranial vascular flow voids are preserved. Skull and upper cervical spine: Cervical spine detailed separately today. Normal visible bone marrow signal. Sinuses/Orbits: Negative, postoperative changes to both globes. Other: Mastoids are well pneumatized. Visible internal auditory structures appear normal. Scalp and face appear negative. IMPRESSION: 1. Acute small vessel infarct in the right corona radiata, external capsule. No associated hemorrhage or mass effect. 2. Underlying moderate to advanced chronic small vessel disease. Electronically Signed   By: Genevie Ann M.D.   On: 11/17/2019 18:44   MR Cervical Spine Wo Contrast  Result Date: 11/17/2019 CLINICAL DATA:  86 year old female with acute weakness, imbalance. EXAM: MRI CERVICAL SPINE WITHOUT CONTRAST TECHNIQUE: Multiplanar, multisequence MR imaging of the cervical spine was performed. No intravenous contrast was administered. COMPARISON:  Brain MRI today reported separately. FINDINGS: Alignment: Mild degenerative anterolisthesis of C2 on C3, C7 on T1. Overall straightening of cervical lordosis. Vertebrae: Widespread degenerative endplate marrow signal changes in the cervical spine. No marrow edema or evidence of acute osseous abnormality. Benign vertebral body hemangioma at T2 on the right. Cord: No cervical spinal cord signal abnormality despite multilevel degenerative stenosis with some cord mass effect detailed below. Visible upper thoracic cord appears normal. Posterior Fossa, vertebral arteries, paraspinal tissues: Cervicomedullary junction is within normal limits. Brain is detailed separately today. Preserved major vascular flow voids in the neck. Thyromegaly (series 7, image 6 on the right, but no follow-up indicated in this age group (ref: J Am Coll Radiol. 2015 Feb;12(2): 143-50). Disc levels: C2-C3: Anterolisthesis associated with moderate left and severe right facet hypertrophy. Ligament flavum  hypertrophy. No spinal stenosis. Moderate to severe bilateral C3 foraminal stenosis greater on the right. C3-C4: Severe disc space loss. Circumferential disc osteophyte complex. Mild to moderate facet and ligament flavum hypertrophy. Mild spinal stenosis. Mild if any cord mass effect. Severe bilateral C4 foraminal stenosis. C4-C5: Severe disc space loss. Circumferential disc osteophyte complex. Mild to moderate posterior element hypertrophy. Mild spinal stenosis and spinal cord mass effect. Severe bilateral C5 foraminal stenosis. C5-C6: Disc space loss with circumferential disc osteophyte complex. Mild to moderate posterior element hypertrophy. No significant spinal stenosis. Moderate left and moderate to severe right C6 foraminal stenosis. C6-C7: Mild disc space loss. Circumferential disc osteophyte complex. Moderate facet and ligament flavum hypertrophy. No significant spinal stenosis. Moderate bilateral C7 foraminal stenosis. C7-T1: Mild anterolisthesis. Mild to moderate posterior element hypertrophy. No significant stenosis. IMPRESSION: 1. No acute osseous abnormality in the cervical spine. Mild degenerative spondylolisthesis at C2-C3 and C7-T1 with facet arthropathy. 2. Widespread cervical spine degeneration. Mild spinal stenosis at C3-C4 and C4-C5 with up to mild spinal cord mass effect, but no cord signal abnormality. 3. Widespread moderate and severe degenerative neural foraminal stenosis, only sparing the C8 nerve level. Electronically Signed   By: Genevie Ann M.D.   On: 11/17/2019 18:56    EKG: Independently reviewed.  Normal sinus rhythm.  Left axis deviation.  No ST elevation or depression noted.  Assessment/Plan Principal Problem:   Ischemic stroke ([place]) Active Problems:   AKI (acute kidney injury) ([place])   Hyponatremia   Normocytic  anemia   Hypertensive urgency    Ischemic stroke: -Patient presented with difficulty walking.  CT head is negative for acute changes. MRI brain shows acute small  vessel infarct in the right coronary radiator and external capsule. -admit forTelemetry monitoring -Allow for permissive hypertension for the first 24-48h - only treat PRN if SBP >315 mmHg or diastolic blood pressure >176. Blood pressures can be gradually normalized to SBP<140 upon discharge. -Get CTA head and neck. Order transthoracic echo. -Aspirin given. -Check A1c and lipid panel. -Consult Neurology-await recommendation -PT/OT eval, Speech consult  Hypertensive urgency: -Blood pressure elevated upon arrival. Hold p.o. home medicines to allow permissive hypertension. Monitor blood pressure closely.  AKI: Likely secondary to dehydration and NSAID use.  Avoid nephrotoxic medication.  Repeat BMP tomorrow a.m.  Normocytic anemia: H&H is stable.  Continue to monitor  Hyponatremia: Mild.  Sodium 133 -Likely secondary to dehydration. -Repeat BMP tomorrow a.m.  History of colon cancer status post colectomy: aware  Asymptomatic bacteriuria: Patient's UA positive for leukocyte and bacteria. Patient denies any urinary symptoms. No indication of antibiotics at this time.  DVT prophylaxis: Lovenox/SCD Code Status: Full code-confirmed with patient's daughter Family Communication: Patient's daughter present at bedside.  Plan of care discussed with patient in length and she verbalized understanding and agreed with it. Disposition Plan: Home/SNF in 1 to 2 days Consults called: Neurology by EDP Admission status: Inpatient   Mckinley Jewel MD [facility]  If 7PM-7AM, please contact night-coverage [email or web address] Password [email or web address]  11/17/2019, 7:15 PM

## 2019-11-17 NOTE — ED Provider Notes (Signed)
[facility] EMERGENCY DEPARTMENT Provider Note   CSN: [id number] Arrival date & time: 11/17/19  1327     History No chief complaint on file.   Cindy Smith is a 86 y.o. female.  Patient is a 86 year old female who presents with difficulty controlling her legs.  History is obtained through the patient and the daughter who is at bedside.  She reportedly has had difficulty walking for the last few years.  Her left leg turns out due to a new problem and she has to walk on edge of her foot.  She has been doing pretty well with a rolling walker.  However they noticed an acute change on Friday which was 5 days ago.  At that time her daughter was over the house and she started feeling foggy like she was having difficulty following conversations.  She also noted that she was having progressively more difficulty walking.  She cannot really describe the weakness in her legs but she does say they both feel heavy.  She feels like they are not responding to her brain and she is having difficulty controlling them.  She does have numbness in both feet but she has a history of neuropathy and she is not sure if it is worse today.  She describes that it slowly been going up her legs over the last year.  She does report over the last few days that her right hand is also not as coordinated.  She does not report weakness in the arm although she feels like she is not writing normally.  She normally has impeccable writing but has not been able to write checks normally.  She does not report a headache.  No vision changes.  No difficulty with her speech.  She has some back spasms in her right mid back which she has had in the past.  She does not report any new pain.  She feels like it is exacerbated now because she is having a lean on her walker more than normal.  No recent falls or trauma.  No loss of bowel or bladder control other than she has some chronic difficulty controlling her stool.  She says  she has had a prior history of colon cancer and gangrene of the colon and has had loss of sensation that she has to have a bowel movement since that time.  She does not report any change in this or any new symptoms related to that.  No fevers or other recent illnesses.  No urinary symptoms.        No past medical history on file.  Patient Active Problem List   Diagnosis Date Noted  . AKI (acute kidney injury) ([place]) 11/17/2019  . Hyponatremia 11/17/2019  . Normocytic anemia 11/17/2019  . Hypertensive urgency 11/17/2019  . Stroke-like symptoms 11/17/2019     The histories are not reviewed yet. Please review them in the "History" navigator section and refresh this [place].   OB History   No obstetric history on file.     No family history on file.  Social History   Tobacco Use  . Smoking status: Not on file  Substance Use Topics  . Alcohol use: Not on file  . Drug use: Not on file    Home Medications Prior to Admission medications   Not on File    Allergies    Codeine  Review of Systems   Review of Systems  Constitutional: Negative for chills, diaphoresis, fatigue and fever.  HENT:  Negative for congestion, rhinorrhea and sneezing.   Eyes: Negative.   Respiratory: Negative for cough, chest tightness and shortness of breath.   Cardiovascular: Negative for chest pain and leg swelling.  Gastrointestinal: Negative for abdominal pain, blood in stool, diarrhea, nausea and vomiting.  Genitourinary: Negative for difficulty urinating, flank pain, frequency and hematuria.  Musculoskeletal: Positive for back pain. Negative for arthralgias.  Skin: Negative for rash.  Neurological: Positive for weakness and numbness. Negative for dizziness, speech difficulty and headaches.    Physical Exam Updated Vital Signs BP (!) 226/75   Pulse 62   Temp 98.5 F (36.9 C) (Oral)   Resp 17   Ht 5' (1.524 m)   Wt 60.3 kg   SpO2 97%   BMI 25.97 kg/m   Physical  Exam Constitutional:      Appearance: She is well-developed.  HENT:     Head: Normocephalic and atraumatic.  Eyes:     Pupils: Pupils are equal, round, and reactive to light.  Cardiovascular:     Rate and Rhythm: Normal rate and regular rhythm.     Heart sounds: Normal heart sounds.  Pulmonary:     Effort: Pulmonary effort is normal. No respiratory distress.     Breath sounds: Normal breath sounds. No wheezing or rales.  Chest:     Chest wall: No tenderness.  Abdominal:     General: Bowel sounds are normal.     Palpations: Abdomen is soft.     Tenderness: There is no abdominal tenderness. There is no guarding or rebound.  Musculoskeletal:        General: Normal range of motion.     Cervical back: Normal range of motion and neck supple.  Lymphadenopathy:     Cervical: No cervical adenopathy.  Skin:    General: Skin is warm and dry.     Findings: No rash.  Neurological:     Mental Status: She is alert and oriented to person, place, and time.     Comments: Patient has 5 out of 5 motor strength in the bilateral upper extremities and 4 out of 5 motor strength in the bilateral lower extremities, it appears that her patellar reflexes are intact, at least on the right, the left one is more difficult given that she has some chronic outward rotation of the leg.  She has diminished sensation to light touch in the lower extremities up to the knees.  Pedal pulses are intact.  Cranial nerves II through XII grossly intact     ED Results / Procedures / Treatments   Labs (all labs ordered are listed, but only abnormal results are displayed) Labs Reviewed  BASIC METABOLIC PANEL - Abnormal; Notable for the following components:      Result Value   Sodium 133 (*)    Glucose, Bld 108 (*)    Creatinine, Ser 1.10 (*)    GFR, Estimated 43 (*)    All other components within normal limits  CBC - Abnormal; Notable for the following components:   RBC 3.80 (*)    Hemoglobin 11.3 (*)    HCT 35.8 (*)     All other components within normal limits  RESPIRATORY PANEL BY RT PCR (FLU A&B, COVID)  URINALYSIS, ROUTINE W REFLEX MICROSCOPIC  CBG MONITORING, ED    EKG EKG Interpretation  Date/Time:  Wednesday November 17 2019 13:42:31 EDT Ventricular Rate:  72 PR Interval:  168 QRS Duration: 92 QT Interval:  396 QTC Calculation: 433 R Axis:   -33  Text Interpretation: Normal sinus rhythm Left axis deviation Minimal voltage criteria for LVH, may be normal variant ( Cornell product ) Septal infarct , age undetermined Abnormal ECG similar to prior EKG Confirmed by Malvin Johns [phone]) on 11/17/2019 3:22:41 PM   Radiology CT HEAD WO CONTRAST  Result Date: 11/17/2019 CLINICAL DATA:  Acute neuro deficit.  Rule out stroke.  Weakness. EXAM: CT HEAD WITHOUT CONTRAST TECHNIQUE: Contiguous axial images were obtained from the base of the skull through the vertex without intravenous contrast. COMPARISON:  None. FINDINGS: Brain: Mild atrophy. Extensive white matter hypodensity diffusely bilaterally. Negative for acute cortical infarct. Negative for hemorrhage or mass. Vascular: Negative for hyperdense vessel Skull: Negative Sinuses/Orbits: Air-fluid level sphenoid sinus. Remaining sinuses clear. Mastoid clear bilaterally. Bilateral cataract extraction. Other: Multiple skin lesions in the scalp. Calcified dermal lesion right parietal scalp most likely Pilar cyst. IMPRESSION: Atrophy with diffuse white matter disease most likely chronic microvascular ischemia No acute abnormality. Electronically Signed   By: Franchot Gallo M.D.   On: 11/17/2019 14:52    Procedures Procedures (including critical care time)  Medications Ordered in ED Medications - No data to display  ED Course  I have reviewed the triage vital signs and the nursing notes.  Pertinent labs & imaging results that were available during my care of the patient were reviewed by me and considered in my medical decision making (see chart for  details).    MDM Rules/Calculators/A&P                          Patient is a 86 year old female who presents with weakness. She actually does not describe his weakness but she describes it as her legs are coordinated and she is having difficulty walking. It seems to be bilateral. She has a little bit of weakness in her legs bilaterally on exam. She does have numbness with decreased sensation to light touch in both of her lower extremities. She seems to have normal sensation in her upper extremities and normal strength in her upper arms. She does report some loss of coordination in her right arm as well. It sounds like she had an acute change in the symptoms on Friday. Her labs are nonconcerning. Her creatinine is mildly increased from her prior values. Her blood pressure is elevated. It looks like she is only on metoprolol at home per her report. CT scan of her head shows no acute abnormalities. I spoke with the neurologist on-call, Dr. Lorrin Goodell who recommends getting an MRI of her brain and cervical spine given her associated arm and leg changes. I do not have a strong suspicion of Guillain-Barr as it sounds like the ascending numbness has been going on for a long time and is potentially a little bit worse but cannot get a good answer from the patient whether it is acutely worse. Sounds like more it is the coordination issues. The neurology team will see the patient. I spoke with Dr.Pahwani who will admit the patient to the hospitalist service. Final Clinical Impression(s) / ED Diagnoses Final diagnoses:  Weakness    Rx / DC Orders ED Discharge Orders    None       Malvin Johns, MD 11/17/19 1753

## 2019-11-17 NOTE — ED Triage Notes (Signed)
Since Friday, pt reports her "arms and legs won't do what my brain is telling it to do." Denies dizziness or  unilateral weakness. Hypertensive, but reports compliance with meds.

## 2019-11-18 ENCOUNTER — Inpatient Hospital Stay (HOSPITAL_COMMUNITY): Payer: Medicare Other

## 2019-11-18 DIAGNOSIS — I340 Nonrheumatic mitral (valve) insufficiency: Secondary | ICD-10-CM | POA: Diagnosis not present

## 2019-11-18 DIAGNOSIS — I350 Nonrheumatic aortic (valve) stenosis: Secondary | ICD-10-CM

## 2019-11-18 DIAGNOSIS — I639 Cerebral infarction, unspecified: Secondary | ICD-10-CM | POA: Diagnosis not present

## 2019-11-18 DIAGNOSIS — I351 Nonrheumatic aortic (valve) insufficiency: Secondary | ICD-10-CM | POA: Diagnosis not present

## 2019-11-18 LAB — BASIC METABOLIC PANEL
Anion gap: 14 (ref 5–15)
BUN: 21 mg/dL (ref 8–23)
CO2: 21 mmol/L — ABNORMAL LOW (ref 22–32)
Calcium: 9.8 mg/dL (ref 8.9–10.3)
Chloride: 100 mmol/L (ref 98–111)
Creatinine, Ser: 1.03 mg/dL — ABNORMAL HIGH (ref 0.44–1.00)
GFR, Estimated: 47 mL/min — ABNORMAL LOW (ref >60)
Glucose, Bld: 88 mg/dL (ref 70–99)
Potassium: 3.9 mmol/L (ref 3.5–5.1)
Sodium: 135 mmol/L (ref 135–145)

## 2019-11-18 LAB — ECHOCARDIOGRAM COMPLETE
AV Mean grad: 10 mmHg
Area-P 1/2: 3.85 cm2
Calc EF: 51.7 %
Height: 60 in
S' Lateral: 3.6 cm
Single Plane A2C EF: 53.8 %
Single Plane A4C EF: 47 %
Weight: 2084.67 oz

## 2019-11-18 LAB — CBC
HCT: 34.3 % — ABNORMAL LOW (ref 36.0–46.0)
Hemoglobin: 11.1 g/dL — ABNORMAL LOW (ref 12.0–15.0)
MCH: 30.8 pg (ref 26.0–34.0)
MCHC: 32.4 g/dL (ref 30.0–36.0)
MCV: 95.3 fL (ref 80.0–100.0)
Platelets: 223 10*3/uL (ref 150–400)
RBC: 3.6 MIL/uL — ABNORMAL LOW (ref 3.87–5.11)
RDW: 13.6 % (ref 11.5–15.5)
WBC: 6.5 10*3/uL (ref 4.0–10.5)
nRBC: 0 % (ref 0.0–0.2)

## 2019-11-18 LAB — LIPID PANEL
Cholesterol: 212 mg/dL — ABNORMAL HIGH (ref 0–200)
HDL: 62 mg/dL (ref >40)
LDL Cholesterol: 132 mg/dL — ABNORMAL HIGH (ref 0–99)
Total CHOL/HDL Ratio: 3.4 RATIO
Triglycerides: 90 mg/dL (ref <150)
VLDL: 18 mg/dL (ref 0–40)

## 2019-11-18 LAB — MAGNESIUM: Magnesium: 2.2 mg/dL (ref 1.7–2.4)

## 2019-11-18 MED ORDER — AMLODIPINE BESYLATE 5 MG PO TABS
5.0000 mg | ORAL_TABLET | Freq: Every day | ORAL | Status: DC
  Administered 2019-11-18: 5 mg via ORAL
  Filled 2019-11-18: qty 1

## 2019-11-18 MED ORDER — CLOPIDOGREL BISULFATE 75 MG PO TABS
300.0000 mg | ORAL_TABLET | Freq: Once | ORAL | Status: AC
  Administered 2019-11-18: 300 mg via ORAL
  Filled 2019-11-18: qty 4

## 2019-11-18 MED ORDER — CLOPIDOGREL BISULFATE 75 MG PO TABS
75.0000 mg | ORAL_TABLET | Freq: Every day | ORAL | Status: DC
  Administered 2019-11-19: 75 mg via ORAL
  Filled 2019-11-18: qty 1

## 2019-11-18 MED ORDER — LATANOPROST 0.005 % OP SOLN: 1.0000 [drp] | Freq: Every evening | OPHTHALMIC | Status: DC

## 2019-11-18 MED ORDER — HYDRALAZINE HCL 20 MG/ML IJ SOLN
10.0000 mg | INTRAMUSCULAR | Status: DC | PRN
  Administered 2019-11-18: 10 mg via INTRAVENOUS
  Filled 2019-11-18 (×3): qty 1

## 2019-11-18 MED ORDER — LATANOPROST 0.005 % OP SOLN
1.0000 [drp] | Freq: Every evening | OPHTHALMIC | Status: DC
  Administered 2019-11-18 (×2): 1 [drp] via OPHTHALMIC
  Filled 2019-11-18: qty 2.5

## 2019-11-18 MED ORDER — ATORVASTATIN CALCIUM 40 MG PO TABS
40.0000 mg | ORAL_TABLET | Freq: Every day | ORAL | Status: DC
  Administered 2019-11-18 – 2019-11-19 (×2): 40 mg via ORAL
  Filled 2019-11-18 (×2): qty 1

## 2019-11-18 NOTE — Plan of Care (Signed)

## 2019-11-18 NOTE — Progress Notes (Signed)
PT Cancellation Note  Patient Details Name: Cindy Smith MRN: [id number] DOB: August 24, 1923   Cancelled Treatment:    Reason Eval/Treat Not Completed: Medical issues which prohibited therapy patient has been persistently hypertensive  With systolic in the 761O-485T still. Per MD note, out of permissive hypertension window. Will continue to monitor and attempt to return when medically ready/if time and schedule allow.    Windell Norfolk, DPT, PN1   Supplemental Physical Therapist [facility]    Pager [phone] Acute Rehab Office [phone]

## 2019-11-18 NOTE — Progress Notes (Signed)
Patient arrived in the unit at 0215 am for [facility], A&O x4 , no s/s distress, initiated telemetry monitoring, resting in a bed at this moment, and will continue to monitor closely.

## 2019-11-18 NOTE — Consult Note (Addendum)
Neurology Consultation Reason for Consult: Stroke Referring Physician: Doristine Bosworth, R  CC: " My vision has not been right"  History is obtained from: Patient  HPI: EULAMAE GREENSTEIN is a 86 y.o. female with a history of hypertension who presents with difficulty walking as well as "blurred vision" since Friday.  Due to having difficulty with her walking, her daughter had encouraged her to come to the hospital, but the patient was hoping it would get better.  Due to it not improving, she finally sought care today and an MRI confirmed that she had had a subcortical infarct on the right.  She denies any numbness or weakness that she has noticed.  She has problems with the "ligaments" in her left leg and therefore has some problems with it at baseline, and thought that it was just her normal problems.  LKW: Friday tpa given?: no, outside window NIHSS: 1   ROS: A 14 point ROS was performed and is negative except as noted in the HPI.    Past Medical History:  Diagnosis Date  . HTN (hypertension)      History reviewed. No pertinent family history.   Social History:  reports that she has never smoked. She has never used smokeless tobacco. She reports that she does not drink alcohol and does not use drugs.   Exam: Current vital signs: BP (!) 134/42   Pulse (!) 55   Temp 98.5 F (36.9 C) (Oral)   Resp 17   Ht 5' (1.524 m)   Wt 60.3 kg   SpO2 97%   BMI 25.97 kg/m  Vital signs in last 24 hours: Temp:  [97.8 F (36.6 C)-98.5 F (36.9 C)] 98.5 F (36.9 C) (10/13 1507) Pulse Rate:  [55-79] 55 (10/14 0130) Resp:  [11-19] 17 (10/14 0130) BP: (134-235)/(42-108) 134/42 (10/14 0130) SpO2:  [95 %-99 %] 97 % (10/14 0130) Weight:  [60.3 kg] 60.3 kg (10/13 1342)   Physical Exam  Constitutional: Appears well-developed and well-nourished.  Psych: Affect appropriate to situation Eyes: No scleral injection HENT: No OP obstrucion MSK: no joint deformities.  Cardiovascular: Normal rate  and regular rhythm.  Respiratory: Effort normal, non-labored breathing GI: Soft.  No distension. There is no tenderness.  Skin: WDI  Neuro: Mental Status: Patient is awake, alert, oriented to person, place, month, year, and situation. Patient is able to give a clear and coherent history. No signs of aphasia or neglect Cranial Nerves: II: Visual Fields are full. Pupils are equal, round, and reactive to light.   III,IV, VI: EOMI without ptosis or diploplia.  V: Facial sensation is symmetric to temperature VII: Facial movement is symmetric.  VIII: hearing is intact to voice X: Uvula elevates symmetrically XI: Shoulder shrug is symmetric. XII: tongue is midline without atrophy or fasciculations.  Motor: Tone is normal. Bulk is normal. 5/5 strength was present in bilateral arms and the right leg, she has 4/5 weakness of the left leg Sensory: Sensation is symmetric to light touch and temperature in the arms and legs.  He does not extinguish to double simultaneous stimulation. Cerebellar: No clear ataxia finger-nose-finger   I have reviewed labs in epic and the results pertinent to this consultation are: BMP-sodium 133, creatinine 1.1  I have reviewed the images obtained: MRI brain-subcortical infarct on the right  Impression: 86 year old female with a history of hypertension who presents with subcortical infarct on the right.  She is being admitted for secondary risk factor modification and physical therapy.  Recommendations: - HgbA1c, fasting lipid  panel - Frequent neuro checks - Echocardiogram - Prophylactic therapy-Antiplatelet med: Aspirin -81 mg and Plavix 75 mg daily - Risk factor modification - Telemetry monitoring - PT consult, OT consult, Speech consult - Stroke team to follow  Roland Rack, MD [facility] [phone]  If 7pm- 7am, please page neurology on call as listed in [place].

## 2019-11-18 NOTE — Progress Notes (Addendum)
PROGRESS NOTE    Cindy Smith  NKN:[id number] DOB: 12-31-1923 DOA: 11/17/2019 PCP: Pcp, No   Brief Narrative:  HPI: Cindy Smith is a 86 y.o. female with medical history significant of hypertension, colon cancer status post colectomy, anemia of chronic disease, nontoxic multinodular goiter presents to emergency department for evaluation of difficulty walking.  Patient's daughter at the bedside is the historian.  She tells me that patient has difficulty with walking since 2 years however started getting worse since 1 week.  She has been doing very well with a rolling walker however since last Friday she started having problem with the walking and feels like her legs are heavy associated with numbness and tingling sensation.  Daughter also noticed that patient started feeling foggy and has difficulty following conversations.  No history of slurred speech, headache, lightheadedness, dizziness, head trauma, loss of consciousness, facial drop, chest pain, shortness of breath, fever, chills, cough, congestion, nausea, vomiting, abdominal pain, urinary or bowel changes.  She has history of neuropathy however does not take any medications at home. She has internal rotation of left knee since 4 years. No trauma. She lives alone and independent on daily life activities.  She uses a roller walker for ambulation.  No history of smoking, alcohol, illicit drug use.  She takes atenolol for hypertension and compliant with home medication.  She had muscle pull (upper back ) last week for which she has been taking ibuprofen as needed.  ED Course: Upon arrival to ED: Patient blood pressure was noted to be in 220s/70s, afebrile with no leukocytosis, maintaining oxygen saturation on room air, BMP shows AKI, sodium of 133, CBC shows normocytic anemia, Covid, UA: Pending, CT head negative for acute findings.  EDP consulted neurology recommended MRI brain and cervical spine.  [facility]  consulted for admission due to strokelike symptoms and further work-up.  Assessment & Plan:   Principal Problem:   Ischemic stroke ([place]) Active Problems:   AKI (acute kidney injury) ([place])   Hyponatremia   Normocytic anemia   Hypertensive urgency   Acute ischemic stroke: Patient presented with difficulty walking.  CT head is negative for acute changes. MRI brain shows acute small vessel infarct in the right coronary radiator and external capsule.  CTA head and neck negative for LVO.  Neurology on board.  Patient on DAPT.  Seen by PT.  Recommendation for discharging to CIR. I have notified cardiology to arrange event monitor for her.  Hypertensive urgency:  -Blood pressure elevated upon arrival.  Blood pressure elevated.  Out of window for permissive hypertension, will try to lower slowly and keep systolic less than 379 over next 24 hours.  Overnight as needed hydralazine was added.  Will start on amlodipine 5 mg p.o. daily.  Hold off to atenolol due to bradycardia.  Enlarged right thyroid lobe: We will need outpatient follow-up with ultrasound.  Hyperlipidemia: Start on atorvastatin 40 mg.  AKI: Likely secondary to dehydration and NSAID use.  Improving.  Encouraged p.o. hydration.  Normocytic anemia: H&H is stable.  Continue to monitor  Hyponatremia: Mild.  Resolved.  History of colon cancer status post colectomy: aware  Asymptomatic bacteriuria: Watch off of antibiotics.  DVT prophylaxis: SCD's Start: 11/17/19 1809   Code Status: Full Code  Family Communication: Daughter present at bedside.  Plan of care discussed with patient and with daughter in length and both verbalized understanding and agreed with it.  Status is: Inpatient  Remains inpatient appropriate because:Inpatient level of care appropriate due  to severity of illness   Dispo: The patient is from: Home              Anticipated d/c is to: CIR              Anticipated d/c date is: 1 day              Patient  currently is medically stable to d/c.        Estimated body mass index is 25.45 kg/m as calculated from the following:   Height as of this encounter: 5' (1.524 m).   Weight as of this encounter: 59.1 kg.      Nutritional status:               Consultants:   Neurology  Procedures:   None  Antimicrobials:  Anti-infectives (From admission, onward)   None         Subjective: Patient seen and examined.  Daughter at the bedside.  She states that she is feeling much better than yesterday.  No specific complaint.  Objective: Vitals:   11/18/19 0738 11/18/19 0749 11/18/19 0916 11/18/19 1151  BP: (!) 212/75 (!) 212/75 (!) 132/51 (!) 163/71  Pulse: 72 72  73  Resp: 20 18 19 18   Temp: 98.1 F (36.7 C) 98.1 F (36.7 C)    TempSrc: Oral Axillary    SpO2: 95% 95%  96%  Weight:      Height:        Intake/Output Summary (Last 24 hours) at 11/18/2019 1301 Last data filed at 11/18/2019 1010 Gross per 24 hour  Intake 360 ml  Output 750 ml  Net -390 ml   Filed Weights   11/17/19 1342 11/18/19 0331  Weight: 60.3 kg 59.1 kg    Examination:  General exam: Appears calm and comfortable  Respiratory system: Clear to auscultation. Respiratory effort normal. Cardiovascular system: S1 & S2 heard, RRR. No JVD, murmurs, rubs, gallops or clicks. No pedal edema. Gastrointestinal system: Abdomen is nondistended, soft and nontender. No organomegaly or masses felt. Normal bowel sounds heard. Central nervous system: Alert and oriented. No focal neurological deficits. Extremities: Slightly decreased strength in left lower extremity, daughter attributes this to her knee injury. Skin: No rashes, lesions or ulcers Psychiatry: Judgement and insight appear normal. Mood & affect appropriate.    Data Reviewed: I have personally reviewed following labs and imaging studies  CBC: Recent Labs  Lab 11/17/19 1341 11/18/19 0007  WBC 6.2 6.5  HGB 11.3* 11.1*  HCT 35.8* 34.3*    MCV 94.2 95.3  PLT 234 193   Basic Metabolic Panel: Recent Labs  Lab 11/17/19 1341 11/18/19 0440  NA 133* 135  K 4.4 3.9  CL 100 100  CO2 23 21*  GLUCOSE 108* 88  BUN 22 21  CREATININE 1.10* 1.03*  CALCIUM 9.8 9.8  MG  --  2.2   GFR: Estimated Creatinine Clearance: 27.4 mL/min (A) (by C-G formula based on SCr of 1.03 mg/dL (H)). Liver Function Tests: No results for input(s): AST, ALT, ALKPHOS, BILITOT, PROT, ALBUMIN in the last 168 hours. No results for input(s): LIPASE, AMYLASE in the last 168 hours. No results for input(s): AMMONIA in the last 168 hours. Coagulation Profile: No results for input(s): INR, PROTIME in the last 168 hours. Cardiac Enzymes: No results for input(s): CKTOTAL, CKMB, CKMBINDEX, TROPONINI in the last 168 hours. BNP (last 3 results) No results for input(s): PROBNP in the last 8760 hours. HbA1C: No results for input(s): HGBA1C in  the last 72 hours. CBG: No results for input(s): GLUCAP in the last 168 hours. Lipid Profile: Recent Labs    11/18/19 0440  CHOL 212*  HDL 62  LDLCALC 132*  TRIG 90  CHOLHDL 3.4   Thyroid Function Tests: No results for input(s): TSH, T4TOTAL, FREET4, T3FREE, THYROIDAB in the last 72 hours. Anemia Panel: No results for input(s): VITAMINB12, FOLATE, FERRITIN, TIBC, IRON, RETICCTPCT in the last 72 hours. Sepsis Labs: No results for input(s): PROCALCITON, LATICACIDVEN in the last 168 hours.  Recent Results (from the past 240 hour(s))  Respiratory Panel by RT PCR (Flu A&B, Covid) - Nasopharyngeal Swab     Status: None   Collection Time: 11/17/19  5:32 PM   Specimen: Nasopharyngeal Swab  Result Value Ref Range Status   SARS Coronavirus 2 by RT PCR NEGATIVE NEGATIVE Final    Comment: (NOTE) SARS-CoV-2 target nucleic acids are NOT DETECTED.  The SARS-CoV-2 RNA is generally detectable in upper respiratoy specimens during the acute phase of infection. The lowest concentration of SARS-CoV-2 viral copies this assay  can detect is 131 copies/mL. A negative result does not preclude SARS-Cov-2 infection and should not be used as the sole basis for treatment or other patient management decisions. A negative result may occur with  improper specimen collection/handling, submission of specimen other than nasopharyngeal swab, presence of viral mutation(s) within the areas targeted by this assay, and inadequate number of viral copies (<131 copies/mL). A negative result must be combined with clinical observations, patient history, and epidemiological information. The expected result is Negative.  Fact Sheet for Patients:  PinkCheek.be  Fact Sheet for Healthcare Providers:  GravelBags.it  This test is no t yet approved or cleared by the [place] FDA and  has been authorized for detection and/or diagnosis of SARS-CoV-2 by FDA under an Emergency Use Authorization (EUA). This EUA will remain  in effect (meaning this test can be used) for the duration of the COVID-19 declaration under Section 564(b)(1) of the Act, 21 U.S.C. section 360bbb-3(b)(1), unless the authorization is terminated or revoked sooner.     Influenza A by PCR NEGATIVE NEGATIVE Final   Influenza B by PCR NEGATIVE NEGATIVE Final    Comment: (NOTE) The Xpert Xpress SARS-CoV-2/FLU/RSV assay is intended as an aid in  the diagnosis of influenza from Nasopharyngeal swab specimens and  should not be used as a sole basis for treatment. Nasal washings and  aspirates are unacceptable for Xpert Xpress SARS-CoV-2/FLU/RSV  testing.  Fact Sheet for Patients: PinkCheek.be  Fact Sheet for Healthcare Providers: GravelBags.it  This test is not yet approved or cleared by the [place] FDA and  has been authorized for detection and/or diagnosis of SARS-CoV-2 by  FDA under an Emergency Use Authorization (EUA). This EUA will remain    in effect (meaning this test can be used) for the duration of the  Covid-19 declaration under Section 564(b)(1) of the Act, 21  U.S.C. section 360bbb-3(b)(1), unless the authorization is  terminated or revoked. Performed at [facility], [place] [address] [place], [place] [address]       Radiology Studies: CT HEAD WO CONTRAST  Result Date: 11/17/2019 CLINICAL DATA:  Acute neuro deficit.  Rule out stroke.  Weakness. EXAM: CT HEAD WITHOUT CONTRAST TECHNIQUE: Contiguous axial images were obtained from the base of the skull through the vertex without intravenous contrast. COMPARISON:  None. FINDINGS: Brain: Mild atrophy. Extensive white matter hypodensity diffusely bilaterally. Negative for acute cortical infarct. Negative for hemorrhage or mass.  Vascular: Negative for hyperdense vessel Skull: Negative Sinuses/Orbits: Air-fluid level sphenoid sinus. Remaining sinuses clear. Mastoid clear bilaterally. Bilateral cataract extraction. Other: Multiple skin lesions in the scalp. Calcified dermal lesion right parietal scalp most likely Pilar cyst. IMPRESSION: Atrophy with diffuse white matter disease most likely chronic microvascular ischemia No acute abnormality. Electronically Signed   By: Franchot Gallo M.D.   On: 11/17/2019 14:52   MR BRAIN WO CONTRAST  Result Date: 11/17/2019 CLINICAL DATA:  86 year old female with acute weakness, imbalance. EXAM: MRI HEAD WITHOUT CONTRAST TECHNIQUE: Multiplanar, multiecho pulse sequences of the brain and surrounding structures were obtained without intravenous contrast. COMPARISON:  Head CT earlier today. FINDINGS: Brain: Linear, patchy restricted diffusion in the right corona radiata tracking toward the external capsule (series 5, image 75 and series 7, image 62). No other restricted diffusion. Underlying Patchy and confluent bilateral cerebral white matter T2 and FLAIR hyperintensity. Similar signal heterogeneity throughout the bilateral basal ganglia, worse on  the right. Occasional chronic micro hemorrhages (left periatrial white matter series 14, image 29). No evidence of acute hemorrhage. No mass effect. No midline shift, evidence of mass lesion, ventriculomegaly, extra-axial collection. Cervicomedullary junction and pituitary are within normal limits. Mild for age signal heterogeneity in the pons. No cortical encephalomalacia identified. Vascular: Major intracranial vascular flow voids are preserved. Skull and upper cervical spine: Cervical spine detailed separately today. Normal visible bone marrow signal. Sinuses/Orbits: Negative, postoperative changes to both globes. Other: Mastoids are well pneumatized. Visible internal auditory structures appear normal. Scalp and face appear negative. IMPRESSION: 1. Acute small vessel infarct in the right corona radiata, external capsule. No associated hemorrhage or mass effect. 2. Underlying moderate to advanced chronic small vessel disease. Electronically Signed   By: Genevie Ann M.D.   On: 11/17/2019 18:44   MR Cervical Spine Wo Contrast  Result Date: 11/17/2019 CLINICAL DATA:  86 year old female with acute weakness, imbalance. EXAM: MRI CERVICAL SPINE WITHOUT CONTRAST TECHNIQUE: Multiplanar, multisequence MR imaging of the cervical spine was performed. No intravenous contrast was administered. COMPARISON:  Brain MRI today reported separately. FINDINGS: Alignment: Mild degenerative anterolisthesis of C2 on C3, C7 on T1. Overall straightening of cervical lordosis. Vertebrae: Widespread degenerative endplate marrow signal changes in the cervical spine. No marrow edema or evidence of acute osseous abnormality. Benign vertebral body hemangioma at T2 on the right. Cord: No cervical spinal cord signal abnormality despite multilevel degenerative stenosis with some cord mass effect detailed below. Visible upper thoracic cord appears normal. Posterior Fossa, vertebral arteries, paraspinal tissues: Cervicomedullary junction is within  normal limits. Brain is detailed separately today. Preserved major vascular flow voids in the neck. Thyromegaly (series 7, image 6 on the right, but no follow-up indicated in this age group (ref: J Am Coll Radiol. 2015 Feb;12(2): 143-50). Disc levels: C2-C3: Anterolisthesis associated with moderate left and severe right facet hypertrophy. Ligament flavum hypertrophy. No spinal stenosis. Moderate to severe bilateral C3 foraminal stenosis greater on the right. C3-C4: Severe disc space loss. Circumferential disc osteophyte complex. Mild to moderate facet and ligament flavum hypertrophy. Mild spinal stenosis. Mild if any cord mass effect. Severe bilateral C4 foraminal stenosis. C4-C5: Severe disc space loss. Circumferential disc osteophyte complex. Mild to moderate posterior element hypertrophy. Mild spinal stenosis and spinal cord mass effect. Severe bilateral C5 foraminal stenosis. C5-C6: Disc space loss with circumferential disc osteophyte complex. Mild to moderate posterior element hypertrophy. No significant spinal stenosis. Moderate left and moderate to severe right C6 foraminal stenosis. C6-C7: Mild disc space loss. Circumferential disc osteophyte  complex. Moderate facet and ligament flavum hypertrophy. No significant spinal stenosis. Moderate bilateral C7 foraminal stenosis. C7-T1: Mild anterolisthesis. Mild to moderate posterior element hypertrophy. No significant stenosis. IMPRESSION: 1. No acute osseous abnormality in the cervical spine. Mild degenerative spondylolisthesis at C2-C3 and C7-T1 with facet arthropathy. 2. Widespread cervical spine degeneration. Mild spinal stenosis at C3-C4 and C4-C5 with up to mild spinal cord mass effect, but no cord signal abnormality. 3. Widespread moderate and severe degenerative neural foraminal stenosis, only sparing the C8 nerve level. Electronically Signed   By: Genevie Ann M.D.   On: 11/17/2019 18:56   CT ANGIO HEAD CODE STROKE  Result Date: 11/17/2019 CLINICAL DATA:   TIA EXAM: CT ANGIOGRAPHY HEAD AND NECK TECHNIQUE: Multidetector CT imaging of the head and neck was performed using the standard protocol during bolus administration of intravenous contrast. Multiplanar CT image reconstructions and MIPs were obtained to evaluate the vascular anatomy. Carotid stenosis measurements (when applicable) are obtained utilizing NASCET criteria, using the distal internal carotid diameter as the denominator. CONTRAST:  101mL OMNIPAQUE IOHEXOL 350 MG/ML SOLN COMPARISON:  None. FINDINGS: CTA NECK FINDINGS SKELETON: There is no bony spinal canal stenosis. No lytic or blastic lesion. OTHER NECK: Heterogeneous and enlarged right thyroid lobe measuring 3 1 x 2.5 cm. UPPER CHEST: Biapical emphysema AORTIC ARCH: There is calcific atherosclerosis of the aortic arch. There is no aneurysm, dissection or hemodynamically significant stenosis of the visualized portion of the aorta. Conventional 3 vessel aortic branching pattern. The visualized proximal subclavian arteries are widely patent. RIGHT CAROTID SYSTEM: No dissection, occlusion or aneurysm. There is predominantly calcified atherosclerosis extending into the proximal ICA, resulting in 50% stenosis. LEFT CAROTID SYSTEM: No dissection, occlusion or aneurysm. Mild atherosclerotic calcification at the carotid bifurcation without hemodynamically significant stenosis. VERTEBRAL ARTERIES: Codominant configuration. Both origins are clearly patent. There is no dissection, occlusion or flow-limiting stenosis to the skull base (V1-V3 segments). CTA HEAD FINDINGS POSTERIOR CIRCULATION: --Vertebral arteries: Normal V4 segments. --Inferior cerebellar arteries: Normal. --Basilar artery: Normal. --Superior cerebellar arteries: Normal. --Posterior cerebral arteries (PCA): Normal. ANTERIOR CIRCULATION: --Intracranial internal carotid arteries: Atherosclerotic calcification of the internal carotid arteries at the skull base without hemodynamically significant  stenosis. --Anterior cerebral arteries (ACA): Normal. Both A1 segments are present. Patent anterior communicating artery (a-comm). --Middle cerebral arteries (MCA): Normal. VENOUS SINUSES: As permitted by contrast timing, patent. ANATOMIC VARIANTS: None Review of the MIP images confirms the above findings. IMPRESSION: 1. No intracranial arterial occlusion or high-grade stenosis. 2. Approximately 50% stenosis of the proximal right internal carotid artery secondary to predominantly calcified atherosclerosis. 3. Heterogeneous and enlarged right thyroid lobe measuring 3 1 x 2.5 cm. Recommend thyroid ultrasound (ref: J Am Coll Radiol. 2015 Feb;12(2): 143-50). Aortic Atherosclerosis (ICD10-I70.0) and Emphysema (ICD10-J43.9). Electronically Signed   By: Ulyses Jarred M.D.   On: 11/17/2019 21:51   CT ANGIO NECK CODE STROKE  Result Date: 11/17/2019 CLINICAL DATA:  TIA EXAM: CT ANGIOGRAPHY HEAD AND NECK TECHNIQUE: Multidetector CT imaging of the head and neck was performed using the standard protocol during bolus administration of intravenous contrast. Multiplanar CT image reconstructions and MIPs were obtained to evaluate the vascular anatomy. Carotid stenosis measurements (when applicable) are obtained utilizing NASCET criteria, using the distal internal carotid diameter as the denominator. CONTRAST:  36mL OMNIPAQUE IOHEXOL 350 MG/ML SOLN COMPARISON:  None. FINDINGS: CTA NECK FINDINGS SKELETON: There is no bony spinal canal stenosis. No lytic or blastic lesion. OTHER NECK: Heterogeneous and enlarged right thyroid lobe measuring 3 1 x 2.5 cm.  UPPER CHEST: Biapical emphysema AORTIC ARCH: There is calcific atherosclerosis of the aortic arch. There is no aneurysm, dissection or hemodynamically significant stenosis of the visualized portion of the aorta. Conventional 3 vessel aortic branching pattern. The visualized proximal subclavian arteries are widely patent. RIGHT CAROTID SYSTEM: No dissection, occlusion or aneurysm.  There is predominantly calcified atherosclerosis extending into the proximal ICA, resulting in 50% stenosis. LEFT CAROTID SYSTEM: No dissection, occlusion or aneurysm. Mild atherosclerotic calcification at the carotid bifurcation without hemodynamically significant stenosis. VERTEBRAL ARTERIES: Codominant configuration. Both origins are clearly patent. There is no dissection, occlusion or flow-limiting stenosis to the skull base (V1-V3 segments). CTA HEAD FINDINGS POSTERIOR CIRCULATION: --Vertebral arteries: Normal V4 segments. --Inferior cerebellar arteries: Normal. --Basilar artery: Normal. --Superior cerebellar arteries: Normal. --Posterior cerebral arteries (PCA): Normal. ANTERIOR CIRCULATION: --Intracranial internal carotid arteries: Atherosclerotic calcification of the internal carotid arteries at the skull base without hemodynamically significant stenosis. --Anterior cerebral arteries (ACA): Normal. Both A1 segments are present. Patent anterior communicating artery (a-comm). --Middle cerebral arteries (MCA): Normal. VENOUS SINUSES: As permitted by contrast timing, patent. ANATOMIC VARIANTS: None Review of the MIP images confirms the above findings. IMPRESSION: 1. No intracranial arterial occlusion or high-grade stenosis. 2. Approximately 50% stenosis of the proximal right internal carotid artery secondary to predominantly calcified atherosclerosis. 3. Heterogeneous and enlarged right thyroid lobe measuring 3 1 x 2.5 cm. Recommend thyroid ultrasound (ref: J Am Coll Radiol. 2015 Feb;12(2): 143-50). Aortic Atherosclerosis (ICD10-I70.0) and Emphysema (ICD10-J43.9). Electronically Signed   By: Ulyses Jarred M.D.   On: 11/17/2019 21:51    Scheduled Meds: . amLODipine  5 mg Oral Daily  . aspirin EC  81 mg Oral Daily  . atorvastatin  40 mg Oral Daily  . [START ON 11/19/2019] clopidogrel  75 mg Oral Daily  . latanoprost  1 drop Both Eyes QPM   Continuous Infusions:   LOS: 1 day   Time spent: 35  minutes   Darliss Cheney, MD [facility]  11/18/2019, 1:01 PM   To contact the attending provider between 7A-7P or the covering provider during after hours 7P-7A, please log into the web site [email or web address].

## 2019-11-18 NOTE — Evaluation (Signed)
Speech Language Pathology Evaluation Patient Details Name: Cindy Smith MRN: [id number] DOB: [birth year] Today's Date: 11/18/2019 Time: 2440-1027 SLP Time Calculation (min) (ACUTE ONLY): 17 min  Problem List:  Patient Active Problem List   Diagnosis Date Noted  . AKI (acute kidney injury) ([place]) 11/17/2019  . Hyponatremia 11/17/2019  . Normocytic anemia 11/17/2019  . Hypertensive urgency 11/17/2019  . Stroke-like symptoms 11/17/2019  . Ischemic stroke ([place]) 11/17/2019   Past Medical History:  Past Medical History:  Diagnosis Date  . HTN (hypertension)    Past Surgical History:  Past Surgical History:  Procedure Laterality Date  . COLECTOMY     HPI:  DELVINA Smith is a 86 y.o. female with medical history significant of hypertension, colon cancer status post colectomy, anemia of chronic disease, nontoxic multinodular goiter presents to emergency department for evaluation of difficulty walking and blurred vision. MRI brain shows acute small vessel infarct in the right coronar radiata and external capsule.   Assessment / Plan / Recommendation Clinical Impression  Pt was seen for SLE and was alert and cooperative. The SLUMS was administered and she acheived a score of 28/30. Pt demonstrated some mild difficulty with recall of new information, but accuracy increased with min verbal cues. All other areas of cognition assessed appear intact. Pt's language was also observed to be intact. Pt and daughter report that they have no concerns. SLP will sign off.     SLP Assessment  SLP Recommendation/Assessment: Patient does not need any further Speech Lanaguage Pathology Services SLP Visit Diagnosis: Cognitive communication deficit (R41.841)    Follow Up Recommendations       Frequency and Duration           SLP Evaluation Cognition  Overall Cognitive Status: Within Functional Limits for tasks assessed Arousal/Alertness: Awake/alert Orientation Level: Oriented  X4 Attention: Focused;Sustained;Selective Focused Attention: Appears intact Sustained Attention: Appears intact Selective Attention: Appears intact Memory: Appears intact Awareness: Appears intact Problem Solving: Appears intact Executive Function: Organizing;Decision Making;Sequencing;Reasoning Reasoning: Appears intact Sequencing: Appears intact Organizing: Appears intact Decision Making: Appears intact Safety/Judgment: Appears intact       Comprehension  Auditory Comprehension Overall Auditory Comprehension: Appears within functional limits for tasks assessed Yes/No Questions: Within Functional Limits Commands: Within Functional Limits Conversation: Simple Visual Recognition/Discrimination Discrimination: Not tested Reading Comprehension Reading Status: Not tested    Expression Expression Primary Mode of Expression: Verbal Verbal Expression Overall Verbal Expression: Appears within functional limits for tasks assessed Initiation: No impairment Automatic Speech: Name;Social Response;Day of week Level of Generative/Spontaneous Verbalization: Conversation;Sentence;Phrase;Word Written Expression Dominant Hand: Right Written Expression: Within Functional Limits   Oral / Motor  Motor Speech Overall Motor Speech: Appears within functional limits for tasks assessed Respiration: Within functional limits Phonation: Normal Resonance: Within functional limits Articulation: Within functional limitis Intelligibility: Intelligible Motor Planning: Witnin functional limits Motor Speech Errors: Not applicable   GO                    Cindy Smith 11/18/2019, 10:44 AM

## 2019-11-18 NOTE — Progress Notes (Signed)
  Echocardiogram 2D Echocardiogram has been performed.  Geoffery Lyons Swaim 11/18/2019, 1:53 PM

## 2019-11-18 NOTE — Progress Notes (Signed)
STROKE TEAM PROGRESS NOTE   INTERVAL HISTORY Her daughter is at the bedside.  I have reviewed history of presenting illness with the patient and daughter, electronic medical records and imaging films in PACS.  She presented with 3-day history of gait and balance difficulties But denies any focal weakness or numbness.  MRI scan of the brain shows a fairly large 2 cm right basal ganglia infarct.  CT angiogram shows 50% right ICA stenosis.  Incidental thyroid nodule is noted.  LDL cholesterol is 132 mg percent.  Echocardiogram is pending. Vitals:   11/18/19 0331 11/18/19 0450 11/18/19 0749 11/18/19 0916  BP:  (!) 177/53 (!) 212/75 (!) 132/51  Pulse: 60 (!) 59 72   Resp: 18  18 19   Temp: 98 F (36.7 C) 98.7 F (37.1 C) 98.1 F (36.7 C)   TempSrc: Oral Oral Axillary   SpO2: 98% 97% 95%   Weight: 59.1 kg     Height: 5' (1.524 m)      CBC:  Recent Labs  Lab 11/17/19 1341 11/18/19 0007  WBC 6.2 6.5  HGB 11.3* 11.1*  HCT 35.8* 34.3*  MCV 94.2 95.3  PLT 234 767   Basic Metabolic Panel:  Recent Labs  Lab 11/17/19 1341 11/18/19 0440  NA 133* 135  K 4.4 3.9  CL 100 100  CO2 23 21*  GLUCOSE 108* 88  BUN 22 21  CREATININE 1.10* 1.03*  CALCIUM 9.8 9.8  MG  --  2.2   Lipid Panel:  Recent Labs  Lab 11/18/19 0440  CHOL 212*  TRIG 90  HDL 62  CHOLHDL 3.4  VLDL 18  LDLCALC 132*   HgbA1c: No results for input(s): HGBA1C in the last 168 hours. Urine Drug Screen: No results for input(s): LABOPIA, COCAINSCRNUR, LABBENZ, AMPHETMU, THCU, LABBARB in the last 168 hours.  Alcohol Level No results for input(s): ETH in the last 168 hours.  IMAGING past 24 hours CT HEAD WO CONTRAST  Result Date: 11/17/2019 CLINICAL DATA:  Acute neuro deficit.  Rule out stroke.  Weakness. EXAM: CT HEAD WITHOUT CONTRAST TECHNIQUE: Contiguous axial images were obtained from the base of the skull through the vertex without intravenous contrast. COMPARISON:  None. FINDINGS: Brain: Mild atrophy. Extensive  white matter hypodensity diffusely bilaterally. Negative for acute cortical infarct. Negative for hemorrhage or mass. Vascular: Negative for hyperdense vessel Skull: Negative Sinuses/Orbits: Air-fluid level sphenoid sinus. Remaining sinuses clear. Mastoid clear bilaterally. Bilateral cataract extraction. Other: Multiple skin lesions in the scalp. Calcified dermal lesion right parietal scalp most likely Pilar cyst. IMPRESSION: Atrophy with diffuse white matter disease most likely chronic microvascular ischemia No acute abnormality. Electronically Signed   By: Franchot Gallo M.D.   On: 11/17/2019 14:52   MR BRAIN WO CONTRAST  Result Date: 11/17/2019 CLINICAL DATA:  86 year old female with acute weakness, imbalance. EXAM: MRI HEAD WITHOUT CONTRAST TECHNIQUE: Multiplanar, multiecho pulse sequences of the brain and surrounding structures were obtained without intravenous contrast. COMPARISON:  Head CT earlier today. FINDINGS: Brain: Linear, patchy restricted diffusion in the right corona radiata tracking toward the external capsule (series 5, image 75 and series 7, image 62). No other restricted diffusion. Underlying Patchy and confluent bilateral cerebral white matter T2 and FLAIR hyperintensity. Similar signal heterogeneity throughout the bilateral basal ganglia, worse on the right. Occasional chronic micro hemorrhages (left periatrial white matter series 14, image 29). No evidence of acute hemorrhage. No mass effect. No midline shift, evidence of mass lesion, ventriculomegaly, extra-axial collection. Cervicomedullary junction and pituitary are within  normal limits. Mild for age signal heterogeneity in the pons. No cortical encephalomalacia identified. Vascular: Major intracranial vascular flow voids are preserved. Skull and upper cervical spine: Cervical spine detailed separately today. Normal visible bone marrow signal. Sinuses/Orbits: Negative, postoperative changes to both globes. Other: Mastoids are well  pneumatized. Visible internal auditory structures appear normal. Scalp and face appear negative. IMPRESSION: 1. Acute small vessel infarct in the right corona radiata, external capsule. No associated hemorrhage or mass effect. 2. Underlying moderate to advanced chronic small vessel disease. Electronically Signed   By: Genevie Ann M.D.   On: 11/17/2019 18:44   MR Cervical Spine Wo Contrast  Result Date: 11/17/2019 CLINICAL DATA:  86 year old female with acute weakness, imbalance. EXAM: MRI CERVICAL SPINE WITHOUT CONTRAST TECHNIQUE: Multiplanar, multisequence MR imaging of the cervical spine was performed. No intravenous contrast was administered. COMPARISON:  Brain MRI today reported separately. FINDINGS: Alignment: Mild degenerative anterolisthesis of C2 on C3, C7 on T1. Overall straightening of cervical lordosis. Vertebrae: Widespread degenerative endplate marrow signal changes in the cervical spine. No marrow edema or evidence of acute osseous abnormality. Benign vertebral body hemangioma at T2 on the right. Cord: No cervical spinal cord signal abnormality despite multilevel degenerative stenosis with some cord mass effect detailed below. Visible upper thoracic cord appears normal. Posterior Fossa, vertebral arteries, paraspinal tissues: Cervicomedullary junction is within normal limits. Brain is detailed separately today. Preserved major vascular flow voids in the neck. Thyromegaly (series 7, image 6 on the right, but no follow-up indicated in this age group (ref: J Am Coll Radiol. 2015 Feb;12(2): 143-50). Disc levels: C2-C3: Anterolisthesis associated with moderate left and severe right facet hypertrophy. Ligament flavum hypertrophy. No spinal stenosis. Moderate to severe bilateral C3 foraminal stenosis greater on the right. C3-C4: Severe disc space loss. Circumferential disc osteophyte complex. Mild to moderate facet and ligament flavum hypertrophy. Mild spinal stenosis. Mild if any cord mass effect. Severe  bilateral C4 foraminal stenosis. C4-C5: Severe disc space loss. Circumferential disc osteophyte complex. Mild to moderate posterior element hypertrophy. Mild spinal stenosis and spinal cord mass effect. Severe bilateral C5 foraminal stenosis. C5-C6: Disc space loss with circumferential disc osteophyte complex. Mild to moderate posterior element hypertrophy. No significant spinal stenosis. Moderate left and moderate to severe right C6 foraminal stenosis. C6-C7: Mild disc space loss. Circumferential disc osteophyte complex. Moderate facet and ligament flavum hypertrophy. No significant spinal stenosis. Moderate bilateral C7 foraminal stenosis. C7-T1: Mild anterolisthesis. Mild to moderate posterior element hypertrophy. No significant stenosis. IMPRESSION: 1. No acute osseous abnormality in the cervical spine. Mild degenerative spondylolisthesis at C2-C3 and C7-T1 with facet arthropathy. 2. Widespread cervical spine degeneration. Mild spinal stenosis at C3-C4 and C4-C5 with up to mild spinal cord mass effect, but no cord signal abnormality. 3. Widespread moderate and severe degenerative neural foraminal stenosis, only sparing the C8 nerve level. Electronically Signed   By: Genevie Ann M.D.   On: 11/17/2019 18:56   CT ANGIO HEAD CODE STROKE  Result Date: 11/17/2019 CLINICAL DATA:  TIA EXAM: CT ANGIOGRAPHY HEAD AND NECK TECHNIQUE: Multidetector CT imaging of the head and neck was performed using the standard protocol during bolus administration of intravenous contrast. Multiplanar CT image reconstructions and MIPs were obtained to evaluate the vascular anatomy. Carotid stenosis measurements (when applicable) are obtained utilizing NASCET criteria, using the distal internal carotid diameter as the denominator. CONTRAST:  4mL OMNIPAQUE IOHEXOL 350 MG/ML SOLN COMPARISON:  None. FINDINGS: CTA NECK FINDINGS SKELETON: There is no bony spinal canal stenosis. No lytic or  blastic lesion. OTHER NECK: Heterogeneous and enlarged  right thyroid lobe measuring 3 1 x 2.5 cm. UPPER CHEST: Biapical emphysema AORTIC ARCH: There is calcific atherosclerosis of the aortic arch. There is no aneurysm, dissection or hemodynamically significant stenosis of the visualized portion of the aorta. Conventional 3 vessel aortic branching pattern. The visualized proximal subclavian arteries are widely patent. RIGHT CAROTID SYSTEM: No dissection, occlusion or aneurysm. There is predominantly calcified atherosclerosis extending into the proximal ICA, resulting in 50% stenosis. LEFT CAROTID SYSTEM: No dissection, occlusion or aneurysm. Mild atherosclerotic calcification at the carotid bifurcation without hemodynamically significant stenosis. VERTEBRAL ARTERIES: Codominant configuration. Both origins are clearly patent. There is no dissection, occlusion or flow-limiting stenosis to the skull base (V1-V3 segments). CTA HEAD FINDINGS POSTERIOR CIRCULATION: --Vertebral arteries: Normal V4 segments. --Inferior cerebellar arteries: Normal. --Basilar artery: Normal. --Superior cerebellar arteries: Normal. --Posterior cerebral arteries (PCA): Normal. ANTERIOR CIRCULATION: --Intracranial internal carotid arteries: Atherosclerotic calcification of the internal carotid arteries at the skull base without hemodynamically significant stenosis. --Anterior cerebral arteries (ACA): Normal. Both A1 segments are present. Patent anterior communicating artery (a-comm). --Middle cerebral arteries (MCA): Normal. VENOUS SINUSES: As permitted by contrast timing, patent. ANATOMIC VARIANTS: None Review of the MIP images confirms the above findings. IMPRESSION: 1. No intracranial arterial occlusion or high-grade stenosis. 2. Approximately 50% stenosis of the proximal right internal carotid artery secondary to predominantly calcified atherosclerosis. 3. Heterogeneous and enlarged right thyroid lobe measuring 3 1 x 2.5 cm. Recommend thyroid ultrasound (ref: J Am Coll Radiol. 2015 Feb;12(2):  143-50). Aortic Atherosclerosis (ICD10-I70.0) and Emphysema (ICD10-J43.9). Electronically Signed   By: Ulyses Jarred M.D.   On: 11/17/2019 21:51   CT ANGIO NECK CODE STROKE  Result Date: 11/17/2019 CLINICAL DATA:  TIA EXAM: CT ANGIOGRAPHY HEAD AND NECK TECHNIQUE: Multidetector CT imaging of the head and neck was performed using the standard protocol during bolus administration of intravenous contrast. Multiplanar CT image reconstructions and MIPs were obtained to evaluate the vascular anatomy. Carotid stenosis measurements (when applicable) are obtained utilizing NASCET criteria, using the distal internal carotid diameter as the denominator. CONTRAST:  18mL OMNIPAQUE IOHEXOL 350 MG/ML SOLN COMPARISON:  None. FINDINGS: CTA NECK FINDINGS SKELETON: There is no bony spinal canal stenosis. No lytic or blastic lesion. OTHER NECK: Heterogeneous and enlarged right thyroid lobe measuring 3 1 x 2.5 cm. UPPER CHEST: Biapical emphysema AORTIC ARCH: There is calcific atherosclerosis of the aortic arch. There is no aneurysm, dissection or hemodynamically significant stenosis of the visualized portion of the aorta. Conventional 3 vessel aortic branching pattern. The visualized proximal subclavian arteries are widely patent. RIGHT CAROTID SYSTEM: No dissection, occlusion or aneurysm. There is predominantly calcified atherosclerosis extending into the proximal ICA, resulting in 50% stenosis. LEFT CAROTID SYSTEM: No dissection, occlusion or aneurysm. Mild atherosclerotic calcification at the carotid bifurcation without hemodynamically significant stenosis. VERTEBRAL ARTERIES: Codominant configuration. Both origins are clearly patent. There is no dissection, occlusion or flow-limiting stenosis to the skull base (V1-V3 segments). CTA HEAD FINDINGS POSTERIOR CIRCULATION: --Vertebral arteries: Normal V4 segments. --Inferior cerebellar arteries: Normal. --Basilar artery: Normal. --Superior cerebellar arteries: Normal. --Posterior  cerebral arteries (PCA): Normal. ANTERIOR CIRCULATION: --Intracranial internal carotid arteries: Atherosclerotic calcification of the internal carotid arteries at the skull base without hemodynamically significant stenosis. --Anterior cerebral arteries (ACA): Normal. Both A1 segments are present. Patent anterior communicating artery (a-comm). --Middle cerebral arteries (MCA): Normal. VENOUS SINUSES: As permitted by contrast timing, patent. ANATOMIC VARIANTS: None Review of the MIP images confirms the above findings. IMPRESSION: 1. No intracranial arterial occlusion  or high-grade stenosis. 2. Approximately 50% stenosis of the proximal right internal carotid artery secondary to predominantly calcified atherosclerosis. 3. Heterogeneous and enlarged right thyroid lobe measuring 3 1 x 2.5 cm. Recommend thyroid ultrasound (ref: J Am Coll Radiol. 2015 Feb;12(2): 143-50). Aortic Atherosclerosis (ICD10-I70.0) and Emphysema (ICD10-J43.9). Electronically Signed   By: Ulyses Jarred M.D.   On: 11/17/2019 21:51    PHYSICAL EXAM Pleasant elderly Caucasian lady not in distress. . Afebrile. Head is nontraumatic. Neck is supple without bruit.    Cardiac exam no murmur or gallop. Lungs are clear to auscultation. Distal pulses are well felt. Neurological Exam ;  Awake  Alert oriented x 3. Normal speech and language.eye movements full without nystagmus.fundi were not visualized. Vision acuity and fields appear normal. Hearing is normal. Palatal movements are normal. Face symmetric. Tongue midline. Normal strength, tone, reflexes and coordination except mild weakness of left greater than right leg.. Normal sensation. Gait deferred. ASSESSMENT/PLAN [name] is a 86 y.o. female with history of HTN presenting with blurred vision since Friday.   Stroke:   R basal ganglia infarct, larger than a typical small vessel disease infarct, possible embolic source  CT head No acute abnormality. Small vessel disease.  Atrophy.   MRI  2 cm R corona radiata infarct. Small vessel disease.   MR CS  Mild spondylolisthesis C2-3, C7-T1, widespread CS degeneration, no cord signal abnormality. Widespread moderate and severe degnerative foraminal stenosis   CTA head & neck proximal R ICA 50% stenosis. R thyroid lobe heterogeneous and enlarged, [place] recommended  2D Echo pending   Given age 43, OP 30d monitor to look for AF, consider loop if 30 d neg     LDL 132  HgbA1c pending   VTE prophylaxis - SCDs   No antithrombotic prior to admission, now on aspirin 81 mg daily and clopidogrel 75 mg daily following plavix load.  Treat w/ DAPT x 3 weeks then aspirin alone  Therapy recommendations:  CIR  Disposition:  pending - independent and lives alone PTA  Hypertensive Urgency  BP as high as 235/80  Stable w/ SBP on the high side at times . BP goal set as < 180 my medical hospitalist, prns available . Long-term BP goal normotensive  Hyperlipidemia  Home meds:  No statin  Now on lipitor 40  LDL 132, goal < 70  Continue statin at discharge  Other Stroke Risk Factors  Advanced age  Other Active Problems  AKI, dehydrated   normocytic anemia  Hyponatremia   Hx colon cancer s/p colecteomy  B LE neuropathy    Asymptomatic bacteriuria   R thyroid lobe heterogeneous and enlarged, [place] recommended  Hospital day # 1 She presented with two 3-day history of gait difficulties due to likely mild leg weakness from large right subcortical infarct recommend ongoing stroke work-up and aggressive risk factor modification.  And 30-day heart monitor upon discharge for paroxysmal A. Fib. aspirin Plavix for 3 weeks followed by aspirin alone.  She will likely need inpatient rehab.  Long discussion with patient and daughter and Dr.Pahwani and answered questions.  Greater than 50% time during the 35-minute visit was spent in counseling and coordination of care about stroke and discussion about evaluation and treatment  plan Antony Contras, MD  To contact Stroke Continuity provider, please refer to [email or web address] After hours, contact General Neurology

## 2019-11-18 NOTE — H&P (Signed)
Physical Medicine and Rehabilitation Admission H&P     HPI: Cindy Smith is a 86 year old right-handed female with history of hypertension as well as colon cancer with colectomy.  Per chart review lives alone independent with assistive device.  1 level home one-step to entry.  She does have family checks on her routinely.  Presented 11/17/2019 with blurred vision and gait disturbance.  CT/MRI showed acute small vessel infarct in the right corona radiata external capsule.  No associated hemorrhage.  CT angiogram of the head and neck with no intracranial occlusion or high-grade stenosis.  Incidental finding of thyroid nodule.  Echocardiogram with ejection fraction of 65 to 70% no wall motion abnormalities grade 2 diastolic dysfunction.  Admission chemistry sodium 133 glucose 108 creatinine 1.10 urinalysis negative nitrite.  Currently maintained on aspirin and Plavix for CVA prophylaxis.  Tolerating a regular diet.  Therapy evaluations completed and patient was admitted for a comprehensive rehab program.  Review of Systems  Constitutional: Negative for chills and fever.  HENT: Positive for hearing loss.   Eyes: Positive for blurred vision.  Respiratory: Negative for cough and shortness of breath.   Cardiovascular: Negative for chest pain, palpitations and leg swelling.  Gastrointestinal: Positive for constipation. Negative for heartburn, nausea and vomiting.  Genitourinary: Negative for dysuria, flank pain and hematuria.  Musculoskeletal: Positive for joint pain and myalgias.  Skin: Negative for rash.  Neurological: Positive for weakness.  All other systems reviewed and are negative.  Past Medical History:  Diagnosis Date  . HTN (hypertension)    Past Surgical History:  Procedure Laterality Date  . COLECTOMY     History reviewed. No pertinent family history. Social History:  reports that she has never smoked. She has never used smokeless tobacco. She reports that she does not  drink alcohol and does not use drugs. Allergies:  Allergies  Allergen Reactions  . Codeine     "I just pass out"  . Other Other (See Comments)    Steroid (Lotemax eye drops)   Medications Prior to Admission  Medication Sig Dispense Refill  . atenolol (TENORMIN) 25 MG tablet Take 25 mg by mouth daily.    . cyanocobalamin 1000 MCG tablet Take 1,000 mcg by mouth daily.    Marland Kitchen latanoprost (XALATAN) 0.005 % ophthalmic solution Place 1 drop into both eyes every evening.    Marland Kitchen VITAMIN D, CHOLECALCIFEROL, PO Take 1 tablet by mouth daily.      Drug Regimen Review Drug regimen was reviewed and remains appropriate with no significant issues identified  Home: Home Living Family/patient expects to be discharged to:: Private residence Living Arrangements: Alone Available Help at Discharge: Family, Available PRN/intermittently, Neighbor, Other (Comment) (grandchildren) Type of Home: House Home Access: Stairs to enter [employer] of Steps: 1 with no rail Entrance Stairs-Rails: None Home Layout: One level Bathroom Shower/Tub: Other (comment) (takes birdbaths at the sink) Home Equipment: Walker - 4 wheels Additional Comments: has had neuropathy for awhile, about 4 yars ago she hurt her knee and never got it evaluated  Lives With: Alone   Functional History: Prior Function Level of Independence: Independent with assistive device(s)  Functional Status:  Mobility: Bed Mobility Overal bed mobility: Needs Assistance Bed Mobility: Supine to Sit Supine to sit: Min assist, HOB elevated General bed mobility comments: increased time and effort, minA for assist with L LE intermittently Transfers Overall transfer level: Needs assistance Equipment used: Rolling walker (2 wheeled) Transfers: Sit to/from Stand, [employer] Transfers Sit to Stand: Min assist,  Mod assist Stand pivot transfers: Mod assist General transfer comment: fluctuating between Min-ModA dependent on fatigue levels, max  cues for hand placement and safety; ModA to pivot over to [facility] while controlling RW and maintaining balance Ambulation/Gait General Gait Details: deferred due to stool incontinence- but able to take small shuffle steps during transfers    ADL: ADL Overall ADL's : Needs assistance/impaired Eating/Feeding: Set up, Sitting Upper Body Dressing : Minimal assistance, Sitting Upper Body Dressing Details (indicate cue type and reason): To don posterior gown seated EOB.  Lower Body Dressing: Moderate assistance, Sitting/lateral leans, Sit to/from stand Toilet Transfer: Moderate assistance Toilet Transfer Details (indicate cue type and reason): (P) Mod A with cues for hand placement.  Toileting- Clothing Manipulation and Hygiene: (P) Moderate assistance Toileting - Clothing Manipulation Details (indicate cue type and reason): (P) Mod A for clothing/hygiene management   Cognition: Cognition Overall Cognitive Status: Within Functional Limits for tasks assessed Arousal/Alertness: Awake/alert Orientation Level: Oriented X4 Attention: Focused, Sustained, Selective Focused Attention: Appears intact Sustained Attention: Appears intact Selective Attention: Appears intact Memory: Appears intact Awareness: Appears intact Problem Solving: Appears intact Executive Function: Organizing, Decision Making, Sequencing, Reasoning Reasoning: Appears intact Sequencing: Appears intact Organizing: Appears intact Decision Making: Appears intact Safety/Judgment: Appears intact Cognition Arousal/Alertness: Awake/alert Behavior During Therapy: WFL for tasks assessed/performed Overall Cognitive Status: Within Functional Limits for tasks assessed General Comments: perhaps a bit HOH and with difficulty understanding through masks/face shield but WNL otherwise  Physical Exam: Blood pressure (!) 163/71, pulse 73, temperature 98.1 F (36.7 C), temperature source Axillary, resp. rate 18, height 5' (1.524 m), weight  59.1 kg, SpO2 96 %. General: Alert and oriented x 3, No apparent distress HEENT: Head is normocephalic, atraumatic, PERRLA, EOMI, sclera anicteric, oral mucosa pink and moist, dentition intact, ext ear canals clear,  Neck: Supple without JVD or lymphadenopathy Heart: Reg rate and rhythm. No murmurs rubs or gallops Chest: CTA bilaterally without wheezes, rales, or rhonchi; no distress Abdomen: Soft, non-tender, non-distended, bowel sounds positive. Extremities: No clubbing, cyanosis, or edema. Pulses are 2+ Skin: Clean and intact without signs of breakdown Neuro: Patient is alert in no acute distress.  Makes eye contact with examiner and follows commands.  Oriented x3.  Fair awareness of deficits. 5/5 strength throughout except 4/5 LLE.  Psych: Pt's affect is appropriate. Pt is cooperative   Results for orders placed or performed during the hospital encounter of 11/17/19 (from the past 48 hour(s))  Basic metabolic panel     Status: Abnormal   Collection Time: 11/17/19  1:41 PM  Result Value Ref Range   Sodium 133 (L) 135 - 145 mmol/L   Potassium 4.4 3.5 - 5.1 mmol/L   Chloride 100 98 - 111 mmol/L   CO2 23 22 - 32 mmol/L   Glucose, Bld 108 (H) 70 - 99 mg/dL    Comment: Glucose reference range applies only to samples taken after fasting for at least 8 hours.   BUN 22 8 - 23 mg/dL   Creatinine, Ser 1.10 (H) 0.44 - 1.00 mg/dL   Calcium 9.8 8.9 - 10.3 mg/dL   GFR, Estimated 43 (L) >60 mL/min   Anion gap 10 5 - 15    Comment: Performed at [facility] [address], [place], [place] [address]  CBC     Status: Abnormal   Collection Time: 11/17/19  1:41 PM  Result Value Ref Range   WBC 6.2 4.0 - 10.5 K/uL   RBC 3.80 (L) 3.87 -  5.11 MIL/uL   Hemoglobin 11.3 (L) 12.0 - 15.0 g/dL   HCT 35.8 (L) 36 - 46 %   MCV 94.2 80.0 - 100.0 fL   MCH 29.7 26.0 - 34.0 pg   MCHC 31.6 30.0 - 36.0 g/dL   RDW 13.6 11.5 - 15.5 %   Platelets 234 150 - 400 K/uL   nRBC 0.0 0.0 - 0.2 %    Comment:  Performed at [facility] [address] [place], [place] [address]  Respiratory Panel by RT PCR (Flu A&B, Covid) - Nasopharyngeal Swab     Status: None   Collection Time: 11/17/19  5:32 PM   Specimen: Nasopharyngeal Swab  Result Value Ref Range   SARS Coronavirus 2 by RT PCR NEGATIVE NEGATIVE    Comment: (NOTE) SARS-CoV-2 target nucleic acids are NOT DETECTED.  The SARS-CoV-2 RNA is generally detectable in upper respiratoy specimens during the acute phase of infection. The lowest concentration of SARS-CoV-2 viral copies this assay can detect is 131 copies/mL. A negative result does not preclude SARS-Cov-2 infection and should not be used as the sole basis for treatment or other patient management decisions. A negative result may occur with  improper specimen collection/handling, submission of specimen other than nasopharyngeal swab, presence of viral mutation(s) within the areas targeted by this assay, and inadequate number of viral copies (<131 copies/mL). A negative result must be combined with clinical observations, patient history, and epidemiological information. The expected result is Negative.  Fact Sheet for Patients:  PinkCheek.be  Fact Sheet for Healthcare Providers:  GravelBags.it  This test is no t yet approved or cleared by the [place] FDA and  has been authorized for detection and/or diagnosis of SARS-CoV-2 by FDA under an Emergency Use Authorization (EUA). This EUA will remain  in effect (meaning this test can be used) for the duration of the COVID-19 declaration under Section 564(b)(1) of the Act, 21 U.S.C. section 360bbb-3(b)(1), unless the authorization is terminated or revoked sooner.     Influenza A by PCR NEGATIVE NEGATIVE   Influenza B by PCR NEGATIVE NEGATIVE    Comment: (NOTE) The Xpert Xpress SARS-CoV-2/FLU/RSV assay is intended as an aid in  the diagnosis of influenza from  Nasopharyngeal swab specimens and  should not be used as a sole basis for treatment. Nasal washings and  aspirates are unacceptable for Xpert Xpress SARS-CoV-2/FLU/RSV  testing.  Fact Sheet for Patients: PinkCheek.be  Fact Sheet for Healthcare Providers: GravelBags.it  This test is not yet approved or cleared by the [place] FDA and  has been authorized for detection and/or diagnosis of SARS-CoV-2 by  FDA under an Emergency Use Authorization (EUA). This EUA will remain  in effect (meaning this test can be used) for the duration of the  Covid-19 declaration under Section 564(b)(1) of the Act, 21  U.S.C. section 360bbb-3(b)(1), unless the authorization is  terminated or revoked. Performed at [facility], [place] [address] [place], [place] [address]   Urinalysis, Routine w reflex microscopic Urine, Clean Catch     Status: Abnormal   Collection Time: 11/17/19  5:44 PM  Result Value Ref Range   Color, Urine YELLOW YELLOW   APPearance HAZY (A) CLEAR   Specific Gravity, Urine 1.008 1.005 - 1.030   pH 6.0 5.0 - 8.0   Glucose, UA NEGATIVE NEGATIVE mg/dL   Hgb urine dipstick NEGATIVE NEGATIVE   Bilirubin Urine NEGATIVE NEGATIVE   Ketones, ur NEGATIVE NEGATIVE mg/dL   Protein, ur 30 (A)  NEGATIVE mg/dL   Nitrite NEGATIVE NEGATIVE   Leukocytes,Ua LARGE (A) NEGATIVE   RBC / HPF 0-5 0 - 5 RBC/hpf   WBC, UA 21-50 0 - 5 WBC/hpf   Bacteria, UA FEW (A) NONE SEEN   Squamous Epithelial / LPF 0-5 0 - 5    Comment: Performed at [facility], [place] [address], [place], [place] [address]  CBC     Status: Abnormal   Collection Time: 11/18/19 12:07 AM  Result Value Ref Range   WBC 6.5 4.0 - 10.5 K/uL   RBC 3.60 (L) 3.87 - 5.11 MIL/uL   Hemoglobin 11.1 (L) 12.0 - 15.0 g/dL   HCT 34.3 (L) 36 - 46 %   MCV 95.3 80.0 - 100.0 fL   MCH 30.8 26.0 - 34.0 pg   MCHC 32.4 30.0 - 36.0 g/dL   RDW 13.6 11.5 - 15.5 %   Platelets 223 150 -  400 K/uL   nRBC 0.0 0.0 - 0.2 %    Comment: Performed at [facility], [place] [address], [place], [place] [address]  Lipid panel     Status: Abnormal   Collection Time: 11/18/19  4:40 AM  Result Value Ref Range   Cholesterol 212 (H) 0 - 200 mg/dL   Triglycerides 90 <150 mg/dL   HDL 62 >40 mg/dL   Total CHOL/HDL Ratio 3.4 RATIO   VLDL 18 0 - 40 mg/dL   LDL Cholesterol 132 (H) 0 - 99 mg/dL    Comment:        Total Cholesterol/HDL:CHD Risk Coronary Heart Disease Risk Table                     Men   Women  1/2 Average Risk   3.4   3.3  Average Risk       5.0   4.4  2 X Average Risk   9.6   7.1  3 X Average Risk  23.4   11.0        Use the calculated Patient Ratio above and the CHD Risk Table to determine the patient's CHD Risk.        ATP III CLASSIFICATION (LDL):  <100     mg/dL   Optimal  100-129  mg/dL   Near or Above                    Optimal  130-159  mg/dL   Borderline  160-189  mg/dL   High  >190     mg/dL   Very High Performed at [facility] [address], [place], [place] [address]   Basic metabolic panel     Status: Abnormal   Collection Time: 11/18/19  4:40 AM  Result Value Ref Range   Sodium 135 135 - 145 mmol/L   Potassium 3.9 3.5 - 5.1 mmol/L   Chloride 100 98 - 111 mmol/L   CO2 21 (L) 22 - 32 mmol/L   Glucose, Bld 88 70 - 99 mg/dL    Comment: Glucose reference range applies only to samples taken after fasting for at least 8 hours.   BUN 21 8 - 23 mg/dL   Creatinine, Ser 1.03 (H) 0.44 - 1.00 mg/dL   Calcium 9.8 8.9 - 10.3 mg/dL   GFR, Estimated 47 (L) >60 mL/min   Anion gap 14 5 - 15    Comment: Performed at [facility] [address], [place],  [address]  Magnesium     Status: None   Collection Time: 11/18/19  4:40 AM  Result Value Ref Range   Magnesium 2.2 1.7 - 2.4 mg/dL    Comment: Performed at [facility] [address] [place], [place] [address]   CT HEAD WO CONTRAST  Result Date: 11/17/2019 CLINICAL DATA:   Acute neuro deficit.  Rule out stroke.  Weakness. EXAM: CT HEAD WITHOUT CONTRAST TECHNIQUE: Contiguous axial images were obtained from the base of the skull through the vertex without intravenous contrast. COMPARISON:  None. FINDINGS: Brain: Mild atrophy. Extensive white matter hypodensity diffusely bilaterally. Negative for acute cortical infarct. Negative for hemorrhage or mass. Vascular: Negative for hyperdense vessel Skull: Negative Sinuses/Orbits: Air-fluid level sphenoid sinus. Remaining sinuses clear. Mastoid clear bilaterally. Bilateral cataract extraction. Other: Multiple skin lesions in the scalp. Calcified dermal lesion right parietal scalp most likely Pilar cyst. IMPRESSION: Atrophy with diffuse white matter disease most likely chronic microvascular ischemia No acute abnormality. Electronically Signed   By: Franchot Gallo M.D.   On: 11/17/2019 14:52   MR BRAIN WO CONTRAST  Result Date: 11/17/2019 CLINICAL DATA:  86 year old female with acute weakness, imbalance. EXAM: MRI HEAD WITHOUT CONTRAST TECHNIQUE: Multiplanar, multiecho pulse sequences of the brain and surrounding structures were obtained without intravenous contrast. COMPARISON:  Head CT earlier today. FINDINGS: Brain: Linear, patchy restricted diffusion in the right corona radiata tracking toward the external capsule (series 5, image 75 and series 7, image 62). No other restricted diffusion. Underlying Patchy and confluent bilateral cerebral white matter T2 and FLAIR hyperintensity. Similar signal heterogeneity throughout the bilateral basal ganglia, worse on the right. Occasional chronic micro hemorrhages (left periatrial white matter series 14, image 29). No evidence of acute hemorrhage. No mass effect. No midline shift, evidence of mass lesion, ventriculomegaly, extra-axial collection. Cervicomedullary junction and pituitary are within normal limits. Mild for age signal heterogeneity in the pons. No cortical encephalomalacia identified.  Vascular: Major intracranial vascular flow voids are preserved. Skull and upper cervical spine: Cervical spine detailed separately today. Normal visible bone marrow signal. Sinuses/Orbits: Negative, postoperative changes to both globes. Other: Mastoids are well pneumatized. Visible internal auditory structures appear normal. Scalp and face appear negative. IMPRESSION: 1. Acute small vessel infarct in the right corona radiata, external capsule. No associated hemorrhage or mass effect. 2. Underlying moderate to advanced chronic small vessel disease. Electronically Signed   By: Genevie Ann M.D.   On: 11/17/2019 18:44   MR Cervical Spine Wo Contrast  Result Date: 11/17/2019 CLINICAL DATA:  86 year old female with acute weakness, imbalance. EXAM: MRI CERVICAL SPINE WITHOUT CONTRAST TECHNIQUE: Multiplanar, multisequence MR imaging of the cervical spine was performed. No intravenous contrast was administered. COMPARISON:  Brain MRI today reported separately. FINDINGS: Alignment: Mild degenerative anterolisthesis of C2 on C3, C7 on T1. Overall straightening of cervical lordosis. Vertebrae: Widespread degenerative endplate marrow signal changes in the cervical spine. No marrow edema or evidence of acute osseous abnormality. Benign vertebral body hemangioma at T2 on the right. Cord: No cervical spinal cord signal abnormality despite multilevel degenerative stenosis with some cord mass effect detailed below. Visible upper thoracic cord appears normal. Posterior Fossa, vertebral arteries, paraspinal tissues: Cervicomedullary junction is within normal limits. Brain is detailed separately today. Preserved major vascular flow voids in the neck. Thyromegaly (series 7, image 6 on the right, but no follow-up indicated in this age group (ref: J Am Coll Radiol. 2015 Feb;12(2): 143-50). Disc levels: C2-C3: Anterolisthesis associated with moderate left and severe right facet hypertrophy.  Ligament flavum hypertrophy. No spinal stenosis.  Moderate to severe bilateral C3 foraminal stenosis greater on the right. C3-C4: Severe disc space loss. Circumferential disc osteophyte complex. Mild to moderate facet and ligament flavum hypertrophy. Mild spinal stenosis. Mild if any cord mass effect. Severe bilateral C4 foraminal stenosis. C4-C5: Severe disc space loss. Circumferential disc osteophyte complex. Mild to moderate posterior element hypertrophy. Mild spinal stenosis and spinal cord mass effect. Severe bilateral C5 foraminal stenosis. C5-C6: Disc space loss with circumferential disc osteophyte complex. Mild to moderate posterior element hypertrophy. No significant spinal stenosis. Moderate left and moderate to severe right C6 foraminal stenosis. C6-C7: Mild disc space loss. Circumferential disc osteophyte complex. Moderate facet and ligament flavum hypertrophy. No significant spinal stenosis. Moderate bilateral C7 foraminal stenosis. C7-T1: Mild anterolisthesis. Mild to moderate posterior element hypertrophy. No significant stenosis. IMPRESSION: 1. No acute osseous abnormality in the cervical spine. Mild degenerative spondylolisthesis at C2-C3 and C7-T1 with facet arthropathy. 2. Widespread cervical spine degeneration. Mild spinal stenosis at C3-C4 and C4-C5 with up to mild spinal cord mass effect, but no cord signal abnormality. 3. Widespread moderate and severe degenerative neural foraminal stenosis, only sparing the C8 nerve level. Electronically Signed   By: Genevie Ann M.D.   On: 11/17/2019 18:56   ECHOCARDIOGRAM COMPLETE  Result Date: 11/18/2019    ECHOCARDIOGRAM REPORT   Patient Name:   Cindy Smith Date of Exam: 11/18/2019 Medical Rec #:  [id number]            Height:       60.0 in Accession #:    [id number]           Weight:       130.3 lb Date of Birth:  09-21-1923           BSA:          1.556 m Patient Age:    4 years             BP:           163/71 mmHg Patient Gender: F                    HR:           68 bpm. Exam Location:   Inpatient Procedure: 2D Echo, Cardiac Doppler and Color Doppler Indications:    Stroke 434.91 / I163.9  History:        Patient has no prior history of Echocardiogram examinations.                 Risk Factors:Hypertension and Non-Smoker.  Sonographer:    Vickie Epley RDCS Referring Phys: [id number] [place]  1. Left ventricular ejection fraction, by estimation, is 65 to 70%. The left ventricle has normal function. The left ventricle has no regional wall motion abnormalities. Left ventricular diastolic parameters are consistent with Grade II diastolic dysfunction (pseudonormalization). Elevated left atrial pressure.  2. Right ventricular systolic function is normal. The right ventricular size is normal.  3. Left atrial size was mild to moderately dilated.  4. Right atrial size was mildly dilated.  5. The mitral valve is normal in structure. Mild mitral valve regurgitation. No evidence of mitral stenosis. Moderate mitral annular calcification.  6. The aortic valve is normal in structure. There is moderate calcification of the aortic valve. There is moderate thickening of the aortic valve. Aortic valve regurgitation is mild. Mild aortic valve stenosis. Aortic valve mean gradient measures 10.0 mmHg.  7. The  inferior vena cava is normal in size with greater than 50% respiratory variability, suggesting right atrial pressure of 3 mmHg. Conclusion(s)/Recommendation(s): No intracardiac source of embolism detected on this transthoracic study. A transesophageal echocardiogram is recommended to exclude cardiac source of embolism if clinically indicated. FINDINGS  Left Ventricle: Left ventricular ejection fraction, by estimation, is 65 to 70%. The left ventricle has normal function. The left ventricle has no regional wall motion abnormalities. The left ventricular internal cavity size was normal in size. There is  no left ventricular hypertrophy. Left ventricular diastolic parameters are consistent with Grade II  diastolic dysfunction (pseudonormalization). Elevated left atrial pressure. Right Ventricle: The right ventricular size is normal. No increase in right ventricular wall thickness. Right ventricular systolic function is normal. Left Atrium: Left atrial size was mild to moderately dilated. Right Atrium: Right atrial size was mildly dilated. Pericardium: Trivial pericardial effusion is present. Mitral Valve: The mitral valve is normal in structure. There is moderate thickening of the mitral valve leaflet(s). There is moderate calcification of the mitral valve leaflet(s). Moderate mitral annular calcification. Mild mitral valve regurgitation. No  evidence of mitral valve stenosis. Tricuspid Valve: The tricuspid valve is normal in structure. Tricuspid valve regurgitation is mild . No evidence of tricuspid stenosis. Aortic Valve: The aortic valve is normal in structure. There is moderate calcification of the aortic valve. There is moderate thickening of the aortic valve. Aortic valve regurgitation is mild. Mild aortic stenosis is present. Aortic valve mean gradient measures 10.0 mmHg. Pulmonic Valve: The pulmonic valve was normal in structure. Pulmonic valve regurgitation is not visualized. No evidence of pulmonic stenosis. Aorta: The aortic root is normal in size and structure. Venous: The inferior vena cava is normal in size with greater than 50% respiratory variability, suggesting right atrial pressure of 3 mmHg. IAS/Shunts: No atrial level shunt detected by color flow Doppler.  LEFT VENTRICLE PLAX 2D LVIDd:         5.10 cm      Diastology LVIDs:         3.60 cm      LV e' medial:    3.50 cm/s LV PW:         0.90 cm      LV E/e' medial:  28.1 LV IVS:        0.90 cm      LV e' lateral:   6.00 cm/s LVOT diam:     2.10 cm      LV E/e' lateral: 16.4 LV SV:         69 LV SV Index:   45 LVOT Area:     3.46 cm  LV Volumes (MOD) LV vol d, MOD A2C: 102.0 ml LV vol d, MOD A4C: 87.6 ml LV vol s, MOD A2C: 47.1 ml LV vol s, MOD  A4C: 46.4 ml LV SV MOD A2C:     54.9 ml LV SV MOD A4C:     87.6 ml LV SV MOD BP:      51.1 ml RIGHT VENTRICLE RV S prime:     10.70 cm/s TAPSE (M-mode): 1.4 cm LEFT ATRIUM             Index       RIGHT ATRIUM           Index LA diam:        4.50 cm 2.89 cm/m  RA Area:     11.30 cm LA Vol (A2C):   59.4 ml 38.18 ml/m RA Volume:  24.60 ml  15.81 ml/m LA Vol (A4C):   40.9 ml 26.29 ml/m LA Biplane Vol: 49.8 ml 32.01 ml/m  AORTIC VALVE AV Mean Grad: 10.0 mmHg LVOT Vmax:    94.60 cm/s LVOT Vmean:   63.500 cm/s LVOT VTI:     0.200 m  AORTA Ao Root diam: 3.00 cm MITRAL VALVE MV Area (PHT): 3.85 cm    SHUNTS MV Decel Time: 197 msec    Systemic VTI:  0.20 m MV E velocity: 98.50 cm/s  Systemic Diam: 2.10 cm MV A velocity: 71.60 cm/s MV E/A ratio:  1.38 Ena Dawley MD Electronically signed by Ena Dawley MD Signature Date/Time: 11/18/2019/1:52:00 PM    Final    CT ANGIO HEAD CODE STROKE  Result Date: 11/17/2019 CLINICAL DATA:  TIA EXAM: CT ANGIOGRAPHY HEAD AND NECK TECHNIQUE: Multidetector CT imaging of the head and neck was performed using the standard protocol during bolus administration of intravenous contrast. Multiplanar CT image reconstructions and MIPs were obtained to evaluate the vascular anatomy. Carotid stenosis measurements (when applicable) are obtained utilizing NASCET criteria, using the distal internal carotid diameter as the denominator. CONTRAST:  66mL OMNIPAQUE IOHEXOL 350 MG/ML SOLN COMPARISON:  None. FINDINGS: CTA NECK FINDINGS SKELETON: There is no bony spinal canal stenosis. No lytic or blastic lesion. OTHER NECK: Heterogeneous and enlarged right thyroid lobe measuring 3 1 x 2.5 cm. UPPER CHEST: Biapical emphysema AORTIC ARCH: There is calcific atherosclerosis of the aortic arch. There is no aneurysm, dissection or hemodynamically significant stenosis of the visualized portion of the aorta. Conventional 3 vessel aortic branching pattern. The visualized proximal subclavian arteries are  widely patent. RIGHT CAROTID SYSTEM: No dissection, occlusion or aneurysm. There is predominantly calcified atherosclerosis extending into the proximal ICA, resulting in 50% stenosis. LEFT CAROTID SYSTEM: No dissection, occlusion or aneurysm. Mild atherosclerotic calcification at the carotid bifurcation without hemodynamically significant stenosis. VERTEBRAL ARTERIES: Codominant configuration. Both origins are clearly patent. There is no dissection, occlusion or flow-limiting stenosis to the skull base (V1-V3 segments). CTA HEAD FINDINGS POSTERIOR CIRCULATION: --Vertebral arteries: Normal V4 segments. --Inferior cerebellar arteries: Normal. --Basilar artery: Normal. --Superior cerebellar arteries: Normal. --Posterior cerebral arteries (PCA): Normal. ANTERIOR CIRCULATION: --Intracranial internal carotid arteries: Atherosclerotic calcification of the internal carotid arteries at the skull base without hemodynamically significant stenosis. --Anterior cerebral arteries (ACA): Normal. Both A1 segments are present. Patent anterior communicating artery (a-comm). --Middle cerebral arteries (MCA): Normal. VENOUS SINUSES: As permitted by contrast timing, patent. ANATOMIC VARIANTS: None Review of the MIP images confirms the above findings. IMPRESSION: 1. No intracranial arterial occlusion or high-grade stenosis. 2. Approximately 50% stenosis of the proximal right internal carotid artery secondary to predominantly calcified atherosclerosis. 3. Heterogeneous and enlarged right thyroid lobe measuring 3 1 x 2.5 cm. Recommend thyroid ultrasound (ref: J Am Coll Radiol. 2015 Feb;12(2): 143-50). Aortic Atherosclerosis (ICD10-I70.0) and Emphysema (ICD10-J43.9). Electronically Signed   By: Ulyses Jarred M.D.   On: 11/17/2019 21:51   CT ANGIO NECK CODE STROKE  Result Date: 11/17/2019 CLINICAL DATA:  TIA EXAM: CT ANGIOGRAPHY HEAD AND NECK TECHNIQUE: Multidetector CT imaging of the head and neck was performed using the standard  protocol during bolus administration of intravenous contrast. Multiplanar CT image reconstructions and MIPs were obtained to evaluate the vascular anatomy. Carotid stenosis measurements (when applicable) are obtained utilizing NASCET criteria, using the distal internal carotid diameter as the denominator. CONTRAST:  37mL OMNIPAQUE IOHEXOL 350 MG/ML SOLN COMPARISON:  None. FINDINGS: CTA NECK FINDINGS SKELETON: There is no bony spinal canal stenosis. No lytic  or blastic lesion. OTHER NECK: Heterogeneous and enlarged right thyroid lobe measuring 3 1 x 2.5 cm. UPPER CHEST: Biapical emphysema AORTIC ARCH: There is calcific atherosclerosis of the aortic arch. There is no aneurysm, dissection or hemodynamically significant stenosis of the visualized portion of the aorta. Conventional 3 vessel aortic branching pattern. The visualized proximal subclavian arteries are widely patent. RIGHT CAROTID SYSTEM: No dissection, occlusion or aneurysm. There is predominantly calcified atherosclerosis extending into the proximal ICA, resulting in 50% stenosis. LEFT CAROTID SYSTEM: No dissection, occlusion or aneurysm. Mild atherosclerotic calcification at the carotid bifurcation without hemodynamically significant stenosis. VERTEBRAL ARTERIES: Codominant configuration. Both origins are clearly patent. There is no dissection, occlusion or flow-limiting stenosis to the skull base (V1-V3 segments). CTA HEAD FINDINGS POSTERIOR CIRCULATION: --Vertebral arteries: Normal V4 segments. --Inferior cerebellar arteries: Normal. --Basilar artery: Normal. --Superior cerebellar arteries: Normal. --Posterior cerebral arteries (PCA): Normal. ANTERIOR CIRCULATION: --Intracranial internal carotid arteries: Atherosclerotic calcification of the internal carotid arteries at the skull base without hemodynamically significant stenosis. --Anterior cerebral arteries (ACA): Normal. Both A1 segments are present. Patent anterior communicating artery (a-comm).  --Middle cerebral arteries (MCA): Normal. VENOUS SINUSES: As permitted by contrast timing, patent. ANATOMIC VARIANTS: None Review of the MIP images confirms the above findings. IMPRESSION: 1. No intracranial arterial occlusion or high-grade stenosis. 2. Approximately 50% stenosis of the proximal right internal carotid artery secondary to predominantly calcified atherosclerosis. 3. Heterogeneous and enlarged right thyroid lobe measuring 3 1 x 2.5 cm. Recommend thyroid ultrasound (ref: J Am Coll Radiol. 2015 Feb;12(2): 143-50). Aortic Atherosclerosis (ICD10-I70.0) and Emphysema (ICD10-J43.9). Electronically Signed   By: Ulyses Jarred M.D.   On: 11/17/2019 21:51       Medical Problem List and Plan: 1.  Blurred vision with gait disturbance secondary to right basal ganglia infarction.  Event monitor to be arranged for outpatient  -patient may shower  -ELOS/Goals: 10-12 days modI 2.  Antithrombotics: -DVT/anticoagulation:  SCD  -antiplatelet therapy: Aspirin 81 mg daily and Plavix 75 mg daily x3 weeks then aspirin alone 3. Pain Management: Tylenol as needed. Add kpad for upper back muscle spasms.  4. Mood: Provide emotional support  -antipsychotic agents: N/A 5. Neuropsych: This patient is capable of making decisions on her own behalf. 6. Skin/Wound Care: Routine skin checks. May discontinue IV 7. Fluids/Electrolytes/Nutrition: Routine in and outs with follow-up chemistries 8.  Hypertension.  Norvasc 10 mg daily, Tenormin 25 mg daily. Will add po Hydralazine 25mg  q8H prn for SBP>180.  Monitor with increased mobility 9.  Hyperlipidemia.  Lipitor 10.  History of colon cancer with colectomy.  Follow-up outpatient 11.  Anemia of chronic disease.  Follow-up CBC 12.  Incidental finding of nontoxic multinodular goiter.  Follow-up outpatient ultrasound  Cathlyn Parsons, PA-C 11/18/2019   I have personally performed a face to face diagnostic evaluation, including, but not limited to relevant history  and physical exam findings, of this patient and developed relevant assessment and plan.  Additionally, I have reviewed and concur with the physician assistant's documentation above.  Leeroy Cha, MD

## 2019-11-18 NOTE — Evaluation (Signed)
Physical Therapy Evaluation Patient Details Name: Cindy Smith MRN: [id number] DOB: 02/21/1923 Today's Date: 11/18/2019   History of Present Illness  86yo female presenting with difficulty walking, vision changes, numbness and tingling BLEs. BP 220/70s in ED. 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 No tpa given. PMH HTN, chronic L knee injury, colon CA s/p colectomy  Clinical Impression   Patient received in bed, daughter present and supportive of potentially going to rehab prior to return home; both patient and family are very familiar with [facility] and are motivated for her to improve. Needed Min-ModA for functional mobility today as well as Max cues and occasional hand over hand assist for safety and hand placement. Session very limited by repeated bouts of incontinent liquid stool and urine. BP no higher than 170/60s but for the most part was at 160s/60s-70s. Definitely very far off from her independent baseline- but do question safety of mobility at home as she reports she often furniture walks at home. Left on BSC with NT present and attending. Both patient and daughter agreeable for potential CIR stay.     Follow Up Recommendations CIR;Supervision for mobility/OOB    Equipment Recommendations  Rolling walker with 5" wheels;3in1 (PT)    Recommendations for Other Services       Precautions / Restrictions Precautions Precautions: Fall;Other (comment) Precaution Comments: chronic L knee injury, watch BP (out of permissive HTN window per MD), incontinent of urine and stool Restrictions Weight Bearing Restrictions: No      Mobility  Bed Mobility Overal bed mobility: Needs Assistance Bed Mobility: Supine to Sit     Supine to sit: Min assist;HOB elevated     General bed mobility comments: increased time and effort, minA for assist with L LE intermittently  Transfers Overall transfer level: Needs assistance Equipment used: Rolling  walker (2 wheeled) Transfers: Sit to/from [facility] Sit to Stand: Min assist;Mod assist Stand pivot transfers: Mod assist       General transfer comment: fluctuating between Min-ModA dependent on fatigue levels, max cues for hand placement and safety; ModA to pivot over to [facility] while controlling RW and maintaining balance  Ambulation/Gait             General Gait Details: deferred due to stool incontinence- but able to take small shuffle steps during transfers  Stairs            Wheelchair Mobility    Modified Rankin (Stroke Patients Only) Modified Rankin (Stroke Patients Only) Pre-Morbid Rankin Score: No significant disability Modified Rankin: Moderate disability     Balance Overall balance assessment: Needs assistance Sitting-balance support: Bilateral upper extremity supported;Feet supported Sitting balance-Leahy Scale: Fair Sitting balance - Comments: min guard at EOB   Standing balance support: Bilateral upper extremity supported;During functional activity Standing balance-Leahy Scale: Poor Standing balance comment: heavy reliance on BUE support, up to MinA dynamically                             Pertinent Vitals/Pain Pain Assessment: No/denies pain    Home Living Family/patient expects to be discharged to:: Private residence Living Arrangements: Alone Available Help at Discharge: Family;Available PRN/intermittently;Neighbor;Other (Comment) (grandchildren) Type of Home: House Home Access: Stairs to enter Entrance Stairs-Rails: None Entrance Stairs-Number of Steps: 1 with no rail Home Layout: One level Home Equipment: Walker - 4 wheels Additional Comments: has had neuropathy for awhile, about 4 yars ago she hurt her  knee and never got it evaluated    Prior Function Level of Independence: Independent with assistive device(s)               Hand Dominance   Dominant Hand: Right    Extremity/Trunk Assessment    Upper Extremity Assessment Upper Extremity Assessment: Defer to OT evaluation    Lower Extremity Assessment Lower Extremity Assessment: Generalized weakness;LLE deficits/detail;RLE deficits/detail RLE Sensation: history of peripheral neuropathy LLE Deficits / Details: knee in valgus and adducted position, clearly favors L LE with mobility LLE Sensation: history of peripheral neuropathy LLE Coordination: decreased gross motor    Cervical / Trunk Assessment Cervical / Trunk Assessment: Kyphotic  Communication   Communication: HOH  Cognition Arousal/Alertness: Awake/alert Behavior During Therapy: WFL for tasks assessed/performed Overall Cognitive Status: Within Functional Limits for tasks assessed                                 General Comments: perhaps a bit HOH and with difficulty understanding through masks/face shield but WNL otherwise      General Comments General comments (skin integrity, edema, etc.): BP no more than 170/60s during session, seemed to stabilize at 160s/60-70s    Exercises     Assessment/Plan    PT Assessment Patient needs continued PT services  PT Problem List Decreased strength;Decreased knowledge of use of DME;Decreased activity tolerance;Decreased safety awareness;Decreased balance;Decreased mobility;Decreased coordination       PT Treatment Interventions DME instruction;Balance training;Gait training;Neuromuscular re-education;Stair training;Functional mobility training;Patient/family education;Therapeutic activities;Therapeutic exercise    PT Goals (Current goals can be found in the Care Plan section)  Acute Rehab PT Goals Patient Stated Goal: go to rehab before going home PT Goal Formulation: With patient/family Time For Goal Achievement: 12/02/19 Potential to Achieve Goals: Fair    Frequency Min 4X/week   Barriers to discharge        Co-evaluation               AM-PAC PT "6 Clicks" Mobility  Outcome Measure Help  needed turning from your back to your side while in a flat bed without using bedrails?: A Little Help needed moving from lying on your back to sitting on the side of a flat bed without using bedrails?: A Little Help needed moving to and from a bed to a chair (including a wheelchair)?: A Lot Help needed standing up from a chair using your arms (e.g., wheelchair or bedside chair)?: A Lot Help needed to walk in hospital room?: A Lot Help needed climbing 3-5 steps with a railing? : Total 6 Click Score: 13    End of Session Equipment Utilized During Treatment: Gait belt Activity Tolerance: Patient tolerated treatment well Patient left: Other (comment);with family/visitor present;with nursing/sitter in room (on [facility] with NT attending) Nurse Communication: Mobility status PT Visit Diagnosis: Unsteadiness on feet (R26.81);Difficulty in walking, not elsewhere classified (R26.2);Muscle weakness (generalized) (M62.81);Other abnormalities of gait and mobility (R26.89)    Time: 1410-3013 PT Time Calculation (min) (ACUTE ONLY): 59 min   Charges:   PT Evaluation $PT Eval Moderate Complexity: 1 Mod PT Treatments $Therapeutic Activity: 38-52 mins        Windell Norfolk, DPT, PN1   Supplemental Physical Therapist [place]    Pager [phone] Acute Rehab Office [phone]

## 2019-11-18 NOTE — Progress Notes (Signed)
OT Cancellation Note  Patient Details Name: Cindy Smith MRN: [id number] DOB: 03/20/1923   Cancelled Treatment:    Reason Eval/Treat Not Completed: Patient at procedure or test/ unavailable. Patient off floor for vascular study. Will continue efforts toward completion of evaluation.   Gloris Manchester OTR/L Supplemental OT, Department of rehab services [phone]  Moua Rasmusson R H. 11/18/2019, 2:14 PM

## 2019-11-18 NOTE — Progress Notes (Signed)
Floor coverage overnight event  Patient significantly hypertensive since the time admission with systolic 241-146W.  Admitted for a stroke but symptom onset was 6 days ago.  Out of permissive hypertension window.   -Hold home atenolol given mild bradycardia.  IV hydralazine PRN SBP >180 ordered.  Discussed with neurology.

## 2019-11-18 NOTE — ED Notes (Signed)
Called 3 W awaiting to call back from [employer]

## 2019-11-18 NOTE — Progress Notes (Signed)
Patient arrived from ED with family member.  Primary RN stated that she did not have permission for family member to stay and patient's daughter was explained policy and procedure for this facility during Covid times by CN.  Daughter was upset that she could not stay and started walking toward elevators asking how to leave here because she did not even know where she was.  CN walked daughter to the emergency room entrance to show her where she could safely wait inside the building for her ride to pick her up.

## 2019-11-18 NOTE — Progress Notes (Signed)
Inpatient Rehab Admissions Coordinator Note:   Per therapy recommendations, pt was screened for CIR candidacy by Pearly Apachito, MS CCC-SLP. At this time, Pt. Appears to have functional decline and is a good candidate for CIR. Will place order for rehab consult per protocol.  Please contact me with questions.   Aralynn Brake, MS, CCC-SLP Rehab Admissions Coordinator  [phone] (celll) [phone] (office)  

## 2019-11-18 NOTE — Consult Note (Signed)
Physical Medicine and Rehabilitation Consult Reason for Consult: Decreased functional ability with gait disturbance Referring Physician: [facility]   HPI: Cindy Smith is a 86 y.o. right-handed female with history of hypertension as well as colon cancer with colectomy.  Per chart review patient lives alone.  Independent with assistive device.  1 level home one-step to entry.  She does have family to check on her routinely.  Presented 11/17/2019 with blurred vision and gait disturbance.  CT/MRI showed acute small vessel infarct in the right corona radiata external capsule.  No associated hemorrhage.  CT angiogram of head and neck with no intracranial occlusion or high-grade stenosis.  Echocardiogram pending.  Admission chemistry sodium 133 glucose 108 creatinine 1.10, urinalysis negative nitrite.  Currently maintained on aspirin and Plavix for CVA prophylaxis.  Therapy evaluation completed with recommendations of physical medicine rehab consult.   Review of Systems  HENT: Positive for hearing loss.   Eyes: Positive for blurred vision.  Respiratory: Negative for shortness of breath.   Cardiovascular: Negative for chest pain, palpitations and leg swelling.  Gastrointestinal: Positive for constipation. Negative for heartburn, nausea and vomiting.  Genitourinary: Negative for flank pain and hematuria.  Musculoskeletal: Positive for joint pain and myalgias.  Skin: Negative for rash.  Neurological: Positive for weakness.  All other systems reviewed and are negative.  Past Medical History:  Diagnosis Date  . HTN (hypertension)    Past Surgical History:  Procedure Laterality Date  . COLECTOMY     History reviewed. No pertinent family history. Social History:  reports that she has never smoked. She has never used smokeless tobacco. She reports that she does not drink alcohol and does not use drugs. Allergies:  Allergies  Allergen Reactions  . Codeine     "I just pass out"  .  Other Other (See Comments)    Steroid (Lotemax eye drops)   Medications Prior to Admission  Medication Sig Dispense Refill  . atenolol (TENORMIN) 25 MG tablet Take 25 mg by mouth daily.    . cyanocobalamin 1000 MCG tablet Take 1,000 mcg by mouth daily.    Marland Kitchen latanoprost (XALATAN) 0.005 % ophthalmic solution Place 1 drop into both eyes every evening.    Marland Kitchen VITAMIN D, CHOLECALCIFEROL, PO Take 1 tablet by mouth daily.      Home: Home Living Family/patient expects to be discharged to:: Private residence Living Arrangements: Alone Available Help at Discharge: Family, Available PRN/intermittently, Neighbor, Other (Comment) (grandchildren) Type of Home: House Home Access: Stairs to enter [employer] of Steps: 1 with no rail Entrance Stairs-Rails: None Home Layout: One level Bathroom Shower/Tub: Other (comment) (takes birdbaths at the sink) Home Equipment: Walker - 4 wheels Additional Comments: has had neuropathy for awhile, about 4 yars ago she hurt her knee and never got it evaluated  Lives With: Alone  Functional History: Prior Function Level of Independence: Independent with assistive device(s) Functional Status:  Mobility: Bed Mobility Overal bed mobility: Needs Assistance Bed Mobility: Supine to Sit Supine to sit: Min assist, HOB elevated General bed mobility comments: increased time and effort, minA for assist with L LE intermittently Transfers Overall transfer level: Needs assistance Equipment used: Rolling walker (2 wheeled) Transfers: Sit to/from Stand, [employer] Transfers Sit to Stand: Min assist, Mod assist Stand pivot transfers: Mod assist General transfer comment: fluctuating between Min-ModA dependent on fatigue levels, max cues for hand placement and safety; ModA to pivot over to [facility] while controlling RW and maintaining balance Ambulation/Gait General Gait  Details: deferred due to stool incontinence- but able to take small shuffle steps during transfers     ADL:    Cognition: Cognition Overall Cognitive Status: Within Functional Limits for tasks assessed Arousal/Alertness: Awake/alert Orientation Level: Oriented X4 Attention: Focused, Sustained, Selective Focused Attention: Appears intact Sustained Attention: Appears intact Selective Attention: Appears intact Memory: Appears intact Awareness: Appears intact Problem Solving: Appears intact Executive Function: Organizing, Decision Making, Sequencing, Reasoning Reasoning: Appears intact Sequencing: Appears intact Organizing: Appears intact Decision Making: Appears intact Safety/Judgment: Appears intact Cognition Arousal/Alertness: Awake/alert Behavior During Therapy: WFL for tasks assessed/performed Overall Cognitive Status: Within Functional Limits for tasks assessed General Comments: perhaps a bit HOH and with difficulty understanding through masks/face shield but WNL otherwise  Blood pressure (!) 163/71, pulse 73, temperature 98.1 F (36.7 C), temperature source Axillary, resp. rate 18, height 5' (1.524 m), weight 59.1 kg, SpO2 96 %. Physical Exam  General: Alert and oriented x 3, No apparent distress HEENT: Head is normocephalic, atraumatic, PERRLA, EOMI, sclera anicteric, oral mucosa pink and moist, dentition intact, ext ear canals clear,  Neck: Supple without JVD or lymphadenopathy Heart: Reg rate and rhythm. No murmurs rubs or gallops Chest: CTA bilaterally without wheezes, rales, or rhonchi; no distress Abdomen: Soft, non-tender, non-distended, bowel sounds positive. Extremities: No clubbing, cyanosis, or edema. Pulses are 2+ Skin: Clean and intact without signs of breakdown Neuro: Patient is alert no acute distress.  Makes eye contact with examiner.  Provides her name age and date of birth.  Follows simple commands. 5/5 strength throughout Psych: Pt's affect is appropriate. Pt is cooperative   Results for orders placed or performed during the hospital encounter of  11/17/19 (from the past 24 hour(s))  Basic metabolic panel     Status: Abnormal   Collection Time: 11/17/19  1:41 PM  Result Value Ref Range   Sodium 133 (L) 135 - 145 mmol/L   Potassium 4.4 3.5 - 5.1 mmol/L   Chloride 100 98 - 111 mmol/L   CO2 23 22 - 32 mmol/L   Glucose, Bld 108 (H) 70 - 99 mg/dL   BUN 22 8 - 23 mg/dL   Creatinine, Ser 1.10 (H) 0.44 - 1.00 mg/dL   Calcium 9.8 8.9 - 10.3 mg/dL   GFR, Estimated 43 (L) >60 mL/min   Anion gap 10 5 - 15  CBC     Status: Abnormal   Collection Time: 11/17/19  1:41 PM  Result Value Ref Range   WBC 6.2 4.0 - 10.5 K/uL   RBC 3.80 (L) 3.87 - 5.11 MIL/uL   Hemoglobin 11.3 (L) 12.0 - 15.0 g/dL   HCT 35.8 (L) 36 - 46 %   MCV 94.2 80.0 - 100.0 fL   MCH 29.7 26.0 - 34.0 pg   MCHC 31.6 30.0 - 36.0 g/dL   RDW 13.6 11.5 - 15.5 %   Platelets 234 150 - 400 K/uL   nRBC 0.0 0.0 - 0.2 %  Respiratory Panel by RT PCR (Flu A&B, Covid) - Nasopharyngeal Swab     Status: None   Collection Time: 11/17/19  5:32 PM   Specimen: Nasopharyngeal Swab  Result Value Ref Range   SARS Coronavirus 2 by RT PCR NEGATIVE NEGATIVE   Influenza A by PCR NEGATIVE NEGATIVE   Influenza B by PCR NEGATIVE NEGATIVE  Urinalysis, Routine w reflex microscopic Urine, Clean Catch     Status: Abnormal   Collection Time: 11/17/19  5:44 PM  Result Value Ref Range   Color, Urine YELLOW YELLOW  APPearance HAZY (A) CLEAR   Specific Gravity, Urine 1.008 1.005 - 1.030   pH 6.0 5.0 - 8.0   Glucose, UA NEGATIVE NEGATIVE mg/dL   Hgb urine dipstick NEGATIVE NEGATIVE   Bilirubin Urine NEGATIVE NEGATIVE   Ketones, ur NEGATIVE NEGATIVE mg/dL   Protein, ur 30 (A) NEGATIVE mg/dL   Nitrite NEGATIVE NEGATIVE   Leukocytes,Ua LARGE (A) NEGATIVE   RBC / HPF 0-5 0 - 5 RBC/hpf   WBC, UA 21-50 0 - 5 WBC/hpf   Bacteria, UA FEW (A) NONE SEEN   Squamous Epithelial / LPF 0-5 0 - 5  CBC     Status: Abnormal   Collection Time: 11/18/19 12:07 AM  Result Value Ref Range   WBC 6.5 4.0 - 10.5 K/uL    RBC 3.60 (L) 3.87 - 5.11 MIL/uL   Hemoglobin 11.1 (L) 12.0 - 15.0 g/dL   HCT 34.3 (L) 36 - 46 %   MCV 95.3 80.0 - 100.0 fL   MCH 30.8 26.0 - 34.0 pg   MCHC 32.4 30.0 - 36.0 g/dL   RDW 13.6 11.5 - 15.5 %   Platelets 223 150 - 400 K/uL   nRBC 0.0 0.0 - 0.2 %  Lipid panel     Status: Abnormal   Collection Time: 11/18/19  4:40 AM  Result Value Ref Range   Cholesterol 212 (H) 0 - 200 mg/dL   Triglycerides 90 <150 mg/dL   HDL 62 >40 mg/dL   Total CHOL/HDL Ratio 3.4 RATIO   VLDL 18 0 - 40 mg/dL   LDL Cholesterol 132 (H) 0 - 99 mg/dL  Basic metabolic panel     Status: Abnormal   Collection Time: 11/18/19  4:40 AM  Result Value Ref Range   Sodium 135 135 - 145 mmol/L   Potassium 3.9 3.5 - 5.1 mmol/L   Chloride 100 98 - 111 mmol/L   CO2 21 (L) 22 - 32 mmol/L   Glucose, Bld 88 70 - 99 mg/dL   BUN 21 8 - 23 mg/dL   Creatinine, Ser 1.03 (H) 0.44 - 1.00 mg/dL   Calcium 9.8 8.9 - 10.3 mg/dL   GFR, Estimated 47 (L) >60 mL/min   Anion gap 14 5 - 15  Magnesium     Status: None   Collection Time: 11/18/19  4:40 AM  Result Value Ref Range   Magnesium 2.2 1.7 - 2.4 mg/dL   CT HEAD WO CONTRAST  Result Date: 11/17/2019 CLINICAL DATA:  Acute neuro deficit.  Rule out stroke.  Weakness. EXAM: CT HEAD WITHOUT CONTRAST TECHNIQUE: Contiguous axial images were obtained from the base of the skull through the vertex without intravenous contrast. COMPARISON:  None. FINDINGS: Brain: Mild atrophy. Extensive white matter hypodensity diffusely bilaterally. Negative for acute cortical infarct. Negative for hemorrhage or mass. Vascular: Negative for hyperdense vessel Skull: Negative Sinuses/Orbits: Air-fluid level sphenoid sinus. Remaining sinuses clear. Mastoid clear bilaterally. Bilateral cataract extraction. Other: Multiple skin lesions in the scalp. Calcified dermal lesion right parietal scalp most likely Pilar cyst. IMPRESSION: Atrophy with diffuse white matter disease most likely chronic microvascular ischemia  No acute abnormality. Electronically Signed   By: Franchot Gallo M.D.   On: 11/17/2019 14:52   MR BRAIN WO CONTRAST  Result Date: 11/17/2019 CLINICAL DATA:  86 year old female with acute weakness, imbalance. EXAM: MRI HEAD WITHOUT CONTRAST TECHNIQUE: Multiplanar, multiecho pulse sequences of the brain and surrounding structures were obtained without intravenous contrast. COMPARISON:  Head CT earlier today. FINDINGS: Brain: Linear, patchy restricted diffusion in  the right corona radiata tracking toward the external capsule (series 5, image 75 and series 7, image 62). No other restricted diffusion. Underlying Patchy and confluent bilateral cerebral white matter T2 and FLAIR hyperintensity. Similar signal heterogeneity throughout the bilateral basal ganglia, worse on the right. Occasional chronic micro hemorrhages (left periatrial white matter series 14, image 29). No evidence of acute hemorrhage. No mass effect. No midline shift, evidence of mass lesion, ventriculomegaly, extra-axial collection. Cervicomedullary junction and pituitary are within normal limits. Mild for age signal heterogeneity in the pons. No cortical encephalomalacia identified. Vascular: Major intracranial vascular flow voids are preserved. Skull and upper cervical spine: Cervical spine detailed separately today. Normal visible bone marrow signal. Sinuses/Orbits: Negative, postoperative changes to both globes. Other: Mastoids are well pneumatized. Visible internal auditory structures appear normal. Scalp and face appear negative. IMPRESSION: 1. Acute small vessel infarct in the right corona radiata, external capsule. No associated hemorrhage or mass effect. 2. Underlying moderate to advanced chronic small vessel disease. Electronically Signed   By: Genevie Ann M.D.   On: 11/17/2019 18:44   MR Cervical Spine Wo Contrast  Result Date: 11/17/2019 CLINICAL DATA:  86 year old female with acute weakness, imbalance. EXAM: MRI CERVICAL SPINE WITHOUT  CONTRAST TECHNIQUE: Multiplanar, multisequence MR imaging of the cervical spine was performed. No intravenous contrast was administered. COMPARISON:  Brain MRI today reported separately. FINDINGS: Alignment: Mild degenerative anterolisthesis of C2 on C3, C7 on T1. Overall straightening of cervical lordosis. Vertebrae: Widespread degenerative endplate marrow signal changes in the cervical spine. No marrow edema or evidence of acute osseous abnormality. Benign vertebral body hemangioma at T2 on the right. Cord: No cervical spinal cord signal abnormality despite multilevel degenerative stenosis with some cord mass effect detailed below. Visible upper thoracic cord appears normal. Posterior Fossa, vertebral arteries, paraspinal tissues: Cervicomedullary junction is within normal limits. Brain is detailed separately today. Preserved major vascular flow voids in the neck. Thyromegaly (series 7, image 6 on the right, but no follow-up indicated in this age group (ref: J Am Coll Radiol. 2015 Feb;12(2): 143-50). Disc levels: C2-C3: Anterolisthesis associated with moderate left and severe right facet hypertrophy. Ligament flavum hypertrophy. No spinal stenosis. Moderate to severe bilateral C3 foraminal stenosis greater on the right. C3-C4: Severe disc space loss. Circumferential disc osteophyte complex. Mild to moderate facet and ligament flavum hypertrophy. Mild spinal stenosis. Mild if any cord mass effect. Severe bilateral C4 foraminal stenosis. C4-C5: Severe disc space loss. Circumferential disc osteophyte complex. Mild to moderate posterior element hypertrophy. Mild spinal stenosis and spinal cord mass effect. Severe bilateral C5 foraminal stenosis. C5-C6: Disc space loss with circumferential disc osteophyte complex. Mild to moderate posterior element hypertrophy. No significant spinal stenosis. Moderate left and moderate to severe right C6 foraminal stenosis. C6-C7: Mild disc space loss. Circumferential disc osteophyte  complex. Moderate facet and ligament flavum hypertrophy. No significant spinal stenosis. Moderate bilateral C7 foraminal stenosis. C7-T1: Mild anterolisthesis. Mild to moderate posterior element hypertrophy. No significant stenosis. IMPRESSION: 1. No acute osseous abnormality in the cervical spine. Mild degenerative spondylolisthesis at C2-C3 and C7-T1 with facet arthropathy. 2. Widespread cervical spine degeneration. Mild spinal stenosis at C3-C4 and C4-C5 with up to mild spinal cord mass effect, but no cord signal abnormality. 3. Widespread moderate and severe degenerative neural foraminal stenosis, only sparing the C8 nerve level. Electronically Signed   By: Genevie Ann M.D.   On: 11/17/2019 18:56   CT ANGIO HEAD CODE STROKE  Result Date: 11/17/2019 CLINICAL DATA:  TIA EXAM: CT ANGIOGRAPHY  HEAD AND NECK TECHNIQUE: Multidetector CT imaging of the head and neck was performed using the standard protocol during bolus administration of intravenous contrast. Multiplanar CT image reconstructions and MIPs were obtained to evaluate the vascular anatomy. Carotid stenosis measurements (when applicable) are obtained utilizing NASCET criteria, using the distal internal carotid diameter as the denominator. CONTRAST:  10mL OMNIPAQUE IOHEXOL 350 MG/ML SOLN COMPARISON:  None. FINDINGS: CTA NECK FINDINGS SKELETON: There is no bony spinal canal stenosis. No lytic or blastic lesion. OTHER NECK: Heterogeneous and enlarged right thyroid lobe measuring 3 1 x 2.5 cm. UPPER CHEST: Biapical emphysema AORTIC ARCH: There is calcific atherosclerosis of the aortic arch. There is no aneurysm, dissection or hemodynamically significant stenosis of the visualized portion of the aorta. Conventional 3 vessel aortic branching pattern. The visualized proximal subclavian arteries are widely patent. RIGHT CAROTID SYSTEM: No dissection, occlusion or aneurysm. There is predominantly calcified atherosclerosis extending into the proximal ICA, resulting in  50% stenosis. LEFT CAROTID SYSTEM: No dissection, occlusion or aneurysm. Mild atherosclerotic calcification at the carotid bifurcation without hemodynamically significant stenosis. VERTEBRAL ARTERIES: Codominant configuration. Both origins are clearly patent. There is no dissection, occlusion or flow-limiting stenosis to the skull base (V1-V3 segments). CTA HEAD FINDINGS POSTERIOR CIRCULATION: --Vertebral arteries: Normal V4 segments. --Inferior cerebellar arteries: Normal. --Basilar artery: Normal. --Superior cerebellar arteries: Normal. --Posterior cerebral arteries (PCA): Normal. ANTERIOR CIRCULATION: --Intracranial internal carotid arteries: Atherosclerotic calcification of the internal carotid arteries at the skull base without hemodynamically significant stenosis. --Anterior cerebral arteries (ACA): Normal. Both A1 segments are present. Patent anterior communicating artery (a-comm). --Middle cerebral arteries (MCA): Normal. VENOUS SINUSES: As permitted by contrast timing, patent. ANATOMIC VARIANTS: None Review of the MIP images confirms the above findings. IMPRESSION: 1. No intracranial arterial occlusion or high-grade stenosis. 2. Approximately 50% stenosis of the proximal right internal carotid artery secondary to predominantly calcified atherosclerosis. 3. Heterogeneous and enlarged right thyroid lobe measuring 3 1 x 2.5 cm. Recommend thyroid ultrasound (ref: J Am Coll Radiol. 2015 Feb;12(2): 143-50). Aortic Atherosclerosis (ICD10-I70.0) and Emphysema (ICD10-J43.9). Electronically Signed   By: Ulyses Jarred M.D.   On: 11/17/2019 21:51   CT ANGIO NECK CODE STROKE  Result Date: 11/17/2019 CLINICAL DATA:  TIA EXAM: CT ANGIOGRAPHY HEAD AND NECK TECHNIQUE: Multidetector CT imaging of the head and neck was performed using the standard protocol during bolus administration of intravenous contrast. Multiplanar CT image reconstructions and MIPs were obtained to evaluate the vascular anatomy. Carotid stenosis  measurements (when applicable) are obtained utilizing NASCET criteria, using the distal internal carotid diameter as the denominator. CONTRAST:  61mL OMNIPAQUE IOHEXOL 350 MG/ML SOLN COMPARISON:  None. FINDINGS: CTA NECK FINDINGS SKELETON: There is no bony spinal canal stenosis. No lytic or blastic lesion. OTHER NECK: Heterogeneous and enlarged right thyroid lobe measuring 3 1 x 2.5 cm. UPPER CHEST: Biapical emphysema AORTIC ARCH: There is calcific atherosclerosis of the aortic arch. There is no aneurysm, dissection or hemodynamically significant stenosis of the visualized portion of the aorta. Conventional 3 vessel aortic branching pattern. The visualized proximal subclavian arteries are widely patent. RIGHT CAROTID SYSTEM: No dissection, occlusion or aneurysm. There is predominantly calcified atherosclerosis extending into the proximal ICA, resulting in 50% stenosis. LEFT CAROTID SYSTEM: No dissection, occlusion or aneurysm. Mild atherosclerotic calcification at the carotid bifurcation without hemodynamically significant stenosis. VERTEBRAL ARTERIES: Codominant configuration. Both origins are clearly patent. There is no dissection, occlusion or flow-limiting stenosis to the skull base (V1-V3 segments). CTA HEAD FINDINGS POSTERIOR CIRCULATION: --Vertebral arteries: Normal V4 segments. --Inferior cerebellar  arteries: Normal. --Basilar artery: Normal. --Superior cerebellar arteries: Normal. --Posterior cerebral arteries (PCA): Normal. ANTERIOR CIRCULATION: --Intracranial internal carotid arteries: Atherosclerotic calcification of the internal carotid arteries at the skull base without hemodynamically significant stenosis. --Anterior cerebral arteries (ACA): Normal. Both A1 segments are present. Patent anterior communicating artery (a-comm). --Middle cerebral arteries (MCA): Normal. VENOUS SINUSES: As permitted by contrast timing, patent. ANATOMIC VARIANTS: None Review of the MIP images confirms the above findings.  IMPRESSION: 1. No intracranial arterial occlusion or high-grade stenosis. 2. Approximately 50% stenosis of the proximal right internal carotid artery secondary to predominantly calcified atherosclerosis. 3. Heterogeneous and enlarged right thyroid lobe measuring 3 1 x 2.5 cm. Recommend thyroid ultrasound (ref: J Am Coll Radiol. 2015 Feb;12(2): 143-50). Aortic Atherosclerosis (ICD10-I70.0) and Emphysema (ICD10-J43.9). Electronically Signed   By: Ulyses Jarred M.D.   On: 11/17/2019 21:51     Assessment/Plan: Diagnosis: R basal ganglia infarct 1. Does the need for close, 24 hr/day medical supervision in concert with the patient's rehab needs make it unreasonable for this patient to be served in a less intensive setting? Yes 2. Co-Morbidities requiring supervision/potential complications: hyperlipidemia, mild spondylolisthesis at C2-C3, C7-T1, widespread moderate and severe degenerative foraminal stenosis, hypertensive urgency 3. Due to bladder management, bowel management, safety, skin/wound care, disease management, medication administration, pain management and patient education, does the patient require 24 hr/day rehab nursing? Yes 4. Does the patient require coordinated care of a physician, rehab nurse, therapy disciplines of PT, OT to address physical and functional deficits in the context of the above medical diagnosis(es)? Yes Addressing deficits in the following areas: balance, endurance, locomotion, strength, transferring, bowel/bladder control, bathing, dressing, feeding, grooming, toileting and psychosocial support 5. Can the patient actively participate in an intensive therapy program of at least 3 hrs of therapy per day at least 5 days per week? Yes 6. The potential for patient to make measurable gains while on inpatient rehab is excellent 7. Anticipated functional outcomes upon discharge from inpatient rehab are modified independent  with PT, modified independent with OT, independent with  SLP. 8. Estimated rehab length of stay to reach the above functional goals is: 10-14 days 9. Anticipated discharge destination: Home 10. Overall Rehab/Functional Prognosis: excellent  RECOMMENDATIONS: This patient's condition is appropriate for continued rehabilitative care in the following setting: CIR Patient has agreed to participate in recommended program. Yes Note that insurance prior authorization may be required for reimbursement for recommended care.  Comment: Thank you for this consult. Admission coordinator to follow.   I have personally performed a face to face diagnostic evaluation, including, but not limited to relevant history and physical exam findings, of this patient and developed relevant assessment and plan.  Additionally, I have reviewed and concur with the physician assistant's documentation above.  Leeroy Cha, MD  Lavon Paganini [facility], PA-C 11/18/2019

## 2019-11-18 NOTE — Progress Notes (Signed)
Occupational Therapy Evaluation Patient Details Name: Cindy Smith MRN: [id number] DOB: 1923/10/02 Today's Date: 11/18/2019    History of Present Illness 86yo female presenting with difficulty walking, vision changes, numbness and tingling BLEs. BP 220/70s in ED. 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 No tpa given. PMH HTN, chronic L knee injury, colon CA s/p colectomy   Clinical Impression  PTA, patient was living alone in a private residence and was independent with BADLs including sponge bathing at sink level, dressing, and toileting with use of rollator. Patient reports independence with microwaving meals but required assistance with grocery shopping. Patient was not driving. Patient currently presents below baseline level of function demonstrating Min A to Mod A for BADL transfers and LB BADLs. Patient also requiring Mod A for functional mobility with RW with assist for walker management and cues to avoid obstacles in L visual field. Patient would benefit from continued acute OT services to maximize safety and independence with self-care tasks and reduce fall risk in prep for d/c to next level of care. Patient would benefit from intensive inpatient rehab prior to return home with supervision/assist from family.       Follow Up Recommendations  CIR    Equipment Recommendations  Other (comment) (Defer to next level of care)    Recommendations for Other Services Rehab consult     Precautions / Restrictions Precautions Precautions: Fall;Other (comment) Precaution Comments: chronic L knee injury, watch BP (out of permissive HTN window per MD), incontinent of urine and stool Restrictions Weight Bearing Restrictions: No      Mobility Bed Mobility Overal bed mobility: Needs Assistance Bed Mobility: Supine to Sit     Supine to sit: Min assist;HOB elevated     General bed mobility comments: increased time and effort, minA for assist with L  LE intermittently  Transfers Overall transfer level: Needs assistance Equipment used: Rolling walker (2 wheeled) Transfers: Sit to/from [facility] Sit to Stand: Min assist;Mod assist Stand pivot transfers: Mod assist       General transfer comment: fluctuating between Min-ModA dependent on fatigue levels, max cues for hand placement and safety; ModA to pivot over to [facility] while controlling RW and maintaining balance    Balance Overall balance assessment: Needs assistance Sitting-balance support: Bilateral upper extremity supported;Feet supported Sitting balance-Leahy Scale: Fair Sitting balance - Comments: min guard at EOB   Standing balance support: Bilateral upper extremity supported;During functional activity Standing balance-Leahy Scale: Poor Standing balance comment: heavy reliance on BUE support, up to MinA dynamically                           ADL either performed or assessed with clinical judgement   ADL Overall ADL's : Needs assistance/impaired Eating/Feeding: Set up;Sitting               Upper Body Dressing : Minimal assistance;Sitting Upper Body Dressing Details (indicate cue type and reason): To don posterior gown seated EOB.  Lower Body Dressing: Moderate assistance;Sitting/lateral leans;Sit to/from stand   Toilet Transfer: Moderate assistance Toilet Transfer Details (indicate cue type and reason): Mod A with cues for hand placement.  Toileting- Clothing Manipulation and Hygiene: Moderate assistance Toileting - Clothing Manipulation Details (indicate cue type and reason): Mod A for clothing/hygiene management              Vision Baseline Vision/History: Wears glasses Wears Glasses: Reading only Patient Visual Report: Blurring of vision Vision  Assessment?: Vision impaired- to be further tested in functional context Additional Comments: Decreased attention to L visual filed with cues to avoid obstacles on L.      Perception      Praxis      Pertinent Vitals/Pain Pain Assessment: No/denies pain     Hand Dominance Right   Extremity/Trunk Assessment Upper Extremity Assessment Upper Extremity Assessment: Generalized weakness   Lower Extremity Assessment Lower Extremity Assessment: Defer to PT evaluation RLE Sensation: history of peripheral neuropathy LLE Deficits / Details: knee in valgus and adducted position, clearly favors L LE with mobility LLE Sensation: history of peripheral neuropathy LLE Coordination: decreased gross motor   Cervical / Trunk Assessment Cervical / Trunk Assessment: Kyphotic;Other exceptions (Scoliosis of thoracic and lumbar spine)   Communication Communication Communication: HOH   Cognition Arousal/Alertness: Awake/alert Behavior During Therapy: WFL for tasks assessed/performed Overall Cognitive Status: Within Functional Limits for tasks assessed                                 General Comments: perhaps a bit HOH and with difficulty understanding through masks/face shield but WNL otherwise   General Comments  BP no more than 170/60s during session, seemed to stabilize at 160s/60-70s    Exercises     Shoulder Instructions      Home Living Family/patient expects to be discharged to:: Private residence Living Arrangements: Alone Available Help at Discharge: Family;Available PRN/intermittently;Neighbor;Other (Comment) (grandchildren) Type of Home: House Home Access: Stairs to enter [employer] of Steps: 1 with no rail Entrance Stairs-Rails: None Home Layout: One level     Bathroom Shower/Tub: Other (comment) (takes birdbaths at the sink)         Home Equipment: Walker - 4 wheels   Additional Comments: has had neuropathy for awhile, about 4 yars ago she hurt her knee and never got it evaluated  Lives With: Alone    Prior Functioning/Environment Level of Independence: Independent with assistive device(s)                 OT  Problem List: Decreased strength;Decreased range of motion;Decreased activity tolerance;Impaired balance (sitting and/or standing)      OT Treatment/Interventions: Self-care/ADL training;Therapeutic exercise;Neuromuscular education;Energy conservation;DME and/or AE instruction;Therapeutic activities;Visual/perceptual remediation/compensation;Patient/family education;Balance training    OT Goals(Current goals can be found in the care plan section) Acute Rehab OT Goals Patient Stated Goal: CIR OT Goal Formulation: With patient Time For Goal Achievement: 12/02/19 Potential to Achieve Goals: Good  OT Frequency: Min 2X/week   Barriers to D/C:            Co-evaluation              AM-PAC OT "6 Clicks" Daily Activity     Outcome Measure Help from another person eating meals?: A Little Help from another person taking care of personal grooming?: A Little Help from another person toileting, which includes using toliet, bedpan, or urinal?: A Lot Help from another person bathing (including washing, rinsing, drying)?: A Lot Help from another person to put on and taking off regular upper body clothing?: A Little Help from another person to put on and taking off regular lower body clothing?: A Lot 6 Click Score: 15   End of Session Equipment Utilized During Treatment: Gait belt;Rolling walker  Activity Tolerance: Patient tolerated treatment well Patient left: in bed;with call bell/phone within reach;with bed alarm set;with family/visitor present  OT Visit Diagnosis: Unsteadiness on feet (  R26.81);Muscle weakness (generalized) (M62.81);Other symptoms and signs involving cognitive function                Time: 1419-1456 OT Time Calculation (min): 37 min Charges:  OT General Charges $OT Visit: 1 Visit OT Evaluation $OT Eval Moderate Complexity: 1 Mod OT Treatments $Self Care/Home Management : 8-22 mins   Saylah Ketner H. OTR/L Supplemental OT, Department of rehab services  [phone]  Deshannon Hinchliffe R H. 11/18/2019, 3:16 PM

## 2019-11-19 ENCOUNTER — Inpatient Hospital Stay (HOSPITAL_COMMUNITY)
Admission: RE | Admit: 2019-11-19 | Payer: Medicare Other | Source: Intra-hospital | Admitting: Physical Medicine & Rehabilitation

## 2019-11-19 ENCOUNTER — Other Ambulatory Visit: Payer: Self-pay | Admitting: Medical

## 2019-11-19 ENCOUNTER — Encounter (HOSPITAL_COMMUNITY): Payer: Self-pay | Admitting: Physical Medicine & Rehabilitation

## 2019-11-19 ENCOUNTER — Inpatient Hospital Stay (HOSPITAL_COMMUNITY)
Admission: RE | Admit: 2019-11-19 | Discharge: 2019-12-01 | DRG: 057 | Disposition: A | Discharge status: Home Health | Payer: Medicare Other | Source: Intra-hospital | Attending: Physical Medicine & Rehabilitation | Admitting: Physical Medicine & Rehabilitation

## 2019-11-19 ENCOUNTER — Other Ambulatory Visit: Payer: Self-pay

## 2019-11-19 DIAGNOSIS — I69398 Other sequelae of cerebral infarction: Secondary | ICD-10-CM | POA: Diagnosis not present

## 2019-11-19 DIAGNOSIS — D638 Anemia in other chronic diseases classified elsewhere: Secondary | ICD-10-CM

## 2019-11-19 DIAGNOSIS — S8992XD Unspecified injury of left lower leg, subsequent encounter: Secondary | ICD-10-CM

## 2019-11-19 DIAGNOSIS — E0590 Thyrotoxicosis, unspecified without thyrotoxic crisis or storm: Secondary | ICD-10-CM | POA: Diagnosis present

## 2019-11-19 DIAGNOSIS — K5900 Constipation, unspecified: Secondary | ICD-10-CM | POA: Diagnosis present

## 2019-11-19 DIAGNOSIS — I129 Hypertensive chronic kidney disease with stage 1 through stage 4 chronic kidney disease, or unspecified chronic kidney disease: Secondary | ICD-10-CM | POA: Diagnosis present

## 2019-11-19 DIAGNOSIS — R269 Unspecified abnormalities of gait and mobility: Secondary | ICD-10-CM | POA: Diagnosis present

## 2019-11-19 DIAGNOSIS — Z79899 Other long term (current) drug therapy: Secondary | ICD-10-CM

## 2019-11-19 DIAGNOSIS — I639 Cerebral infarction, unspecified: Secondary | ICD-10-CM | POA: Diagnosis not present

## 2019-11-19 DIAGNOSIS — N1831 Chronic kidney disease, stage 3a: Secondary | ICD-10-CM

## 2019-11-19 DIAGNOSIS — Z8249 Family history of ischemic heart disease and other diseases of the circulatory system: Secondary | ICD-10-CM

## 2019-11-19 DIAGNOSIS — I10 Essential (primary) hypertension: Secondary | ICD-10-CM

## 2019-11-19 DIAGNOSIS — X58XXXD Exposure to other specified factors, subsequent encounter: Secondary | ICD-10-CM | POA: Diagnosis present

## 2019-11-19 DIAGNOSIS — L89151 Pressure ulcer of sacral region, stage 1: Secondary | ICD-10-CM | POA: Diagnosis present

## 2019-11-19 DIAGNOSIS — L8990 Pressure ulcer of unspecified site, unspecified stage: Secondary | ICD-10-CM | POA: Insufficient documentation

## 2019-11-19 DIAGNOSIS — Z7902 Long term (current) use of antithrombotics/antiplatelets: Secondary | ICD-10-CM | POA: Diagnosis not present

## 2019-11-19 DIAGNOSIS — H538 Other visual disturbances: Secondary | ICD-10-CM | POA: Diagnosis present

## 2019-11-19 DIAGNOSIS — Z9049 Acquired absence of other specified parts of digestive tract: Secondary | ICD-10-CM | POA: Diagnosis not present

## 2019-11-19 DIAGNOSIS — M1712 Unilateral primary osteoarthritis, left knee: Secondary | ICD-10-CM | POA: Diagnosis not present

## 2019-11-19 DIAGNOSIS — M62838 Other muscle spasm: Secondary | ICD-10-CM | POA: Diagnosis not present

## 2019-11-19 DIAGNOSIS — Z885 Allergy status to narcotic agent status: Secondary | ICD-10-CM

## 2019-11-19 DIAGNOSIS — E1122 Type 2 diabetes mellitus with diabetic chronic kidney disease: Secondary | ICD-10-CM | POA: Diagnosis present

## 2019-11-19 DIAGNOSIS — Z7982 Long term (current) use of aspirin: Secondary | ICD-10-CM | POA: Diagnosis not present

## 2019-11-19 DIAGNOSIS — Z923 Personal history of irradiation: Secondary | ICD-10-CM

## 2019-11-19 DIAGNOSIS — M62830 Muscle spasm of back: Secondary | ICD-10-CM | POA: Diagnosis present

## 2019-11-19 DIAGNOSIS — Z888 Allergy status to other drugs, medicaments and biological substances status: Secondary | ICD-10-CM

## 2019-11-19 DIAGNOSIS — M238X2 Other internal derangements of left knee: Secondary | ICD-10-CM | POA: Diagnosis present

## 2019-11-19 DIAGNOSIS — H9190 Unspecified hearing loss, unspecified ear: Secondary | ICD-10-CM | POA: Diagnosis present

## 2019-11-19 DIAGNOSIS — R159 Full incontinence of feces: Secondary | ICD-10-CM | POA: Diagnosis present

## 2019-11-19 DIAGNOSIS — G629 Polyneuropathy, unspecified: Secondary | ICD-10-CM | POA: Diagnosis present

## 2019-11-19 DIAGNOSIS — E785 Hyperlipidemia, unspecified: Secondary | ICD-10-CM | POA: Diagnosis present

## 2019-11-19 DIAGNOSIS — Z85038 Personal history of other malignant neoplasm of large intestine: Secondary | ICD-10-CM | POA: Diagnosis not present

## 2019-11-19 DIAGNOSIS — M21062 Valgus deformity, not elsewhere classified, left knee: Secondary | ICD-10-CM | POA: Diagnosis present

## 2019-11-19 DIAGNOSIS — R7989 Other specified abnormal findings of blood chemistry: Secondary | ICD-10-CM | POA: Diagnosis not present

## 2019-11-19 DIAGNOSIS — Z1212 Encounter for screening for malignant neoplasm of rectum: Secondary | ICD-10-CM | POA: Diagnosis not present

## 2019-11-19 DIAGNOSIS — E042 Nontoxic multinodular goiter: Secondary | ICD-10-CM | POA: Diagnosis present

## 2019-11-19 HISTORY — DX: Thyrotoxicosis, unspecified without thyrotoxic crisis or storm: E05.90

## 2019-11-19 LAB — HEMOGLOBIN A1C
Hgb A1c MFr Bld: 5.4 % (ref 4.8–5.6)
Mean Plasma Glucose: 108 mg/dL

## 2019-11-19 LAB — TSH: TSH: 5.695 u[IU]/mL — ABNORMAL HIGH (ref 0.350–4.500)

## 2019-11-19 MED ORDER — ASPIRIN 81 MG PO TBEC: 81.0000 mg | DELAYED_RELEASE_TABLET | Freq: Every day | ORAL | 0 refills | Status: AC

## 2019-11-19 MED ORDER — AMLODIPINE BESYLATE 10 MG PO TABS
10.0000 mg | ORAL_TABLET | Freq: Every day | ORAL | 0 refills | Status: DC
Start: 2019-11-20 — End: 2019-12-01

## 2019-11-19 MED ORDER — ACETAMINOPHEN 325 MG PO TABS: 650.0000 mg | ORAL_TABLET | ORAL | Status: DC | PRN

## 2019-11-19 MED ORDER — ATORVASTATIN CALCIUM 40 MG PO TABS
40.0000 mg | ORAL_TABLET | Freq: Every day | ORAL | Status: DC
  Administered 2019-11-20 – 2019-12-01 (×12): 40 mg via ORAL
  Filled 2019-11-19 (×12): qty 1

## 2019-11-19 MED ORDER — ATENOLOL 25 MG PO TABS
25.0000 mg | ORAL_TABLET | Freq: Every day | ORAL | Status: DC
  Administered 2019-11-19 – 2019-11-30 (×12): 25 mg via ORAL
  Filled 2019-11-19 (×14): qty 1

## 2019-11-19 MED ORDER — LATANOPROST 0.005 % OP SOLN
1.0000 [drp] | Freq: Every evening | OPHTHALMIC | Status: DC
  Administered 2019-11-19 – 2019-11-30 (×12): 1 [drp] via OPHTHALMIC
  Filled 2019-11-19: qty 2.5

## 2019-11-19 MED ORDER — CLOPIDOGREL BISULFATE 75 MG PO TABS
75.0000 mg | ORAL_TABLET | Freq: Every day | ORAL | Status: DC
  Administered 2019-11-20 – 2019-12-01 (×12): 75 mg via ORAL
  Filled 2019-11-19 (×12): qty 1

## 2019-11-19 MED ORDER — HYDRALAZINE HCL 25 MG PO TABS
25.0000 mg | ORAL_TABLET | Freq: Four times a day (QID) | ORAL | Status: DC | PRN
  Administered 2019-11-20: 25 mg via ORAL
  Filled 2019-11-19: qty 1

## 2019-11-19 MED ORDER — AMLODIPINE BESYLATE 10 MG PO TABS
10.0000 mg | ORAL_TABLET | Freq: Every day | ORAL | Status: DC
  Administered 2019-11-19: 10 mg via ORAL
  Filled 2019-11-19: qty 1

## 2019-11-19 MED ORDER — ASPIRIN EC 81 MG PO TBEC
81.0000 mg | DELAYED_RELEASE_TABLET | Freq: Every day | ORAL | Status: DC
  Administered 2019-11-20 – 2019-12-01 (×12): 81 mg via ORAL
  Filled 2019-11-19 (×12): qty 1

## 2019-11-19 MED ORDER — ACETAMINOPHEN 160 MG/5ML PO SOLN: 650.0000 mg | ORAL | Status: DC | PRN

## 2019-11-19 MED ORDER — ACETAMINOPHEN 650 MG RE SUPP: 650.0000 mg | RECTAL | Status: DC | PRN

## 2019-11-19 MED ORDER — AMLODIPINE BESYLATE 10 MG PO TABS
10.0000 mg | ORAL_TABLET | Freq: Every day | ORAL | Status: DC
  Administered 2019-11-20 – 2019-12-01 (×12): 10 mg via ORAL
  Filled 2019-11-19 (×12): qty 1

## 2019-11-19 MED ORDER — CLOPIDOGREL BISULFATE 75 MG PO TABS
75.0000 mg | ORAL_TABLET | Freq: Every day | ORAL | 0 refills | Status: DC
Start: 2019-11-19 — End: 2019-12-01

## 2019-11-19 MED ORDER — VITAMIN D 25 MCG (1000 UNIT) PO TABS
1000.0000 [IU] | ORAL_TABLET | Freq: Every day | ORAL | Status: DC
  Administered 2019-11-19 – 2019-12-01 (×13): 1000 [IU] via ORAL
  Filled 2019-11-19 (×12): qty 1

## 2019-11-19 MED ORDER — VITAMIN B-12 1000 MCG PO TABS
1000.0000 ug | ORAL_TABLET | Freq: Every day | ORAL | Status: DC
  Administered 2019-11-19 – 2019-12-01 (×13): 1000 ug via ORAL
  Filled 2019-11-19 (×13): qty 1

## 2019-11-19 MED ORDER — ATORVASTATIN CALCIUM 40 MG PO TABS: 40.0000 mg | ORAL_TABLET | Freq: Every day | ORAL | 0 refills | Status: DC

## 2019-11-19 NOTE — Progress Notes (Signed)
Signed     PMR Admission Coordinator Pre-Admission Assessment   Patient: Cindy Smith is an 86 y.o., female MRN: [id number] DOB: 1923-07-01 Height: 5' (152.4 cm) Weight: 59.1 kg                                                                                                                                                  Insurance Information HMO:     PPO:      PCP:      IPA:      80/20: yes     OTHER:  PRIMARY: Medicare A & B      Policy#: [id number]      Subscriber: patient CM Name:       Phone#:      Fax#:  Pre-Cert#:       Employer:  Benefits:  Phone #: verified eligibility online via [employer] on 11/19/19     Name:  Eff. Date: Part A & Part B effective 01/05/91     Deduct: $1,484      Out of Pocket Max: NA      Life Max: NA  CIR: 100% with Medicare approval      SNF: 100% (days 1-20), 80% (days 21-100) Outpatient: 80%     Co-Pay: 20% Home Health: 100%      Co-Pay:  DME: 80%     Co-Pay: 20% Providers: pt's choice SECONDARY: BCBS Supplement      Policy#: [id number]      Phone#: [phone]   Financial Counselor:       Phone#:    The "Data Collection Information Summary" for patients in Inpatient Rehabilitation Facilities with attached "Privacy Act [place] Records" was provided and verbally reviewed with: Patient and Family   Emergency Contact Information         Contact Information     Name Relation Home Work Mobile    Burney,Lisa Daughter     [phone]       Current Medical History  Patient Admitting Diagnosis: R basal ganglia infarct   History of Present Illness:  Pt is a 86 y.o. right-handed female with history of hypertension as well as colon cancer with colectomy.  Per chart review patient lives alone.  Independent with assistive device.  1 level home one-step to entry.  She does have family to check on her routinely.  Presented 11/17/2019 with blurred vision and gait disturbance.  CT/MRI showed acute small vessel infarct in the right corona  radiata external capsule.  No associated hemorrhage.  CT angiogram of head and neck with no intracranial occlusion or high-grade stenosis.  Echocardiogram pending.  Admission chemistry sodium 133 glucose 108 creatinine 1.10, urinalysis negative nitrite.  Currently maintained on aspirin and Plavix for CVA prophylaxis.  Therapy evaluation completed with recommendations of physical medicine rehab consult.   Complete NIHSS TOTAL: 2 Glasgow Coma  Scale Score: 14   Past Medical History      Past Medical History:  Diagnosis Date  . HTN (hypertension)        Family History  family history is not on file.   Prior Rehab/Hospitalizations:  Has the patient had prior rehab or hospitalizations prior to admission? No   Has the patient had major surgery during 100 days prior to admission? No   Current Medications    Current Facility-Administered Medications:  .  acetaminophen (TYLENOL) tablet 650 mg, 650 mg, Oral, Q4H PRN **OR** acetaminophen (TYLENOL) 160 MG/5ML solution 650 mg, 650 mg, Per Tube, Q4H PRN **OR** acetaminophen (TYLENOL) suppository 650 mg, 650 mg, Rectal, Q4H PRN, Pahwani, Rinka R, MD .  amLODipine (NORVASC) tablet 10 mg, 10 mg, Oral, Daily, Pahwani, Ravi, MD, 10 mg at 11/19/19 0809 .  aspirin EC tablet 81 mg, 81 mg, Oral, Daily, Pahwani, Rinka R, MD, 81 mg at 11/19/19 0808 .  atorvastatin (LIPITOR) tablet 40 mg, 40 mg, Oral, Daily, Pahwani, Ravi, MD, 40 mg at 11/19/19 0809 .  [COMPLETED] clopidogrel (PLAVIX) tablet 300 mg, 300 mg, Oral, Once, 300 mg at 11/18/19 0402 **AND** clopidogrel (PLAVIX) tablet 75 mg, 75 mg, Oral, Daily, Greta Doom, MD, 75 mg at 11/19/19 0809 .  hydrALAZINE (APRESOLINE) injection 10 mg, 10 mg, Intravenous, Q4H PRN, Shela Leff, MD, 10 mg at 11/18/19 0402 .  latanoprost (XALATAN) 0.005 % ophthalmic solution 1 drop, 1 drop, Both Eyes, QPM, Shela Leff, MD, 1 drop at 11/18/19 1857   Patients Current Diet:     Diet Order                       Diet Heart Room service appropriate? Yes; Fluid consistency: Thin  Diet effective now                      Precautions / Restrictions Precautions Precautions: Fall, Other (comment) Precaution Comments: chronic L knee injury, watch BP (out of permissive HTN window per MD), incontinent of urine and stool Restrictions Weight Bearing Restrictions: No    Has the patient had 2 or more falls or a fall with injury in the past year?No   Prior Activity Level Household: only leaves house for doctor's appointments   Prior Functional Level Prior Function Level of Independence: Independent with assistive device(s)   Self Care: Did the patient need help bathing, dressing, using the toilet or eating?  Independent   Indoor Mobility: Did the patient need assistance with walking from room to room (with or without device)? Independent   Stairs: Did the patient need assistance with internal or external stairs (with or without device)? Pt reported has no steps that she has to go up or down   Functional Cognition: Did the patient need help planning regular tasks such as shopping or remembering to take medications? Independent   Home Assistive Devices / Equipment Home Equipment: Walker - 4 wheels   Prior Device Use: Indicate devices/aids used by the patient prior to current illness, exacerbation or injury? Rollator walker   Current Functional Level Cognition   Arousal/Alertness: Awake/alert Overall Cognitive Status: Within Functional Limits for tasks assessed Orientation Level: Oriented X4 General Comments: perhaps a bit HOH and with difficulty understanding through masks/face shield but WNL otherwise Attention: Focused, Sustained, Selective Focused Attention: Appears intact Sustained Attention: Appears intact Selective Attention: Appears intact Memory: Appears intact Awareness: Appears intact Problem Solving: Appears intact Executive Function: Organizing, Decision Making, Sequencing,  Reasoning Reasoning: Appears intact Sequencing: Appears intact Organizing: Appears intact Decision Making: Appears intact Safety/Judgment: Appears intact    Extremity Assessment (includes Sensation/Coordination)   Upper Extremity Assessment: Generalized weakness  Lower Extremity Assessment: Defer to PT evaluation RLE Sensation: history of peripheral neuropathy LLE Deficits / Details: knee in valgus and adducted position, clearly favors L LE with mobility LLE Sensation: history of peripheral neuropathy LLE Coordination: decreased gross motor     ADLs   Overall ADL's : Needs assistance/impaired Eating/Feeding: Set up, Sitting Upper Body Dressing : Minimal assistance, Sitting Upper Body Dressing Details (indicate cue type and reason): To don posterior gown seated EOB.  Lower Body Dressing: Moderate assistance, Sitting/lateral leans, Sit to/from stand Toilet Transfer: Moderate assistance Toilet Transfer Details (indicate cue type and reason): Mod A with cues for hand placement.  Toileting- Clothing Manipulation and Hygiene: Moderate assistance Toileting - Clothing Manipulation Details (indicate cue type and reason): Mod A for clothing/hygiene management      Mobility   Overal bed mobility: Needs Assistance Bed Mobility: Supine to Sit Supine to sit: Min assist, HOB elevated General bed mobility comments: increased time and effort, minA for assist with L LE intermittently     Transfers   Overall transfer level: Needs assistance Equipment used: Rolling walker (2 wheeled) Transfers: Sit to/from Stand, [employer] Transfers Sit to Stand: Min assist, Mod assist Stand pivot transfers: Mod assist General transfer comment: fluctuating between Min-ModA dependent on fatigue levels, max cues for hand placement and safety; ModA to pivot over to [facility] while controlling RW and maintaining balance     Ambulation / Gait / Stairs / Wheelchair Mobility   Ambulation/Gait General Gait Details:  deferred due to stool incontinence- but able to take small shuffle steps during transfers     Posture / Balance Dynamic Sitting Balance Sitting balance - Comments: min guard at EOB Balance Overall balance assessment: Needs assistance Sitting-balance support: Bilateral upper extremity supported, Feet supported Sitting balance-Leahy Scale: Fair Sitting balance - Comments: min guard at EOB Standing balance support: Bilateral upper extremity supported, During functional activity Standing balance-Leahy Scale: Poor Standing balance comment: heavy reliance on BUE support, up to MinA dynamically     Special needs/care consideration Designated visitor Riley Churches, daughter, Bladder incontinence        Previous Home Environment (from acute therapy documentation) Living Arrangements: Alone  Lives With: Alone Available Help at Discharge: Family, Available PRN/intermittently Type of Home: [place]: One level Home Access: Stairs to enter, Other (comment) (half step to enter) Entrance Stairs-Rails: None Entrance Stairs-Number of Steps: half step  Bathroom Shower/Tub: Other (comment) (pt does something similar to bed baths, but at the sink) Biochemist, clinical: Standard Bathroom Accessibility: No Home Care Services: No Additional Comments: has had neuropathy for awhile, about 4 yars ago she hurt her knee and never got it evaluated   Discharge Living Setting Plans for Discharge Living Setting: Patient's home Type of Home at Discharge: House Discharge Home Access: Stairs to enter Entrance Stairs-Rails: None Entrance Stairs-Number of Steps: half step Discharge Bathroom Shower/Tub: Other (comment) (pt does something similar to bed baths, but at sink) Discharge Bathroom Toilet: Standard Discharge Bathroom Accessibility: No Does the patient have any problems obtaining your medications?: No   Social/Family/Support Systems Anticipated Caregiver: Riley Churches, daughter Anticipated Caregiver's  Contact Information: [phone] Caregiver Availability: Intermittent Discharge Plan Discussed with Primary Caregiver: Yes Is Caregiver In Agreement with Plan?: Yes Does Caregiver/Family have Issues with Lodging/Transportation while Pt is in Rehab?: No  Goals Patient/Family Goal for Rehab: PT/OT- Mod I Expected length of stay: 10-14 days Pt/Family Agrees to Admission and willing to participate: Yes Program Orientation Provided & Reviewed with Pt/Caregiver Including Roles  & Responsibilities: Yes     Decrease burden of Care through IP rehab admission: NA             Possible need for SNF placement upon discharge:NA     Patient Condition: This patient's condition remains as documented in the consult dated 11/18/19, in which the Rehabilitation Physician determined and documented that the patient's condition is appropriate for intensive rehabilitative care in an inpatient rehabilitation facility. Will admit to inpatient rehab today.   Preadmission Screen Completed By:  Bethel Born, CCC-SLP, 11/19/2019 11:07 AM ______________________________________________________________________   Discussed status with Dr. Ranell Patrick on 11/19/19 11:07 AM at 11:07 AM and received approval for admission today.   Admission Coordinator:  Bethel Born, time 11:07 AM Sudie Grumbling 11/19/19

## 2019-11-19 NOTE — PMR Pre-admission (Signed)
PMR Admission Coordinator Pre-Admission Assessment  Patient: Cindy Smith is an 86 y.o., female MRN: [id number] DOB: [birth year] Height: 5' (152.4 cm) Weight: 59.1 kg              Insurance Information HMO:     PPO:      PCP:      IPA:      80/20: yes     OTHER:  PRIMARY: Medicare A & B      Policy#: [id number]      Subscriber: patient CM Name:       Phone#:      Fax#:  Pre-Cert#:       Employer:  Benefits:  Phone #: verified eligibility online via [employer] on 11/19/19     Name:  Eff. Date: Part A & Part B effective 01/05/91     Deduct: $1,484      Out of Pocket Max: NA      Life Max: NA  CIR: 100% with Medicare approval      SNF: 100% (days 1-20), 80% (days 21-100) Outpatient: 80%     Smith-Pay: 20% Home Health: 100%      Smith-Pay:  DME: 80%     Smith-Pay: 20% Providers: pt's choice SECONDARY: BCBS Supplement      Policy#: [id number]      Phone#: [phone]  Financial Counselor:       Phone#:   The "Data Collection Information Summary" for patients in Inpatient Rehabilitation Facilities with attached "Privacy Act [place] Records" was provided and verbally reviewed with: Patient and Family  Emergency Contact Information Contact Information    Name Relation Home Work Mobile   Burney,Lisa Daughter   [phone]     Current Medical History  Patient Admitting Diagnosis: R basal ganglia infarct  History of Present Illness:  Pt is a 86 y.o. right-handed female with history of hypertension as well as colon cancer with colectomy.  Per chart review patient lives alone.  Independent with assistive device.  1 level home one-step to entry.  She does have family to check on her routinely.  Presented 11/17/2019 with blurred vision and gait disturbance.  CT/MRI showed acute small vessel infarct in the right corona radiata external capsule.  No associated hemorrhage.  CT angiogram of head and neck with no intracranial occlusion or high-grade stenosis.  Echocardiogram pending.   Admission chemistry sodium 133 glucose 108 creatinine 1.10, urinalysis negative nitrite.  Currently maintained on aspirin and Plavix for CVA prophylaxis.  Therapy evaluation completed with recommendations of physical medicine rehab consult.  Complete NIHSS TOTAL: 2 Glasgow Coma Scale Score: 14  Past Medical History  Past Medical History:  Diagnosis Date  . HTN (hypertension)     Family History  family history is not on file.  Prior Rehab/Hospitalizations:  Has the patient had prior rehab or hospitalizations prior to admission? No  Has the patient had major surgery during 100 days prior to admission? No  Current Medications   Current Facility-Administered Medications:  .  acetaminophen (TYLENOL) tablet 650 mg, 650 mg, Oral, Q4H PRN **OR** acetaminophen (TYLENOL) 160 MG/5ML solution 650 mg, 650 mg, Per Tube, Q4H PRN **OR** acetaminophen (TYLENOL) suppository 650 mg, 650 mg, Rectal, Q4H PRN, Pahwani, Rinka R, MD .  amLODipine (NORVASC) tablet 10 mg, 10 mg, Oral, Daily, Pahwani, Ravi, MD, 10 mg at 11/19/19 0809 .  aspirin EC tablet 81 mg, 81 mg, Oral, Daily, Pahwani, Rinka R, MD, 81 mg at 11/19/19 0808 .  atorvastatin (LIPITOR) tablet 40 mg,  40 mg, Oral, Daily, Pahwani, Ravi, MD, 40 mg at 11/19/19 0809 .  [COMPLETED] clopidogrel (PLAVIX) tablet 300 mg, 300 mg, Oral, Once, 300 mg at 11/18/19 0402 **AND** clopidogrel (PLAVIX) tablet 75 mg, 75 mg, Oral, Daily, Greta Doom, MD, 75 mg at 11/19/19 0809 .  hydrALAZINE (APRESOLINE) injection 10 mg, 10 mg, Intravenous, Q4H PRN, Shela Leff, MD, 10 mg at 11/18/19 0402 .  latanoprost (XALATAN) 0.005 % ophthalmic solution 1 drop, 1 drop, Both Eyes, QPM, Shela Leff, MD, 1 drop at 11/18/19 1857  Patients Current Diet:  Diet Order            Diet Heart Room service appropriate? Yes; Fluid consistency: Thin  Diet effective now                 Precautions / Restrictions Precautions Precautions: Fall, Other  (comment) Precaution Comments: chronic L knee injury, watch BP (out of permissive HTN window per MD), incontinent of urine and stool Restrictions Weight Bearing Restrictions: No   Has the patient had 2 or more falls or a fall with injury in the past year?No  Prior Activity Level Household: only leaves house for doctor's appointments  Prior Functional Level Prior Function Level of Independence: Independent with assistive device(s)  Self Care: Did the patient need help bathing, dressing, using the toilet or eating?  Independent  Indoor Mobility: Did the patient need assistance with walking from room to room (with or without device)? Independent  Stairs: Did the patient need assistance with internal or external stairs (with or without device)? Pt reported has no steps that she has to go up or down  Functional Cognition: Did the patient need help planning regular tasks such as shopping or remembering to take medications? Independent  Home Assistive Devices / Equipment Home Equipment: Walker - 4 wheels  Prior Device Use: Indicate devices/aids used by the patient prior to current illness, exacerbation or injury? Rollator walker  Current Functional Level Cognition  Arousal/Alertness: Awake/alert Overall Cognitive Status: Within Functional Limits for tasks assessed Orientation Level: Oriented X4 General Comments: perhaps a bit HOH and with difficulty understanding through masks/face shield but WNL otherwise Attention: Focused, Sustained, Selective Focused Attention: Appears intact Sustained Attention: Appears intact Selective Attention: Appears intact Memory: Appears intact Awareness: Appears intact Problem Solving: Appears intact Executive Function: Organizing, Decision Making, Sequencing, Reasoning Reasoning: Appears intact Sequencing: Appears intact Organizing: Appears intact Decision Making: Appears intact Safety/Judgment: Appears intact    Extremity Assessment (includes  Sensation/Coordination)  Upper Extremity Assessment: Generalized weakness  Lower Extremity Assessment: Defer to PT evaluation RLE Sensation: history of peripheral neuropathy LLE Deficits / Details: knee in valgus and adducted position, clearly favors L LE with mobility LLE Sensation: history of peripheral neuropathy LLE Coordination: decreased gross motor    ADLs  Overall ADL's : Needs assistance/impaired Eating/Feeding: Set up, Sitting Upper Body Dressing : Minimal assistance, Sitting Upper Body Dressing Details (indicate cue type and reason): To don posterior gown seated EOB.  Lower Body Dressing: Moderate assistance, Sitting/lateral leans, Sit to/from stand Toilet Transfer: Moderate assistance Toilet Transfer Details (indicate cue type and reason): Mod A with cues for hand placement.  Toileting- Clothing Manipulation and Hygiene: Moderate assistance Toileting - Clothing Manipulation Details (indicate cue type and reason): Mod A for clothing/hygiene management     Mobility  Overal bed mobility: Needs Assistance Bed Mobility: Supine to Sit Supine to sit: Min assist, HOB elevated General bed mobility comments: increased time and effort, minA for assist with L LE intermittently  Transfers  Overall transfer level: Needs assistance Equipment used: Rolling walker (2 wheeled) Transfers: Sit to/from Stand, [employer] Transfers Sit to Stand: Min assist, Mod assist Stand pivot transfers: Mod assist General transfer comment: fluctuating between Min-ModA dependent on fatigue levels, max cues for hand placement and safety; ModA to pivot over to [facility] while controlling RW and maintaining balance    Ambulation / Gait / Stairs / Wheelchair Mobility  Ambulation/Gait General Gait Details: deferred due to stool incontinence- but able to take small shuffle steps during transfers    Posture / Balance Dynamic Sitting Balance Sitting balance - Comments: min guard at EOB Balance Overall balance  assessment: Needs assistance Sitting-balance support: Bilateral upper extremity supported, Feet supported Sitting balance-Leahy Scale: Fair Sitting balance - Comments: min guard at EOB Standing balance support: Bilateral upper extremity supported, During functional activity Standing balance-Leahy Scale: Poor Standing balance comment: heavy reliance on BUE support, up to MinA dynamically    Special needs/care consideration Designated visitor Riley Churches, daughter, Bladder incontinence     Previous Home Environment (from acute therapy documentation) Living Arrangements: Alone  Lives With: Alone Available Help at Discharge: Family, Available PRN/intermittently Type of Home: [place]: One level Home Access: Stairs to enter, Other (comment) (half step to enter) Entrance Stairs-Rails: None Entrance Stairs-Number of Steps: half step  Bathroom Shower/Tub: Other (comment) (pt does something similar to bed baths, but at the sink) Biochemist, clinical: Standard Bathroom Accessibility: No Home Care Services: No Additional Comments: has had neuropathy for awhile, about 4 yars ago she hurt her knee and never got it evaluated  Discharge Living Setting Plans for Discharge Living Setting: Patient's home Type of Home at Discharge: House Discharge Home Access: Stairs to enter Entrance Stairs-Rails: None Entrance Stairs-Number of Steps: half step Discharge Bathroom Shower/Tub: Other (comment) (pt does something similar to bed baths, but at sink) Discharge Bathroom Toilet: Standard Discharge Bathroom Accessibility: No Does the patient have any problems obtaining your medications?: No  Social/Family/Support Systems Anticipated Caregiver: Riley Churches, daughter Anticipated Caregiver's Contact Information: [phone] Caregiver Availability: Intermittent Discharge Plan Discussed with Primary Caregiver: Yes Is Caregiver In Agreement with Plan?: Yes Does Caregiver/Family have Issues with  Lodging/Transportation while Pt is in Rehab?: No   Goals Patient/Family Goal for Rehab: PT/OT- Mod I Expected length of stay: 10-14 days Pt/Family Agrees to Admission and willing to participate: Yes Program Orientation Provided & Reviewed with Pt/Caregiver Including Roles  & Responsibilities: Yes   Decrease burden of Care through IP rehab admission: NA    Possible need for SNF placement upon discharge:NA   Patient Condition: This patient's condition remains as documented in the consult dated 11/18/19, in which the Rehabilitation Physician determined and documented that the patient's condition is appropriate for intensive rehabilitative care in an inpatient rehabilitation facility. Will admit to inpatient rehab today.  Preadmission Screen Completed By:  Bethel Born, CCC-SLP, 11/19/2019 11:07 AM ______________________________________________________________________   Discussed status with Dr. Ranell Patrick on 11/19/19 11:07 AM at 11:07 AM and received approval for admission today.  Admission Coordinator:  Bethel Born, time 11:07 AM Sudie Grumbling 11/19/19

## 2019-11-19 NOTE — Progress Notes (Signed)
Patient ID: Cindy Smith, female   DOB: May 02, 1923, 86 y.o.   MRN: [id number]  SW spoke with pt dtr Lattie Haw [phone]) to introduce self, explain role, and discuss discharge process. Reports she is pt POA and will be primary contact. States pt has a "village" of support in which her brother Jenny Reichmann, brother in Sports coach and granddaughter live in town and check in in on her. As well as neighbors. State pt has someone that comes into the home to do housekeeping and has someone managing the yard work. Reports pt has always been independent, and prepares simple meals. Concerns about balance prior to this incident were concerning. Goal is for mother to be able to return home independently as she was prior to incident. Pt DME: rollator, and powerchair. SW informed there will be follow-up after team conference on Tuesday.   Loralee Pacas, MSW, [place] Office: [phone] Cell: [phone] Fax: [phone]

## 2019-11-19 NOTE — Progress Notes (Signed)
STROKE TEAM PROGRESS NOTE   INTERVAL HISTORY Her daughter is at the bedside.   She is doing well and has no complaints. Neuro exam is unchanged.Echo normal.   Vitals:   11/19/19 0010 11/19/19 0425 11/19/19 0751 11/19/19 0810  BP: (!) 149/61 (!) 176/58  (!) 175/68  Pulse: (!) 57 62    Resp: 16 17  20   Temp: 98.9 F (37.2 C) 98.2 F (36.8 C) 98 F (36.7 C)   TempSrc: Axillary Oral Oral   SpO2: 100% 95%    Weight:      Height:       CBC:  Recent Labs  Lab 11/17/19 1341 11/18/19 0007  WBC 6.2 6.5  HGB 11.3* 11.1*  HCT 35.8* 34.3*  MCV 94.2 95.3  PLT 234 784   Basic Metabolic Panel:  Recent Labs  Lab 11/17/19 1341 11/18/19 0440  NA 133* 135  K 4.4 3.9  CL 100 100  CO2 23 21*  GLUCOSE 108* 88  BUN 22 21  CREATININE 1.10* 1.03*  CALCIUM 9.8 9.8  MG  --  2.2   Lipid Panel:  Recent Labs  Lab 11/18/19 0440  CHOL 212*  TRIG 90  HDL 62  CHOLHDL 3.4  VLDL 18  LDLCALC 132*   HgbA1c:  Recent Labs  Lab 11/18/19 0007  HGBA1C 5.4   Urine Drug Screen: No results for input(s): LABOPIA, COCAINSCRNUR, LABBENZ, AMPHETMU, THCU, LABBARB in the last 168 hours.  Alcohol Level No results for input(s): ETH in the last 168 hours.  IMAGING past 24 hours ECHOCARDIOGRAM COMPLETE  Result Date: 11/18/2019    ECHOCARDIOGRAM REPORT   Patient Name:   Cindy Smith Date of Exam: 11/18/2019 Medical Rec #:  [id number]            Height:       60.0 in Accession #:    [id number]           Weight:       130.3 lb Date of Birth:  1923-09-28           BSA:          1.556 m Patient Age:    86 years             BP:           163/71 mmHg Patient Gender: F                    HR:           68 bpm. Exam Location:  Inpatient Procedure: 2D Echo, Cardiac Doppler and Color Doppler Indications:    Stroke 434.91 / I163.9  History:        Patient has no prior history of Echocardiogram examinations.                 Risk Factors:Hypertension and Non-Smoker.  Sonographer:    Vickie Epley RDCS Referring  Phys: [id number] [place]  1. Left ventricular ejection fraction, by estimation, is 65 to 70%. The left ventricle has normal function. The left ventricle has no regional wall motion abnormalities. Left ventricular diastolic parameters are consistent with Grade II diastolic dysfunction (pseudonormalization). Elevated left atrial pressure.  2. Right ventricular systolic function is normal. The right ventricular size is normal.  3. Left atrial size was mild to moderately dilated.  4. Right atrial size was mildly dilated.  5. The mitral valve is normal in structure. Mild mitral valve regurgitation. No evidence of mitral  stenosis. Moderate mitral annular calcification.  6. The aortic valve is normal in structure. There is moderate calcification of the aortic valve. There is moderate thickening of the aortic valve. Aortic valve regurgitation is mild. Mild aortic valve stenosis. Aortic valve mean gradient measures 10.0 mmHg.  7. The inferior vena cava is normal in size with greater than 50% respiratory variability, suggesting right atrial pressure of 3 mmHg. Conclusion(s)/Recommendation(s): No intracardiac source of embolism detected on this transthoracic study. A transesophageal echocardiogram is recommended to exclude cardiac source of embolism if clinically indicated. FINDINGS  Left Ventricle: Left ventricular ejection fraction, by estimation, is 65 to 70%. The left ventricle has normal function. The left ventricle has no regional wall motion abnormalities. The left ventricular internal cavity size was normal in size. There is  no left ventricular hypertrophy. Left ventricular diastolic parameters are consistent with Grade II diastolic dysfunction (pseudonormalization). Elevated left atrial pressure. Right Ventricle: The right ventricular size is normal. No increase in right ventricular wall thickness. Right ventricular systolic function is normal. Left Atrium: Left atrial size was mild to moderately  dilated. Right Atrium: Right atrial size was mildly dilated. Pericardium: Trivial pericardial effusion is present. Mitral Valve: The mitral valve is normal in structure. There is moderate thickening of the mitral valve leaflet(s). There is moderate calcification of the mitral valve leaflet(s). Moderate mitral annular calcification. Mild mitral valve regurgitation. No  evidence of mitral valve stenosis. Tricuspid Valve: The tricuspid valve is normal in structure. Tricuspid valve regurgitation is mild . No evidence of tricuspid stenosis. Aortic Valve: The aortic valve is normal in structure. There is moderate calcification of the aortic valve. There is moderate thickening of the aortic valve. Aortic valve regurgitation is mild. Mild aortic stenosis is present. Aortic valve mean gradient measures 10.0 mmHg. Pulmonic Valve: The pulmonic valve was normal in structure. Pulmonic valve regurgitation is not visualized. No evidence of pulmonic stenosis. Aorta: The aortic root is normal in size and structure. Venous: The inferior vena cava is normal in size with greater than 50% respiratory variability, suggesting right atrial pressure of 3 mmHg. IAS/Shunts: No atrial level shunt detected by color flow Doppler.  LEFT VENTRICLE PLAX 2D LVIDd:         5.10 cm      Diastology LVIDs:         3.60 cm      LV e' medial:    3.50 cm/s LV PW:         0.90 cm      LV E/e' medial:  28.1 LV IVS:        0.90 cm      LV e' lateral:   6.00 cm/s LVOT diam:     2.10 cm      LV E/e' lateral: 16.4 LV SV:         69 LV SV Index:   45 LVOT Area:     3.46 cm  LV Volumes (MOD) LV vol d, MOD A2C: 102.0 ml LV vol d, MOD A4C: 87.6 ml LV vol s, MOD A2C: 47.1 ml LV vol s, MOD A4C: 46.4 ml LV SV MOD A2C:     54.9 ml LV SV MOD A4C:     87.6 ml LV SV MOD BP:      51.1 ml RIGHT VENTRICLE RV S prime:     10.70 cm/s TAPSE (M-mode): 1.4 cm LEFT ATRIUM             Index  RIGHT ATRIUM           Index LA diam:        4.50 cm 2.89 cm/m  RA Area:     11.30  cm LA Vol (A2C):   59.4 ml 38.18 ml/m RA Volume:   24.60 ml  15.81 ml/m LA Vol (A4C):   40.9 ml 26.29 ml/m LA Biplane Vol: 49.8 ml 32.01 ml/m  AORTIC VALVE AV Mean Grad: 10.0 mmHg LVOT Vmax:    94.60 cm/s LVOT Vmean:   63.500 cm/s LVOT VTI:     0.200 m  AORTA Ao Root diam: 3.00 cm MITRAL VALVE MV Area (PHT): 3.85 cm    SHUNTS MV Decel Time: 197 msec    Systemic VTI:  0.20 m MV E velocity: 98.50 cm/s  Systemic Diam: 2.10 cm MV A velocity: 71.60 cm/s MV E/A ratio:  1.38 Ena Dawley MD Electronically signed by Ena Dawley MD Signature Date/Time: 11/18/2019/1:52:00 PM    Final     PHYSICAL EXAM   Pleasant elderly Caucasian lady not in distress. . Afebrile. Head is nontraumatic. Neck is supple without bruit.    Cardiac exam no murmur or gallop. Lungs are clear to auscultation. Distal pulses are well felt. Neurological Exam ;  Awake  Alert oriented x 3. Normal speech and language.eye movements full without nystagmus.fundi were not visualized. Vision acuity and fields appear normal. Hearing is normal. Palatal movements are normal. Face symmetric. Tongue midline. Normal strength, tone, reflexes and coordination except mild weakness of left greater than right leg.. Normal sensation. Gait deferred. ASSESSMENT/PLAN Ms. AVIRA TILLISON is a 86 y.o. female with history of HTN presenting with blurred vision since Friday.   Stroke:   R basal ganglia infarct, larger than a typical small vessel disease infarct, possible embolic source  CT head No acute abnormality. Small vessel disease. Atrophy.   MRI  2 cm R corona radiata infarct. Small vessel disease.   MR CS  Mild spondylolisthesis C2-3, C7-T1, widespread CS degeneration, no cord signal abnormality. Widespread moderate and severe degnerative foraminal stenosis   CTA head & neck proximal R ICA 50% stenosis. R thyroid lobe heterogeneous and enlarged, [place] recommended  2D Echo EF 65-70%. No source of embolus   Given age 56, OP 30d monitor to  look for AF, consider loop if 30 d neg     LDL 132  HgbA1c 5.4  VTE prophylaxis - SCDs   No antithrombotic prior to admission, now on aspirin 81 mg daily and clopidogrel 75 mg daily following plavix load.  Treat w/ DAPT x 3 weeks then aspirin alone  Therapy recommendations:  CIR  Disposition:  CIR - independent and lives alone PTA  Ok for d/c from stroke standpoint.  Follow-up Stroke Clinic at [facility] in 4 weeks. Office will call with appointment date and time. Order placed.  Hypertensive Urgency  BP as high as 235/80  Stable w/ SBP on the high side at times . BP goal set as < 180 my medical hospitalist, prns available . Long-term BP goal normotensive  Hyperlipidemia  Home meds:  No statin  Now on lipitor 40  LDL 132, goal < 70  Continue statin at discharge  Other Stroke Risk Factors  Advanced age  Other Active Problems  AKI, dehydrated   normocytic anemia  Hyponatremia   Hx colon cancer s/p colecteomy  B LE neuropathy    Asymptomatic bacteriuria   R thyroid lobe heterogeneous and enlarged, [place] recommended  Hospital day #  2 Continue aspirin and Plavix for 3 weeks followed by aspirin alone and aggressive risk factor modification.  Stroke team will sign off.  Kindly call for questions.  Discussed with Dr. Oneta Rack, MD  To contact Stroke Continuity provider, please refer to [email or web address] After hours, contact General Neurology

## 2019-11-19 NOTE — Progress Notes (Signed)
Inpatient Rehab Admissions Coordinator:  There is a bed availble for pt to admit to CIR today. Dr. Doristine Bosworth in agreement.  NSG, TOC, pt, and family made aware.   Gayland Curry, [place], [place] Admissions Coordinator [phone]

## 2019-11-19 NOTE — Discharge Instructions (Signed)
 Hospital Discharge After a Stroke  Being discharged from the hospital after a stroke can feel overwhelming. Many things may be different, and it is normal to feel scared or anxious. Some stroke survivors may be able to return to their homes, and others may need more specialized care on a temporary or permanent basis. Your stroke care team will work with you to develop a discharge plan that is best for you. Ask questions if you do not understand something. Invite a friend or family member to participate in discharge planning. Understanding and following your discharge plan can help to prevent another stroke or other problems. Understanding your medicines After a stroke, your health care provider may prescribe one or more types of medicine. It is important to take medicines exactly as told by your health care provider. Serious harm, such as another stroke, can happen if you are unable to take your medicine exactly as prescribed. Make sure you understand:  What medicine to take.  Why you are taking the medicine.  How and when to take it.  If it can be taken with your other medicines and herbal supplements.  Possible side effects.  When to call your health care provider if you have any side effects.  How you will get and pay for your medicines. Medical assistance programs may be able to help you pay for prescription medicines if you cannot afford them. If you are taking an anticoagulant, be sure to take it exactly as told by your health care provider. This type of medicine can increase the risk of bleeding because it works to prevent blood from clotting. You may need to take certain precautions to prevent bleeding. You should contact your health care provider if you have:  Bleeding or bruising.  A fall or other injury to your head.  Blood in your urine or stool (feces). Planning for home safety  Take steps to prevent falls, such as installing grab bars or using a shower chair. Ask a  friend or family member to get needed things in place before you go home if possible. A therapist can come to your home to make recommendations for safety equipment. Ask your health care provider if you would benefit from this service or from home care. Getting needed equipment Ask your health care provider for a list of any medical equipment and supplies you will need at home. These may include items such as:  Walkers.  Canes.  Wheelchairs.  Hand-strengthening devices.  Special eating utensils. Medical equipment can be rented or purchased, depending on your insurance coverage. Check with your insurance company about what is covered. Keeping follow-up visits After a stroke, you will need to follow up regularly with a health care provider. You may also need rehabilitation, which can include physical therapy, occupational therapy, or speech-language therapy. Keeping these appointments is very important to your recovery after a stroke. Be sure to bring your medicine list and discharge papers with you to your appointments. If you need help to keep track of your schedule, use a calendar or appointment reminder. Preventing another stroke Having a stroke puts you at risk for another stroke in the future. Ask your health care provider what actions you can take to lower the risk. These may include:  Increasing how much you exercise.  Making a healthy eating plan.  Quitting smoking.  Managing other health conditions, such as high blood pressure, high cholesterol, or diabetes.  Limiting alcohol use. Knowing the warning signs of a stroke  Make   sure you understand the signs of a stroke. Before you leave the hospital, you will receive information outlining the stroke warning signs. Share these with your friends and family members. "BE FAST" is an easy way to remember the main warning signs of a stroke:  B - Balance. Signs are dizziness, sudden trouble walking, or loss of balance.  E - Eyes.  Signs are trouble seeing or a sudden change in vision.  F - Face. Signs are sudden weakness or numbness of the face, or the face or eyelid drooping on one side.  A - Arms. Signs are weakness or numbness in an arm. This happens suddenly and usually on one side of the body.  S - Speech. Signs are sudden trouble speaking, slurred speech, or trouble understanding what people say.  T - Time. Time to call emergency services. Write down what time symptoms started. Other signs of stroke may include:  A sudden, severe headache with no known cause.  Nausea or vomiting.  Seizure. These symptoms may represent a serious problem that is an emergency. Do not wait to see if the symptoms will go away. Get medical help right away. Call your local emergency services (911 in the [place] Do not drive yourself to the hospital. Make note of the time that you had your first symptoms. Your emergency responders or emergency room staff will need to know this information. Summary  Being discharged from the hospital after a stroke can feel overwhelming. It is normal to feel scared or anxious.  Make sure you take medicines exactly as told by your health care provider.  Know the warning signs of a stroke, and get help right way if you have any of these symptoms. "BE FAST" is an easy way to remember the main warning signs of a stroke. This information is not intended to replace advice given to you by your health care provider. Make sure you discuss any questions you have with your health care provider. Document Revised: 10/14/2018 Document Reviewed: 04/26/2016 [employer] Patient Education  2020 [employer]  

## 2019-11-19 NOTE — Progress Notes (Signed)
Signed    Expand All Collapse All       Physical Medicine and Rehabilitation Admission H&P       HPI: Cindy Smith is a 86 year old right-handed female with history of hypertension as well as colon cancer with colectomy.  Per chart review lives alone independent with assistive device.  1 level home one-step to entry.  She does have family checks on her routinely.  Presented 11/17/2019 with blurred vision and gait disturbance.  CT/MRI showed acute small vessel infarct in the right corona radiata external capsule.  No associated hemorrhage.  CT angiogram of the head and neck with no intracranial occlusion or high-grade stenosis.  Incidental finding of thyroid nodule.  Echocardiogram with ejection fraction of 65 to 70% no wall motion abnormalities grade 2 diastolic dysfunction.  Admission chemistry sodium 133 glucose 108 creatinine 1.10 urinalysis negative nitrite.  Currently maintained on aspirin and Plavix for CVA prophylaxis.  Tolerating a regular diet.  Therapy evaluations completed and patient was admitted for a comprehensive rehab program.   Review of Systems  Constitutional: Negative for chills and fever.  HENT: Positive for hearing loss.   Eyes: Positive for blurred vision.  Respiratory: Negative for cough and shortness of breath.   Cardiovascular: Negative for chest pain, palpitations and leg swelling.  Gastrointestinal: Positive for constipation. Negative for heartburn, nausea and vomiting.  Genitourinary: Negative for dysuria, flank pain and hematuria.  Musculoskeletal: Positive for joint pain and myalgias.  Skin: Negative for rash.  Neurological: Positive for weakness.  All other systems reviewed and are negative.       Past Medical History:  Diagnosis Date  . HTN (hypertension)           Past Surgical History:  Procedure Laterality Date  . COLECTOMY        History reviewed. No pertinent family history. Social History:  reports that she has never smoked. She has  never used smokeless tobacco. She reports that she does not drink alcohol and does not use drugs. Allergies:  Allergies  Allergen Reactions  . Codeine        "I just pass out"  . Other Other (See Comments)      Steroid (Lotemax eye drops)          Medications Prior to Admission  Medication Sig Dispense Refill  . atenolol (TENORMIN) 25 MG tablet Take 25 mg by mouth daily.      . cyanocobalamin 1000 MCG tablet Take 1,000 mcg by mouth daily.      Marland Kitchen latanoprost (XALATAN) 0.005 % ophthalmic solution Place 1 drop into both eyes every evening.      Marland Kitchen VITAMIN D, CHOLECALCIFEROL, PO Take 1 tablet by mouth daily.          Drug Regimen Review Drug regimen was reviewed and remains appropriate with no significant issues identified   Home: Home Living Family/patient expects to be discharged to:: Private residence Living Arrangements: Alone Available Help at Discharge: Family, Available PRN/intermittently, Neighbor, Other (Comment) (grandchildren) Type of Home: House Home Access: Stairs to enter [employer] of Steps: 1 with no rail Entrance Stairs-Rails: None Home Layout: One level Bathroom Shower/Tub: Other (comment) (takes birdbaths at the sink) Home Equipment: Walker - 4 wheels Additional Comments: has had neuropathy for awhile, about 4 yars ago she hurt her knee and never got it evaluated  Lives With: Alone   Functional History: Prior Function Level of Independence: Independent with assistive device(s)   Functional Status:  Mobility: Bed Mobility Overal bed  mobility: Needs Assistance Bed Mobility: Supine to Sit Supine to sit: Min assist, HOB elevated General bed mobility comments: increased time and effort, minA for assist with L LE intermittently Transfers Overall transfer level: Needs assistance Equipment used: Rolling walker (2 wheeled) Transfers: Sit to/from Stand, [employer] Transfers Sit to Stand: Min assist, Mod assist Stand pivot transfers: Mod  assist General transfer comment: fluctuating between Min-ModA dependent on fatigue levels, max cues for hand placement and safety; ModA to pivot over to [facility] while controlling RW and maintaining balance Ambulation/Gait General Gait Details: deferred due to stool incontinence- but able to take small shuffle steps during transfers   ADL: ADL Overall ADL's : Needs assistance/impaired Eating/Feeding: Set up, Sitting Upper Body Dressing : Minimal assistance, Sitting Upper Body Dressing Details (indicate cue type and reason): To don posterior gown seated EOB.  Lower Body Dressing: Moderate assistance, Sitting/lateral leans, Sit to/from stand Toilet Transfer: Moderate assistance Toilet Transfer Details (indicate cue type and reason): (P) Mod A with cues for hand placement.  Toileting- Clothing Manipulation and Hygiene: (P) Moderate assistance Toileting - Clothing Manipulation Details (indicate cue type and reason): (P) Mod A for clothing/hygiene management    Cognition: Cognition Overall Cognitive Status: Within Functional Limits for tasks assessed Arousal/Alertness: Awake/alert Orientation Level: Oriented X4 Attention: Focused, Sustained, Selective Focused Attention: Appears intact Sustained Attention: Appears intact Selective Attention: Appears intact Memory: Appears intact Awareness: Appears intact Problem Solving: Appears intact Executive Function: Organizing, Decision Making, Sequencing, Reasoning Reasoning: Appears intact Sequencing: Appears intact Organizing: Appears intact Decision Making: Appears intact Safety/Judgment: Appears intact Cognition Arousal/Alertness: Awake/alert Behavior During Therapy: WFL for tasks assessed/performed Overall Cognitive Status: Within Functional Limits for tasks assessed General Comments: perhaps a bit HOH and with difficulty understanding through masks/face shield but WNL otherwise   Physical Exam: Blood pressure (!) 163/71, pulse 73,  temperature 98.1 F (36.7 C), temperature source Axillary, resp. rate 18, height 5' (1.524 m), weight 59.1 kg, SpO2 96 %. General: Alert and oriented x 3, No apparent distress HEENT: Head is normocephalic, atraumatic, PERRLA, EOMI, sclera anicteric, oral mucosa pink and moist, dentition intact, ext ear canals clear,  Neck: Supple without JVD or lymphadenopathy Heart: Reg rate and rhythm. No murmurs rubs or gallops Chest: CTA bilaterally without wheezes, rales, or rhonchi; no distress Abdomen: Soft, non-tender, non-distended, bowel sounds positive. Extremities: No clubbing, cyanosis, or edema. Pulses are 2+ Skin: Clean and intact without signs of breakdown Neuro: Patient is alert in no acute distress.  Makes eye contact with examiner and follows commands.  Oriented x3.  Fair awareness of deficits. 5/5 strength throughout except 4/5 LLE.  Psych: Pt's affect is appropriate. Pt is cooperative    Lab Results Last 48 Hours        Results for orders placed or performed during the hospital encounter of 11/17/19 (from the past 48 hour(s))  Basic metabolic panel     Status: Abnormal    Collection Time: 11/17/19  1:41 PM  Result Value Ref Range    Sodium 133 (L) 135 - 145 mmol/L    Potassium 4.4 3.5 - 5.1 mmol/L    Chloride 100 98 - 111 mmol/L    CO2 23 22 - 32 mmol/L    Glucose, Bld 108 (H) 70 - 99 mg/dL      Comment: Glucose reference range applies only to samples taken after fasting for at least 8 hours.    BUN 22 8 - 23 mg/dL    Creatinine, Ser 1.10 (H) 0.44 - 1.00  mg/dL    Calcium 9.8 8.9 - 10.3 mg/dL    GFR, Estimated 43 (L) >60 mL/min    Anion gap 10 5 - 15      Comment: Performed at [facility] [address] [place], [place] [address]  CBC     Status: Abnormal    Collection Time: 11/17/19  1:41 PM  Result Value Ref Range    WBC 6.2 4.0 - 10.5 K/uL    RBC 3.80 (L) 3.87 - 5.11 MIL/uL    Hemoglobin 11.3 (L) 12.0 - 15.0 g/dL    HCT 35.8 (L) 36 - 46 %    MCV 94.2 80.0 - 100.0  fL    MCH 29.7 26.0 - 34.0 pg    MCHC 31.6 30.0 - 36.0 g/dL    RDW 13.6 11.5 - 15.5 %    Platelets 234 150 - 400 K/uL    nRBC 0.0 0.0 - 0.2 %      Comment: Performed at [facility], [place] [address], [place], [place] [address]  Respiratory Panel by RT PCR (Flu A&B, Covid) - Nasopharyngeal Swab     Status: None    Collection Time: 11/17/19  5:32 PM    Specimen: Nasopharyngeal Swab  Result Value Ref Range    SARS Coronavirus 2 by RT PCR NEGATIVE NEGATIVE      Comment: (NOTE) SARS-CoV-2 target nucleic acids are NOT DETECTED.   The SARS-CoV-2 RNA is generally detectable in upper respiratoy specimens during the acute phase of infection. The lowest concentration of SARS-CoV-2 viral copies this assay can detect is 131 copies/mL. A negative result does not preclude SARS-Cov-2 infection and should not be used as the sole basis for treatment or other patient management decisions. A negative result may occur with  improper specimen collection/handling, submission of specimen other than nasopharyngeal swab, presence of viral mutation(s) within the areas targeted by this assay, and inadequate number of viral copies (<131 copies/mL). A negative result must be combined with clinical observations, patient history, and epidemiological information. The expected result is Negative.   Fact Sheet for Patients:  PinkCheek.be   Fact Sheet for Healthcare Providers:  GravelBags.it   This test is no t yet approved or cleared by the [place] FDA and  has been authorized for detection and/or diagnosis of SARS-CoV-2 by FDA under an Emergency Use Authorization (EUA). This EUA will remain  in effect (meaning this test can be used) for the duration of the COVID-19 declaration under Section 564(b)(1) of the Act, 21 U.S.C. section 360bbb-3(b)(1), unless the authorization is terminated or revoked sooner.        Influenza A by PCR NEGATIVE  NEGATIVE    Influenza B by PCR NEGATIVE NEGATIVE      Comment: (NOTE) The Xpert Xpress SARS-CoV-2/FLU/RSV assay is intended as an aid in  the diagnosis of influenza from Nasopharyngeal swab specimens and  should not be used as a sole basis for treatment. Nasal washings and  aspirates are unacceptable for Xpert Xpress SARS-CoV-2/FLU/RSV  testing.   Fact Sheet for Patients: PinkCheek.be   Fact Sheet for Healthcare Providers: GravelBags.it   This test is not yet approved or cleared by the [place] FDA and  has been authorized for detection and/or diagnosis of SARS-CoV-2 by  FDA under an Emergency Use Authorization (EUA). This EUA will remain  in effect (meaning this test can be used) for the duration of the  Covid-19 declaration under Section 564(b)(1) of the Act, 21  U.S.C. section 360bbb-3(b)(1), unless the authorization is  terminated or revoked. Performed at [facility], [place] [address] [place], [place] [address]    Urinalysis, Routine w reflex microscopic Urine, Clean Catch     Status: Abnormal    Collection Time: 11/17/19  5:44 PM  Result Value Ref Range    Color, Urine YELLOW YELLOW    APPearance HAZY (A) CLEAR    Specific Gravity, Urine 1.008 1.005 - 1.030    pH 6.0 5.0 - 8.0    Glucose, UA NEGATIVE NEGATIVE mg/dL    Hgb urine dipstick NEGATIVE NEGATIVE    Bilirubin Urine NEGATIVE NEGATIVE    Ketones, ur NEGATIVE NEGATIVE mg/dL    Protein, ur 30 (A) NEGATIVE mg/dL    Nitrite NEGATIVE NEGATIVE    Leukocytes,Ua LARGE (A) NEGATIVE    RBC / HPF 0-5 0 - 5 RBC/hpf    WBC, UA 21-50 0 - 5 WBC/hpf    Bacteria, UA FEW (A) NONE SEEN    Squamous Epithelial / LPF 0-5 0 - 5      Comment: Performed at [facility], [address], [place], [place] [address]  CBC     Status: Abnormal    Collection Time: 11/18/19 12:07 AM  Result Value Ref Range    WBC 6.5 4.0 - 10.5 K/uL    RBC 3.60 (L) 3.87 - 5.11 MIL/uL     Hemoglobin 11.1 (L) 12.0 - 15.0 g/dL    HCT 34.3 (L) 36 - 46 %    MCV 95.3 80.0 - 100.0 fL    MCH 30.8 26.0 - 34.0 pg    MCHC 32.4 30.0 - 36.0 g/dL    RDW 13.6 11.5 - 15.5 %    Platelets 223 150 - 400 K/uL    nRBC 0.0 0.0 - 0.2 %      Comment: Performed at [facility], [place] [address] [place], [place] [address]  Lipid panel     Status: Abnormal    Collection Time: 11/18/19  4:40 AM  Result Value Ref Range    Cholesterol 212 (H) 0 - 200 mg/dL    Triglycerides 90 <150 mg/dL    HDL 62 >40 mg/dL    Total CHOL/HDL Ratio 3.4 RATIO    VLDL 18 0 - 40 mg/dL    LDL Cholesterol 132 (H) 0 - 99 mg/dL      Comment:        Total Cholesterol/HDL:CHD Risk Coronary Heart Disease Risk Table                     Men   Women  1/2 Average Risk   3.4   3.3  Average Risk       5.0   4.4  2 X Average Risk   9.6   7.1  3 X Average Risk  23.4   11.0        Use the calculated Patient Ratio above and the CHD Risk Table to determine the patient's CHD Risk.        ATP III CLASSIFICATION (LDL):  <100     mg/dL   Optimal  100-129  mg/dL   Near or Above                    Optimal  130-159  mg/dL   Borderline  160-189  mg/dL   High  >190     mg/dL   Very High Performed at [facility]  [facility], [place] [address] [place], [place] [address]    Basic metabolic panel     Status: Abnormal    Collection Time: 11/18/19  4:40 AM  Result Value Ref Range    Sodium 135 135 - 145 mmol/L    Potassium 3.9 3.5 - 5.1 mmol/L    Chloride 100 98 - 111 mmol/L    CO2 21 (L) 22 - 32 mmol/L    Glucose, Bld 88 70 - 99 mg/dL      Comment: Glucose reference range applies only to samples taken after fasting for at least 8 hours.    BUN 21 8 - 23 mg/dL    Creatinine, Ser 1.03 (H) 0.44 - 1.00 mg/dL    Calcium 9.8 8.9 - 10.3 mg/dL    GFR, Estimated 47 (L) >60 mL/min    Anion gap 14 5 - 15      Comment: Performed at [facility] [address] [place], [place] [address]  Magnesium     Status: None    Collection  Time: 11/18/19  4:40 AM  Result Value Ref Range    Magnesium 2.2 1.7 - 2.4 mg/dL      Comment: Performed at [facility] [address] [place],  [address]       Imaging Results (Last 48 hours)  CT HEAD WO CONTRAST   Result Date: 11/17/2019 CLINICAL DATA:  Acute neuro deficit.  Rule out stroke.  Weakness. EXAM: CT HEAD WITHOUT CONTRAST TECHNIQUE: Contiguous axial images were obtained from the base of the skull through the vertex without intravenous contrast. COMPARISON:  None. FINDINGS: Brain: Mild atrophy. Extensive white matter hypodensity diffusely bilaterally. Negative for acute cortical infarct. Negative for hemorrhage or mass. Vascular: Negative for hyperdense vessel Skull: Negative Sinuses/Orbits: Air-fluid level sphenoid sinus. Remaining sinuses clear. Mastoid clear bilaterally. Bilateral cataract extraction. Other: Multiple skin lesions in the scalp. Calcified dermal lesion right parietal scalp most likely Pilar cyst. IMPRESSION: Atrophy with diffuse white matter disease most likely chronic microvascular ischemia No acute abnormality. Electronically Signed   By: Franchot Gallo M.D.   On: 11/17/2019 14:52    MR BRAIN WO CONTRAST   Result Date: 11/17/2019 CLINICAL DATA:  86 year old female with acute weakness, imbalance. EXAM: MRI HEAD WITHOUT CONTRAST TECHNIQUE: Multiplanar, multiecho pulse sequences of the brain and surrounding structures were obtained without intravenous contrast. COMPARISON:  Head CT earlier today. FINDINGS: Brain: Linear, patchy restricted diffusion in the right corona radiata tracking toward the external capsule (series 5, image 75 and series 7, image 62). No other restricted diffusion. Underlying Patchy and confluent bilateral cerebral white matter T2 and FLAIR hyperintensity. Similar signal heterogeneity throughout the bilateral basal ganglia, worse on the right. Occasional chronic micro hemorrhages (left periatrial white matter series 14, image 29). No  evidence of acute hemorrhage. No mass effect. No midline shift, evidence of mass lesion, ventriculomegaly, extra-axial collection. Cervicomedullary junction and pituitary are within normal limits. Mild for age signal heterogeneity in the pons. No cortical encephalomalacia identified. Vascular: Major intracranial vascular flow voids are preserved. Skull and upper cervical spine: Cervical spine detailed separately today. Normal visible bone marrow signal. Sinuses/Orbits: Negative, postoperative changes to both globes. Other: Mastoids are well pneumatized. Visible internal auditory structures appear normal. Scalp and face appear negative. IMPRESSION: 1. Acute small vessel infarct in the right corona radiata, external capsule. No associated hemorrhage or mass effect. 2. Underlying moderate to advanced chronic small vessel disease. Electronically Signed   By: Lemmie Evens  [place] Crane M.D.   On: 11/17/2019 18:44    MR Cervical Spine Wo Contrast   Result Date: 11/17/2019 CLINICAL DATA:  86 year old female with acute weakness, imbalance. EXAM: MRI CERVICAL SPINE WITHOUT CONTRAST TECHNIQUE: Multiplanar, multisequence MR imaging of the cervical spine was performed. No intravenous contrast was administered. COMPARISON:  Brain MRI today reported separately. FINDINGS: Alignment: Mild degenerative anterolisthesis of C2 on C3, C7 on T1. Overall straightening of cervical lordosis. Vertebrae: Widespread degenerative endplate marrow signal changes in the cervical spine. No marrow edema or evidence of acute osseous abnormality. Benign vertebral body hemangioma at T2 on the right. Cord: No cervical spinal cord signal abnormality despite multilevel degenerative stenosis with some cord mass effect detailed below. Visible upper thoracic cord appears normal. Posterior Fossa, vertebral arteries, paraspinal tissues: Cervicomedullary junction is within normal limits. Brain is detailed separately today. Preserved major vascular flow voids in the neck.  Thyromegaly (series 7, image 6 on the right, but no follow-up indicated in this age group (ref: J Am Coll Radiol. 2015 Feb;12(2): 143-50). Disc levels: C2-C3: Anterolisthesis associated with moderate left and severe right facet hypertrophy. Ligament flavum hypertrophy. No spinal stenosis. Moderate to severe bilateral C3 foraminal stenosis greater on the right. C3-C4: Severe disc space loss. Circumferential disc osteophyte complex. Mild to moderate facet and ligament flavum hypertrophy. Mild spinal stenosis. Mild if any cord mass effect. Severe bilateral C4 foraminal stenosis. C4-C5: Severe disc space loss. Circumferential disc osteophyte complex. Mild to moderate posterior element hypertrophy. Mild spinal stenosis and spinal cord mass effect. Severe bilateral C5 foraminal stenosis. C5-C6: Disc space loss with circumferential disc osteophyte complex. Mild to moderate posterior element hypertrophy. No significant spinal stenosis. Moderate left and moderate to severe right C6 foraminal stenosis. C6-C7: Mild disc space loss. Circumferential disc osteophyte complex. Moderate facet and ligament flavum hypertrophy. No significant spinal stenosis. Moderate bilateral C7 foraminal stenosis. C7-T1: Mild anterolisthesis. Mild to moderate posterior element hypertrophy. No significant stenosis. IMPRESSION: 1. No acute osseous abnormality in the cervical spine. Mild degenerative spondylolisthesis at C2-C3 and C7-T1 with facet arthropathy. 2. Widespread cervical spine degeneration. Mild spinal stenosis at C3-C4 and C4-C5 with up to mild spinal cord mass effect, but no cord signal abnormality. 3. Widespread moderate and severe degenerative neural foraminal stenosis, only sparing the C8 nerve level. Electronically Signed   By: Genevie Ann M.D.   On: 11/17/2019 18:56    ECHOCARDIOGRAM COMPLETE   Result Date: 11/18/2019    ECHOCARDIOGRAM REPORT   Patient Name:   JELESA MANGINI Date of Exam: 11/18/2019 Medical Rec #:  [id number]             Height:       60.0 in Accession #:    [id number]           Weight:       130.3 lb Date of Birth:  1923/11/09           BSA:          1.556 m Patient Age:    59 years             BP:           163/71 mmHg Patient Gender: F                    HR:           68 bpm. Exam Location:  Inpatient Procedure: 2D Echo, Cardiac Doppler and Color Doppler Indications:    Stroke 434.91 / Q945.9  History:        Patient has no prior history of Echocardiogram examinations.                 Risk Factors:Hypertension and Non-Smoker.  Sonographer:    Vickie Epley RDCS Referring Phys: [id number] [place]  1. Left ventricular ejection fraction, by estimation, is 65 to 70%. The left ventricle has normal function. The left ventricle has no regional wall motion abnormalities. Left ventricular diastolic parameters are consistent with Grade II diastolic dysfunction (pseudonormalization). Elevated left atrial pressure.  2. Right ventricular systolic function is normal. The right ventricular size is normal.  3. Left atrial size was mild to moderately dilated.  4. Right atrial size was mildly dilated.  5. The mitral valve is normal in structure. Mild mitral valve regurgitation. No evidence of mitral stenosis. Moderate mitral annular calcification.  6. The aortic valve is normal in structure. There is moderate calcification of the aortic valve. There is moderate thickening of the aortic valve. Aortic valve regurgitation is mild. Mild aortic valve stenosis. Aortic valve mean gradient measures 10.0 mmHg.  7. The inferior vena cava is normal in size with greater than 50% respiratory variability, suggesting right atrial pressure of 3 mmHg. Conclusion(s)/Recommendation(s): No intracardiac source of embolism detected on this transthoracic study. A transesophageal echocardiogram is recommended to exclude cardiac source of embolism if clinically indicated. FINDINGS  Left Ventricle: Left ventricular ejection fraction, by estimation,  is 65 to 70%. The left ventricle has normal function. The left ventricle has no regional wall motion abnormalities. The left ventricular internal cavity size was normal in size. There is  no left ventricular hypertrophy. Left ventricular diastolic parameters are consistent with Grade II diastolic dysfunction (pseudonormalization). Elevated left atrial pressure. Right Ventricle: The right ventricular size is normal. No increase in right ventricular wall thickness. Right ventricular systolic function is normal. Left Atrium: Left atrial size was mild to moderately dilated. Right Atrium: Right atrial size was mildly dilated. Pericardium: Trivial pericardial effusion is present. Mitral Valve: The mitral valve is normal in structure. There is moderate thickening of the mitral valve leaflet(s). There is moderate calcification of the mitral valve leaflet(s). Moderate mitral annular calcification. Mild mitral valve regurgitation. No  evidence of mitral valve stenosis. Tricuspid Valve: The tricuspid valve is normal in structure. Tricuspid valve regurgitation is mild . No evidence of tricuspid stenosis. Aortic Valve: The aortic valve is normal in structure. There is moderate calcification of the aortic valve. There is moderate thickening of the aortic valve. Aortic valve regurgitation is mild. Mild aortic stenosis is present. Aortic valve mean gradient measures 10.0 mmHg. Pulmonic Valve: The pulmonic valve was normal in structure. Pulmonic valve regurgitation is not visualized. No evidence of pulmonic stenosis. Aorta: The aortic root is normal in size and structure. Venous: The inferior vena cava is normal in size with greater than 50% respiratory variability, suggesting right atrial pressure of 3 mmHg. IAS/Shunts: No atrial level shunt detected by color flow Doppler.  LEFT VENTRICLE PLAX 2D LVIDd:         5.10 cm      Diastology LVIDs:         3.60 cm      LV e' medial:    3.50 cm/s LV PW:         0.90 cm      LV E/e' medial:   28.1 LV IVS:        0.90 cm      LV e' lateral:  6.00 cm/s LVOT diam:     2.10 cm      LV E/e' lateral: 16.4 LV SV:         69 LV SV Index:   45 LVOT Area:     3.46 cm  LV Volumes (MOD) LV vol d, MOD A2C: 102.0 ml LV vol d, MOD A4C: 87.6 ml LV vol s, MOD A2C: 47.1 ml LV vol s, MOD A4C: 46.4 ml LV SV MOD A2C:     54.9 ml LV SV MOD A4C:     87.6 ml LV SV MOD BP:      51.1 ml RIGHT VENTRICLE RV S prime:     10.70 cm/s TAPSE (M-mode): 1.4 cm LEFT ATRIUM             Index       RIGHT ATRIUM           Index LA diam:        4.50 cm 2.89 cm/m  RA Area:     11.30 cm LA Vol (A2C):   59.4 ml 38.18 ml/m RA Volume:   24.60 ml  15.81 ml/m LA Vol (A4C):   40.9 ml 26.29 ml/m LA Biplane Vol: 49.8 ml 32.01 ml/m  AORTIC VALVE AV Mean Grad: 10.0 mmHg LVOT Vmax:    94.60 cm/s LVOT Vmean:   63.500 cm/s LVOT VTI:     0.200 m  AORTA Ao Root diam: 3.00 cm MITRAL VALVE MV Area (PHT): 3.85 cm    SHUNTS MV Decel Time: 197 msec    Systemic VTI:  0.20 m MV E velocity: 98.50 cm/s  Systemic Diam: 2.10 cm MV A velocity: 71.60 cm/s MV E/A ratio:  1.38 Ena Dawley MD Electronically signed by Ena Dawley MD Signature Date/Time: 11/18/2019/1:52:00 PM    Final     CT ANGIO HEAD CODE STROKE   Result Date: 11/17/2019 CLINICAL DATA:  TIA EXAM: CT ANGIOGRAPHY HEAD AND NECK TECHNIQUE: Multidetector CT imaging of the head and neck was performed using the standard protocol during bolus administration of intravenous contrast. Multiplanar CT image reconstructions and MIPs were obtained to evaluate the vascular anatomy. Carotid stenosis measurements (when applicable) are obtained utilizing NASCET criteria, using the distal internal carotid diameter as the denominator. CONTRAST:  41mL OMNIPAQUE IOHEXOL 350 MG/ML SOLN COMPARISON:  None. FINDINGS: CTA NECK FINDINGS SKELETON: There is no bony spinal canal stenosis. No lytic or blastic lesion. OTHER NECK: Heterogeneous and enlarged right thyroid lobe measuring 3 1 x 2.5 cm. UPPER CHEST: Biapical  emphysema AORTIC ARCH: There is calcific atherosclerosis of the aortic arch. There is no aneurysm, dissection or hemodynamically significant stenosis of the visualized portion of the aorta. Conventional 3 vessel aortic branching pattern. The visualized proximal subclavian arteries are widely patent. RIGHT CAROTID SYSTEM: No dissection, occlusion or aneurysm. There is predominantly calcified atherosclerosis extending into the proximal ICA, resulting in 50% stenosis. LEFT CAROTID SYSTEM: No dissection, occlusion or aneurysm. Mild atherosclerotic calcification at the carotid bifurcation without hemodynamically significant stenosis. VERTEBRAL ARTERIES: Codominant configuration. Both origins are clearly patent. There is no dissection, occlusion or flow-limiting stenosis to the skull base (V1-V3 segments). CTA HEAD FINDINGS POSTERIOR CIRCULATION: --Vertebral arteries: Normal V4 segments. --Inferior cerebellar arteries: Normal. --Basilar artery: Normal. --Superior cerebellar arteries: Normal. --Posterior cerebral arteries (PCA): Normal. ANTERIOR CIRCULATION: --Intracranial internal carotid arteries: Atherosclerotic calcification of the internal carotid arteries at the skull base without hemodynamically significant stenosis. --Anterior cerebral arteries (ACA): Normal. Both A1 segments are present. Patent anterior communicating artery (a-comm). --Middle  cerebral arteries (MCA): Normal. VENOUS SINUSES: As permitted by contrast timing, patent. ANATOMIC VARIANTS: None Review of the MIP images confirms the above findings. IMPRESSION: 1. No intracranial arterial occlusion or high-grade stenosis. 2. Approximately 50% stenosis of the proximal right internal carotid artery secondary to predominantly calcified atherosclerosis. 3. Heterogeneous and enlarged right thyroid lobe measuring 3 1 x 2.5 cm. Recommend thyroid ultrasound (ref: J Am Coll Radiol. 2015 Feb;12(2): 143-50). Aortic Atherosclerosis (ICD10-I70.0) and Emphysema  (ICD10-J43.9). Electronically Signed   By: Ulyses Jarred M.D.   On: 11/17/2019 21:51    CT ANGIO NECK CODE STROKE   Result Date: 11/17/2019 CLINICAL DATA:  TIA EXAM: CT ANGIOGRAPHY HEAD AND NECK TECHNIQUE: Multidetector CT imaging of the head and neck was performed using the standard protocol during bolus administration of intravenous contrast. Multiplanar CT image reconstructions and MIPs were obtained to evaluate the vascular anatomy. Carotid stenosis measurements (when applicable) are obtained utilizing NASCET criteria, using the distal internal carotid diameter as the denominator. CONTRAST:  104mL OMNIPAQUE IOHEXOL 350 MG/ML SOLN COMPARISON:  None. FINDINGS: CTA NECK FINDINGS SKELETON: There is no bony spinal canal stenosis. No lytic or blastic lesion. OTHER NECK: Heterogeneous and enlarged right thyroid lobe measuring 3 1 x 2.5 cm. UPPER CHEST: Biapical emphysema AORTIC ARCH: There is calcific atherosclerosis of the aortic arch. There is no aneurysm, dissection or hemodynamically significant stenosis of the visualized portion of the aorta. Conventional 3 vessel aortic branching pattern. The visualized proximal subclavian arteries are widely patent. RIGHT CAROTID SYSTEM: No dissection, occlusion or aneurysm. There is predominantly calcified atherosclerosis extending into the proximal ICA, resulting in 50% stenosis. LEFT CAROTID SYSTEM: No dissection, occlusion or aneurysm. Mild atherosclerotic calcification at the carotid bifurcation without hemodynamically significant stenosis. VERTEBRAL ARTERIES: Codominant configuration. Both origins are clearly patent. There is no dissection, occlusion or flow-limiting stenosis to the skull base (V1-V3 segments). CTA HEAD FINDINGS POSTERIOR CIRCULATION: --Vertebral arteries: Normal V4 segments. --Inferior cerebellar arteries: Normal. --Basilar artery: Normal. --Superior cerebellar arteries: Normal. --Posterior cerebral arteries (PCA): Normal. ANTERIOR CIRCULATION:  --Intracranial internal carotid arteries: Atherosclerotic calcification of the internal carotid arteries at the skull base without hemodynamically significant stenosis. --Anterior cerebral arteries (ACA): Normal. Both A1 segments are present. Patent anterior communicating artery (a-comm). --Middle cerebral arteries (MCA): Normal. VENOUS SINUSES: As permitted by contrast timing, patent. ANATOMIC VARIANTS: None Review of the MIP images confirms the above findings. IMPRESSION: 1. No intracranial arterial occlusion or high-grade stenosis. 2. Approximately 50% stenosis of the proximal right internal carotid artery secondary to predominantly calcified atherosclerosis. 3. Heterogeneous and enlarged right thyroid lobe measuring 3 1 x 2.5 cm. Recommend thyroid ultrasound (ref: J Am Coll Radiol. 2015 Feb;12(2): 143-50). Aortic Atherosclerosis (ICD10-I70.0) and Emphysema (ICD10-J43.9). Electronically Signed   By: Ulyses Jarred M.D.   On: 11/17/2019 21:51             Medical Problem List and Plan: 1.  Blurred vision with gait disturbance secondary to right basal ganglia infarction.  Event monitor to be arranged for outpatient             -patient may shower             -ELOS/Goals: 10-12 days modI 2.  Antithrombotics: -DVT/anticoagulation:  SCD             -antiplatelet therapy: Aspirin 81 mg daily and Plavix 75 mg daily x3 weeks then aspirin alone 3. Pain Management: Tylenol as needed. Add kpad for upper back muscle spasms.  4. Mood: Provide emotional support             -  antipsychotic agents: N/A 5. Neuropsych: This patient is capable of making decisions on her own behalf. 6. Skin/Wound Care: Routine skin checks. May discontinue IV 7. Fluids/Electrolytes/Nutrition: Routine in and outs with follow-up chemistries 8.  Hypertension.  Norvasc 10 mg daily, Tenormin 25 mg daily. Will add po Hydralazine 25mg  q8H prn for SBP>180.  Monitor with increased mobility 9.  Hyperlipidemia.  Lipitor 10.  History of colon  cancer with colectomy.  Follow-up outpatient 11.  Anemia of chronic disease.  Follow-up CBC 12.  Incidental finding of nontoxic multinodular goiter.  Follow-up outpatient ultrasound   Cathlyn Parsons, PA-C 11/18/2019    I have personally performed a face to face diagnostic evaluation, including, but not limited to relevant history and physical exam findings, of this patient and developed relevant assessment and plan.  Additionally, I have reviewed and concur with the physician assistant's documentation above.   Leeroy Cha, MD

## 2019-11-19 NOTE — Discharge Summary (Signed)
Physician Discharge Summary  Cindy Smith QJJ:[id number] DOB: Oct 25, 1923 DOA: 11/17/2019  PCP: Pcp, No  Admit date: 11/17/2019 Discharge date: 11/19/2019  Admitted From: Home Disposition: CIR  Recommendations for Outpatient Follow-up:  1. Follow up with PCP in 1-2 weeks 2. Follow-up with neurology in 4 to 6 weeks after discharge from CIR 3. Please get ultrasound thyroid as outpatient as soon as possible after discharge from CIR 4. Take aspirin and Plavix for next 20 days and then stop Plavix but continue aspirin indefinitely. 5. Please obtain BMP/CBC in one week 6. Please follow up with your PCP on the following pending results: Unresulted Labs (From admission, onward)          Start     Ordered   11/19/19 1008  TSH  Once,   R        11/19/19 1007   Signed and Held  Comprehensive metabolic panel  Once,   R        Signed and Held   Signed and Held  CBC WITH DIFFERENTIAL  Once,   R        Signed and [place]: None Equipment/Devices: None  Discharge Condition: Stable CODE STATUS: Full code Diet recommendation: Cardiac/low-sodium  Subjective: Seen and examined.  Just feels weak but no any specific complaint.  Remains alert and oriented.  Brief/Interim Summary: Cindy Kijowski Shackelfordis a 86 y.o.femalewith medical history significant ofhypertension, colon cancer status post colectomy, anemia of chronic disease, nontoxic multinodular goiter presented to emergency department for evaluation of difficulty walking. Per daughter,  patient has difficulty with walking since 2 years however started getting worse since 1week. Daughter also noticed that patient started feeling foggy and has difficulty following conversations. No history of slurred speech, headache, lightheadedness, dizziness, head trauma, loss of consciousness, facial drop, chest pain, shortness of breath, fever, chills, cough, congestion, nausea, vomiting, abdominal pain, urinary or bowel  changes. She has history of neuropathy however does not take any medications at home. She has internal rotation of left knee since 4 years. No trauma. She lives alone and independent on daily life activities. She uses a roller walker for ambulation.  Upon arrival to ED: Patient blood pressure was noted to be in220s/70s, afebrile with no leukocytosis, maintaining oxygen saturation on room air, BMP shows AKI. CT head negative for acute findings.  Patient was admitted under hospital service.  Neurology consulted.  MRI brain shows acute small vessel infarct in the right coronary radiator and external capsule.  CTA head and neck negative for LVO.  Transthoracic echo with normal ejection fraction and no PFO.  Patient was started on aspirin and Plavix.  She was seen by PT OT and they recommended CIR.  Her blood pressure remained elevated for which she was initially started on amlodipine 5 mg p.o. daily.  Her blood pressure improved but still elevated.  This was increased to 10 mg.  Blood pressure better now.  She was also started on atorvastatin 40 mg for hyperlipidemia.  AKI resolved with IV hydration.  This was likely secondary to dehydration or NSAID use.  CT angio neck showed thyroid nodule.  She has previous history of multinodular goiter.  She is not on any medications now but apparently she was treated for hyperthyroidism in the past.  Her TSH is pending.  She has no symptoms of hypothyroidism or hyperthyroidism.  If normal TSH, she will need to follow-up with PCP or endocrinology for ultrasound  thyroid.  Neurology also recommended 30-day event monitor for which I contacted cardiology and they will arrange this for her  Discharge Diagnoses:  Principal Problem:   Ischemic stroke [facility]) Active Problems:   AKI (acute kidney injury) ([place])   Hyponatremia   Normocytic anemia   Hypertensive urgency    Discharge Instructions   Allergies as of 11/19/2019      Reactions   Codeine    "I just pass out"    Other Other (See Comments)   Steroid (Lotemax eye drops)      Medication List    TAKE these medications   amLODipine 10 MG tablet Commonly known as: NORVASC Take 1 tablet (10 mg total) by mouth daily. Start taking on: November 20, 2019   aspirin 81 MG EC tablet Take 1 tablet (81 mg total) by mouth daily. Swallow whole.   atenolol 25 MG tablet Commonly known as: TENORMIN Take 25 mg by mouth daily.   atorvastatin 40 MG tablet Commonly known as: LIPITOR Take 1 tablet (40 mg total) by mouth daily.   clopidogrel 75 MG tablet Commonly known as: PLAVIX Take 1 tablet (75 mg total) by mouth daily for 20 days.   cyanocobalamin 1000 MCG tablet Take 1,000 mcg by mouth daily.   latanoprost 0.005 % ophthalmic solution Commonly known as: XALATAN Place 1 drop into both eyes every evening.   VITAMIN D (CHOLECALCIFEROL) PO Take 1 tablet by mouth daily.       Follow-up Information    [facility] Follow up in 4 week(s).   Specialty: Neurology Why: stroke clinic follow rehab stay, office will call with appt date and time Contact information: [place] [address] [phone]             Allergies  Allergen Reactions  . Codeine     "I just pass out"  . Other Other (See Comments)    Steroid (Lotemax eye drops)    Consultations: Neurology   Procedures/Studies: CT HEAD WO CONTRAST  Result Date: 11/17/2019 CLINICAL DATA:  Acute neuro deficit.  Rule out stroke.  Weakness. EXAM: CT HEAD WITHOUT CONTRAST TECHNIQUE: Contiguous axial images were obtained from the base of the skull through the vertex without intravenous contrast. COMPARISON:  None. FINDINGS: Brain: Mild atrophy. Extensive white matter hypodensity diffusely bilaterally. Negative for acute cortical infarct. Negative for hemorrhage or mass. Vascular: Negative for hyperdense vessel Skull: Negative Sinuses/Orbits: Air-fluid level sphenoid sinus. Remaining  sinuses clear. Mastoid clear bilaterally. Bilateral cataract extraction. Other: Multiple skin lesions in the scalp. Calcified dermal lesion right parietal scalp most likely Pilar cyst. IMPRESSION: Atrophy with diffuse white matter disease most likely chronic microvascular ischemia No acute abnormality. Electronically Signed   By: Franchot Gallo M.D.   On: 11/17/2019 14:52   MR BRAIN WO CONTRAST  Result Date: 11/17/2019 CLINICAL DATA:  86 year old female with acute weakness, imbalance. EXAM: MRI HEAD WITHOUT CONTRAST TECHNIQUE: Multiplanar, multiecho pulse sequences of the brain and surrounding structures were obtained without intravenous contrast. COMPARISON:  Head CT earlier today. FINDINGS: Brain: Linear, patchy restricted diffusion in the right corona radiata tracking toward the external capsule (series 5, image 75 and series 7, image 62). No other restricted diffusion. Underlying Patchy and confluent bilateral cerebral white matter T2 and FLAIR hyperintensity. Similar signal heterogeneity throughout the bilateral basal ganglia, worse on the right. Occasional chronic micro hemorrhages (left periatrial white matter series 14, image 29). No evidence of acute hemorrhage. No mass effect. No midline shift,  evidence of mass lesion, ventriculomegaly, extra-axial collection. Cervicomedullary junction and pituitary are within normal limits. Mild for age signal heterogeneity in the pons. No cortical encephalomalacia identified. Vascular: Major intracranial vascular flow voids are preserved. Skull and upper cervical spine: Cervical spine detailed separately today. Normal visible bone marrow signal. Sinuses/Orbits: Negative, postoperative changes to both globes. Other: Mastoids are well pneumatized. Visible internal auditory structures appear normal. Scalp and face appear negative. IMPRESSION: 1. Acute small vessel infarct in the right corona radiata, external capsule. No associated hemorrhage or mass effect. 2.  Underlying moderate to advanced chronic small vessel disease. Electronically Signed   By: Genevie Ann M.D.   On: 11/17/2019 18:44   MR Cervical Spine Wo Contrast  Result Date: 11/17/2019 CLINICAL DATA:  86 year old female with acute weakness, imbalance. EXAM: MRI CERVICAL SPINE WITHOUT CONTRAST TECHNIQUE: Multiplanar, multisequence MR imaging of the cervical spine was performed. No intravenous contrast was administered. COMPARISON:  Brain MRI today reported separately. FINDINGS: Alignment: Mild degenerative anterolisthesis of C2 on C3, C7 on T1. Overall straightening of cervical lordosis. Vertebrae: Widespread degenerative endplate marrow signal changes in the cervical spine. No marrow edema or evidence of acute osseous abnormality. Benign vertebral body hemangioma at T2 on the right. Cord: No cervical spinal cord signal abnormality despite multilevel degenerative stenosis with some cord mass effect detailed below. Visible upper thoracic cord appears normal. Posterior Fossa, vertebral arteries, paraspinal tissues: Cervicomedullary junction is within normal limits. Brain is detailed separately today. Preserved major vascular flow voids in the neck. Thyromegaly (series 7, image 6 on the right, but no follow-up indicated in this age group (ref: J Am Coll Radiol. 2015 Feb;12(2): 143-50). Disc levels: C2-C3: Anterolisthesis associated with moderate left and severe right facet hypertrophy. Ligament flavum hypertrophy. No spinal stenosis. Moderate to severe bilateral C3 foraminal stenosis greater on the right. C3-C4: Severe disc space loss. Circumferential disc osteophyte complex. Mild to moderate facet and ligament flavum hypertrophy. Mild spinal stenosis. Mild if any cord mass effect. Severe bilateral C4 foraminal stenosis. C4-C5: Severe disc space loss. Circumferential disc osteophyte complex. Mild to moderate posterior element hypertrophy. Mild spinal stenosis and spinal cord mass effect. Severe bilateral C5 foraminal  stenosis. C5-C6: Disc space loss with circumferential disc osteophyte complex. Mild to moderate posterior element hypertrophy. No significant spinal stenosis. Moderate left and moderate to severe right C6 foraminal stenosis. C6-C7: Mild disc space loss. Circumferential disc osteophyte complex. Moderate facet and ligament flavum hypertrophy. No significant spinal stenosis. Moderate bilateral C7 foraminal stenosis. C7-T1: Mild anterolisthesis. Mild to moderate posterior element hypertrophy. No significant stenosis. IMPRESSION: 1. No acute osseous abnormality in the cervical spine. Mild degenerative spondylolisthesis at C2-C3 and C7-T1 with facet arthropathy. 2. Widespread cervical spine degeneration. Mild spinal stenosis at C3-C4 and C4-C5 with up to mild spinal cord mass effect, but no cord signal abnormality. 3. Widespread moderate and severe degenerative neural foraminal stenosis, only sparing the C8 nerve level. Electronically Signed   By: Genevie Ann M.D.   On: 11/17/2019 18:56   ECHOCARDIOGRAM COMPLETE  Result Date: 11/18/2019    ECHOCARDIOGRAM REPORT   Patient Name:   Cindy Smith Date of Exam: 11/18/2019 Medical Rec #:  [id number]            Height:       60.0 in Accession #:    [id number]           Weight:       130.3 lb Date of Birth:  08/18/1923  BSA:          1.556 m Patient Age:    38 years             BP:           163/71 mmHg Patient Gender: F                    HR:           68 bpm. Exam Location:  Inpatient Procedure: 2D Echo, Cardiac Doppler and Color Doppler Indications:    Stroke 434.91 / I163.9  History:        Patient has no prior history of Echocardiogram examinations.                 Risk Factors:Hypertension and Non-Smoker.  Sonographer:    Vickie Epley RDCS Referring Phys: [id number] [place]  1. Left ventricular ejection fraction, by estimation, is 65 to 70%. The left ventricle has normal function. The left ventricle has no regional wall motion abnormalities.  Left ventricular diastolic parameters are consistent with Grade II diastolic dysfunction (pseudonormalization). Elevated left atrial pressure.  2. Right ventricular systolic function is normal. The right ventricular size is normal.  3. Left atrial size was mild to moderately dilated.  4. Right atrial size was mildly dilated.  5. The mitral valve is normal in structure. Mild mitral valve regurgitation. No evidence of mitral stenosis. Moderate mitral annular calcification.  6. The aortic valve is normal in structure. There is moderate calcification of the aortic valve. There is moderate thickening of the aortic valve. Aortic valve regurgitation is mild. Mild aortic valve stenosis. Aortic valve mean gradient measures 10.0 mmHg.  7. The inferior vena cava is normal in size with greater than 50% respiratory variability, suggesting right atrial pressure of 3 mmHg. Conclusion(s)/Recommendation(s): No intracardiac source of embolism detected on this transthoracic study. A transesophageal echocardiogram is recommended to exclude cardiac source of embolism if clinically indicated. FINDINGS  Left Ventricle: Left ventricular ejection fraction, by estimation, is 65 to 70%. The left ventricle has normal function. The left ventricle has no regional wall motion abnormalities. The left ventricular internal cavity size was normal in size. There is  no left ventricular hypertrophy. Left ventricular diastolic parameters are consistent with Grade II diastolic dysfunction (pseudonormalization). Elevated left atrial pressure. Right Ventricle: The right ventricular size is normal. No increase in right ventricular wall thickness. Right ventricular systolic function is normal. Left Atrium: Left atrial size was mild to moderately dilated. Right Atrium: Right atrial size was mildly dilated. Pericardium: Trivial pericardial effusion is present. Mitral Valve: The mitral valve is normal in structure. There is moderate thickening of the mitral  valve leaflet(s). There is moderate calcification of the mitral valve leaflet(s). Moderate mitral annular calcification. Mild mitral valve regurgitation. No  evidence of mitral valve stenosis. Tricuspid Valve: The tricuspid valve is normal in structure. Tricuspid valve regurgitation is mild . No evidence of tricuspid stenosis. Aortic Valve: The aortic valve is normal in structure. There is moderate calcification of the aortic valve. There is moderate thickening of the aortic valve. Aortic valve regurgitation is mild. Mild aortic stenosis is present. Aortic valve mean gradient measures 10.0 mmHg. Pulmonic Valve: The pulmonic valve was normal in structure. Pulmonic valve regurgitation is not visualized. No evidence of pulmonic stenosis. Aorta: The aortic root is normal in size and structure. Venous: The inferior vena cava is normal in size with greater than 50% respiratory variability, suggesting right atrial pressure of  3 mmHg. IAS/Shunts: No atrial level shunt detected by color flow Doppler.  LEFT VENTRICLE PLAX 2D LVIDd:         5.10 cm      Diastology LVIDs:         3.60 cm      LV e' medial:    3.50 cm/s LV PW:         0.90 cm      LV E/e' medial:  28.1 LV IVS:        0.90 cm      LV e' lateral:   6.00 cm/s LVOT diam:     2.10 cm      LV E/e' lateral: 16.4 LV SV:         69 LV SV Index:   45 LVOT Area:     3.46 cm  LV Volumes (MOD) LV vol d, MOD A2C: 102.0 ml LV vol d, MOD A4C: 87.6 ml LV vol s, MOD A2C: 47.1 ml LV vol s, MOD A4C: 46.4 ml LV SV MOD A2C:     54.9 ml LV SV MOD A4C:     87.6 ml LV SV MOD BP:      51.1 ml RIGHT VENTRICLE RV S prime:     10.70 cm/s TAPSE (M-mode): 1.4 cm LEFT ATRIUM             Index       RIGHT ATRIUM           Index LA diam:        4.50 cm 2.89 cm/m  RA Area:     11.30 cm LA Vol (A2C):   59.4 ml 38.18 ml/m RA Volume:   24.60 ml  15.81 ml/m LA Vol (A4C):   40.9 ml 26.29 ml/m LA Biplane Vol: 49.8 ml 32.01 ml/m  AORTIC VALVE AV Mean Grad: 10.0 mmHg LVOT Vmax:    94.60 cm/s LVOT  Vmean:   63.500 cm/s LVOT VTI:     0.200 m  AORTA Ao Root diam: 3.00 cm MITRAL VALVE MV Area (PHT): 3.85 cm    SHUNTS MV Decel Time: 197 msec    Systemic VTI:  0.20 m MV E velocity: 98.50 cm/s  Systemic Diam: 2.10 cm MV A velocity: 71.60 cm/s MV E/A ratio:  1.38 Ena Dawley MD Electronically signed by Ena Dawley MD Signature Date/Time: 11/18/2019/1:52:00 PM    Final    CT ANGIO HEAD CODE STROKE  Result Date: 11/17/2019 CLINICAL DATA:  TIA EXAM: CT ANGIOGRAPHY HEAD AND NECK TECHNIQUE: Multidetector CT imaging of the head and neck was performed using the standard protocol during bolus administration of intravenous contrast. Multiplanar CT image reconstructions and MIPs were obtained to evaluate the vascular anatomy. Carotid stenosis measurements (when applicable) are obtained utilizing NASCET criteria, using the distal internal carotid diameter as the denominator. CONTRAST:  38mL OMNIPAQUE IOHEXOL 350 MG/ML SOLN COMPARISON:  None. FINDINGS: CTA NECK FINDINGS SKELETON: There is no bony spinal canal stenosis. No lytic or blastic lesion. OTHER NECK: Heterogeneous and enlarged right thyroid lobe measuring 3 1 x 2.5 cm. UPPER CHEST: Biapical emphysema AORTIC ARCH: There is calcific atherosclerosis of the aortic arch. There is no aneurysm, dissection or hemodynamically significant stenosis of the visualized portion of the aorta. Conventional 3 vessel aortic branching pattern. The visualized proximal subclavian arteries are widely patent. RIGHT CAROTID SYSTEM: No dissection, occlusion or aneurysm. There is predominantly calcified atherosclerosis extending into the proximal ICA, resulting in 50% stenosis. LEFT CAROTID SYSTEM: No dissection, occlusion  or aneurysm. Mild atherosclerotic calcification at the carotid bifurcation without hemodynamically significant stenosis. VERTEBRAL ARTERIES: Codominant configuration. Both origins are clearly patent. There is no dissection, occlusion or flow-limiting stenosis to  the skull base (V1-V3 segments). CTA HEAD FINDINGS POSTERIOR CIRCULATION: --Vertebral arteries: Normal V4 segments. --Inferior cerebellar arteries: Normal. --Basilar artery: Normal. --Superior cerebellar arteries: Normal. --Posterior cerebral arteries (PCA): Normal. ANTERIOR CIRCULATION: --Intracranial internal carotid arteries: Atherosclerotic calcification of the internal carotid arteries at the skull base without hemodynamically significant stenosis. --Anterior cerebral arteries (ACA): Normal. Both A1 segments are present. Patent anterior communicating artery (a-comm). --Middle cerebral arteries (MCA): Normal. VENOUS SINUSES: As permitted by contrast timing, patent. ANATOMIC VARIANTS: None Review of the MIP images confirms the above findings. IMPRESSION: 1. No intracranial arterial occlusion or high-grade stenosis. 2. Approximately 50% stenosis of the proximal right internal carotid artery secondary to predominantly calcified atherosclerosis. 3. Heterogeneous and enlarged right thyroid lobe measuring 3 1 x 2.5 cm. Recommend thyroid ultrasound (ref: J Am Coll Radiol. 2015 Feb;12(2): 143-50). Aortic Atherosclerosis (ICD10-I70.0) and Emphysema (ICD10-J43.9). Electronically Signed   By: Ulyses Jarred M.D.   On: 11/17/2019 21:51   CT ANGIO NECK CODE STROKE  Result Date: 11/17/2019 CLINICAL DATA:  TIA EXAM: CT ANGIOGRAPHY HEAD AND NECK TECHNIQUE: Multidetector CT imaging of the head and neck was performed using the standard protocol during bolus administration of intravenous contrast. Multiplanar CT image reconstructions and MIPs were obtained to evaluate the vascular anatomy. Carotid stenosis measurements (when applicable) are obtained utilizing NASCET criteria, using the distal internal carotid diameter as the denominator. CONTRAST:  60mL OMNIPAQUE IOHEXOL 350 MG/ML SOLN COMPARISON:  None. FINDINGS: CTA NECK FINDINGS SKELETON: There is no bony spinal canal stenosis. No lytic or blastic lesion. OTHER NECK:  Heterogeneous and enlarged right thyroid lobe measuring 3 1 x 2.5 cm. UPPER CHEST: Biapical emphysema AORTIC ARCH: There is calcific atherosclerosis of the aortic arch. There is no aneurysm, dissection or hemodynamically significant stenosis of the visualized portion of the aorta. Conventional 3 vessel aortic branching pattern. The visualized proximal subclavian arteries are widely patent. RIGHT CAROTID SYSTEM: No dissection, occlusion or aneurysm. There is predominantly calcified atherosclerosis extending into the proximal ICA, resulting in 50% stenosis. LEFT CAROTID SYSTEM: No dissection, occlusion or aneurysm. Mild atherosclerotic calcification at the carotid bifurcation without hemodynamically significant stenosis. VERTEBRAL ARTERIES: Codominant configuration. Both origins are clearly patent. There is no dissection, occlusion or flow-limiting stenosis to the skull base (V1-V3 segments). CTA HEAD FINDINGS POSTERIOR CIRCULATION: --Vertebral arteries: Normal V4 segments. --Inferior cerebellar arteries: Normal. --Basilar artery: Normal. --Superior cerebellar arteries: Normal. --Posterior cerebral arteries (PCA): Normal. ANTERIOR CIRCULATION: --Intracranial internal carotid arteries: Atherosclerotic calcification of the internal carotid arteries at the skull base without hemodynamically significant stenosis. --Anterior cerebral arteries (ACA): Normal. Both A1 segments are present. Patent anterior communicating artery (a-comm). --Middle cerebral arteries (MCA): Normal. VENOUS SINUSES: As permitted by contrast timing, patent. ANATOMIC VARIANTS: None Review of the MIP images confirms the above findings. IMPRESSION: 1. No intracranial arterial occlusion or high-grade stenosis. 2. Approximately 50% stenosis of the proximal right internal carotid artery secondary to predominantly calcified atherosclerosis. 3. Heterogeneous and enlarged right thyroid lobe measuring 3 1 x 2.5 cm. Recommend thyroid ultrasound (ref: J Am Coll  Radiol. 2015 Feb;12(2): 143-50). Aortic Atherosclerosis (ICD10-I70.0) and Emphysema (ICD10-J43.9). Electronically Signed   By: Ulyses Jarred M.D.   On: 11/17/2019 21:51     Discharge Exam: Vitals:   11/19/19 0751 11/19/19 0810  BP:  (!) 175/68  Pulse:    Resp:  20  Temp: 98 F (36.7 C)   SpO2:     Vitals:   11/19/19 0010 11/19/19 0425 11/19/19 0751 11/19/19 0810  BP: (!) 149/61 (!) 176/58  (!) 175/68  Pulse: (!) 57 62    Resp: 16 17  20   Temp: 98.9 F (37.2 C) 98.2 F (36.8 C) 98 F (36.7 C)   TempSrc: Axillary Oral Oral   SpO2: 100% 95%    Weight:      Height:        General: Pt is alert, awake, not in acute distress Cardiovascular: RRR, S1/S2 +, no rubs, no gallops Respiratory: CTA bilaterally, no wheezing, no rhonchi Abdominal: Soft, NT, ND, bowel sounds + Extremities: no edema, no cyanosis    The results of significant diagnostics from this hospitalization (including imaging, microbiology, ancillary and laboratory) are listed below for reference.     Microbiology: Recent Results (from the past 240 hour(s))  Respiratory Panel by RT PCR (Flu A&B, Covid) - Nasopharyngeal Swab     Status: None   Collection Time: 11/17/19  5:32 PM   Specimen: Nasopharyngeal Swab  Result Value Ref Range Status   SARS Coronavirus 2 by RT PCR NEGATIVE NEGATIVE Final    Comment: (NOTE) SARS-CoV-2 target nucleic acids are NOT DETECTED.  The SARS-CoV-2 RNA is generally detectable in upper respiratoy specimens during the acute phase of infection. The lowest concentration of SARS-CoV-2 viral copies this assay can detect is 131 copies/mL. A negative result does not preclude SARS-Cov-2 infection and should not be used as the sole basis for treatment or other patient management decisions. A negative result may occur with  improper specimen collection/handling, submission of specimen other than nasopharyngeal swab, presence of viral mutation(s) within the areas targeted by this assay, and  inadequate number of viral copies (<131 copies/mL). A negative result must be combined with clinical observations, patient history, and epidemiological information. The expected result is Negative.  Fact Sheet for Patients:  PinkCheek.be  Fact Sheet for Healthcare Providers:  GravelBags.it  This test is no t yet approved or cleared by the [place] FDA and  has been authorized for detection and/or diagnosis of SARS-CoV-2 by FDA under an Emergency Use Authorization (EUA). This EUA will remain  in effect (meaning this test can be used) for the duration of the COVID-19 declaration under Section 564(b)(1) of the Act, 21 U.S.C. section 360bbb-3(b)(1), unless the authorization is terminated or revoked sooner.     Influenza A by PCR NEGATIVE NEGATIVE Final   Influenza B by PCR NEGATIVE NEGATIVE Final    Comment: (NOTE) The Xpert Xpress SARS-CoV-2/FLU/RSV assay is intended as an aid in  the diagnosis of influenza from Nasopharyngeal swab specimens and  should not be used as a sole basis for treatment. Nasal washings and  aspirates are unacceptable for Xpert Xpress SARS-CoV-2/FLU/RSV  testing.  Fact Sheet for Patients: PinkCheek.be  Fact Sheet for Healthcare Providers: GravelBags.it  This test is not yet approved or cleared by the [place] FDA and  has been authorized for detection and/or diagnosis of SARS-CoV-2 by  FDA under an Emergency Use Authorization (EUA). This EUA will remain  in effect (meaning this test can be used) for the duration of the  Covid-19 declaration under Section 564(b)(1) of the Act, 21  U.S.C. section 360bbb-3(b)(1), unless the authorization is  terminated or revoked. Performed at [facility], [place] [address] [place], [place] [address]      Labs: BNP (last 3 results) No results for  input(s): BNP in the last 8760 hours. Basic  Metabolic Panel: Recent Labs  Lab 11/17/19 1341 11/18/19 0440  NA 133* 135  K 4.4 3.9  CL 100 100  CO2 23 21*  GLUCOSE 108* 88  BUN 22 21  CREATININE 1.10* 1.03*  CALCIUM 9.8 9.8  MG  --  2.2   Liver Function Tests: No results for input(s): AST, ALT, ALKPHOS, BILITOT, PROT, ALBUMIN in the last 168 hours. No results for input(s): LIPASE, AMYLASE in the last 168 hours. No results for input(s): AMMONIA in the last 168 hours. CBC: Recent Labs  Lab 11/17/19 1341 11/18/19 0007  WBC 6.2 6.5  HGB 11.3* 11.1*  HCT 35.8* 34.3*  MCV 94.2 95.3  PLT 234 223   Cardiac Enzymes: No results for input(s): CKTOTAL, CKMB, CKMBINDEX, TROPONINI in the last 168 hours. BNP: Invalid input(s): POCBNP CBG: No results for input(s): GLUCAP in the last 168 hours. D-Dimer No results for input(s): DDIMER in the last 72 hours. Hgb A1c Recent Labs    11/18/19 0007  HGBA1C 5.4   Lipid Profile Recent Labs    11/18/19 0440  CHOL 212*  HDL 62  LDLCALC 132*  TRIG 90  CHOLHDL 3.4   Thyroid function studies No results for input(s): TSH, T4TOTAL, T3FREE, THYROIDAB in the last 72 hours.  Invalid input(s): FREET3 Anemia work up No results for input(s): VITAMINB12, FOLATE, FERRITIN, TIBC, IRON, RETICCTPCT in the last 72 hours. Urinalysis    Component Value Date/Time   COLORURINE YELLOW 11/17/2019 1744   APPEARANCEUR HAZY (A) 11/17/2019 1744   LABSPEC 1.008 11/17/2019 1744   PHURINE 6.0 11/17/2019 1744   GLUCOSEU NEGATIVE 11/17/2019 1744   HGBUR NEGATIVE 11/17/2019 1744   [place] 11/17/2019 [place] 11/17/2019 1744   PROTEINUR 30 (A) 11/17/2019 1744   NITRITE NEGATIVE 11/17/2019 1744   LEUKOCYTESUR LARGE (A) 11/17/2019 1744   Sepsis Labs Invalid input(s): PROCALCITONIN,  WBC,  LACTICIDVEN Microbiology Recent Results (from the past 240 hour(s))  Respiratory Panel by RT PCR (Flu A&B, Covid) - Nasopharyngeal Swab     Status: None   Collection Time:  11/17/19  5:32 PM   Specimen: Nasopharyngeal Swab  Result Value Ref Range Status   SARS Coronavirus 2 by RT PCR NEGATIVE NEGATIVE Final    Comment: (NOTE) SARS-CoV-2 target nucleic acids are NOT DETECTED.  The SARS-CoV-2 RNA is generally detectable in upper respiratoy specimens during the acute phase of infection. The lowest concentration of SARS-CoV-2 viral copies this assay can detect is 131 copies/mL. A negative result does not preclude SARS-Cov-2 infection and should not be used as the sole basis for treatment or other patient management decisions. A negative result may occur with  improper specimen collection/handling, submission of specimen other than nasopharyngeal swab, presence of viral mutation(s) within the areas targeted by this assay, and inadequate number of viral copies (<131 copies/mL). A negative result must be combined with clinical observations, patient history, and epidemiological information. The expected result is Negative.  Fact Sheet for Patients:  PinkCheek.be  Fact Sheet for Healthcare Providers:  GravelBags.it  This test is no t yet approved or cleared by the [place] FDA and  has been authorized for detection and/or diagnosis of SARS-CoV-2 by FDA under an Emergency Use Authorization (EUA). This EUA will remain  in effect (meaning this test can be used) for the duration of the COVID-19 declaration under Section 564(b)(1) of the Act, 21 U.S.C. section 360bbb-3(b)(1), unless the authorization is terminated or revoked  sooner.     Influenza A by PCR NEGATIVE NEGATIVE Final   Influenza B by PCR NEGATIVE NEGATIVE Final    Comment: (NOTE) The Xpert Xpress SARS-CoV-2/FLU/RSV assay is intended as an aid in  the diagnosis of influenza from Nasopharyngeal swab specimens and  should not be used as a sole basis for treatment. Nasal washings and  aspirates are unacceptable for Xpert Xpress  SARS-CoV-2/FLU/RSV  testing.  Fact Sheet for Patients: PinkCheek.be  Fact Sheet for Healthcare Providers: GravelBags.it  This test is not yet approved or cleared by the [place] FDA and  has been authorized for detection and/or diagnosis of SARS-CoV-2 by  FDA under an Emergency Use Authorization (EUA). This EUA will remain  in effect (meaning this test can be used) for the duration of the  Covid-19 declaration under Section 564(b)(1) of the Act, 21  U.S.C. section 360bbb-3(b)(1), unless the authorization is  terminated or revoked. Performed at [facility], [place] [address], [place], [place] [address]      Time coordinating discharge: Over 30 minutes  SIGNED:   Darliss Cheney, MD  [facility] 11/19/2019, 10:07 AM  If 7PM-7AM, please contact night-coverage [email or web address]

## 2019-11-19 NOTE — Progress Notes (Signed)
Physical Therapy Treatment Patient Details Name: Cindy Smith MRN: [id number] DOB: [birth year] Today's Date: 11/19/2019    History of Present Illness 86yo female presenting with difficulty walking, vision changes, numbness and tingling BLEs. BP 220/70s in ED. 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 No tpa given. PMH HTN, chronic L knee injury, colon CA s/p colectomy    PT Comments    Pt alert, agreeable and participative.  Plan to end up in the chair, but transfer to CIR limited session to return to bed.  Emphasis on transitions, scooting, symmetrical sit to stand and transfers with RW to/from the [facility].   Follow Up Recommendations  CIR;Supervision for mobility/OOB     Equipment Recommendations  Rolling walker with 5" wheels;3in1 (PT)    Recommendations for Other Services       Precautions / Restrictions Precautions Precautions: Fall Restrictions Weight Bearing Restrictions: No    Mobility  Bed Mobility Overal bed mobility: Needs Assistance Bed Mobility: Supine to Sit;Sit to Supine     Supine to sit: Min assist Sit to supine: Min assist   General bed mobility comments: cues for direction, truncal stability assist/forward assist to EOB and LE assist getting into bed.  Transfers Overall transfer level: Needs assistance Equipment used: Rolling walker (2 wheeled) Transfers: Sit to/from [facility] Sit to Stand: Mod assist;Min assist (dependent on height of surface) Stand pivot transfers: Mod assist       General transfer comment: needed stability assist, help to maneuver RW during pivot to [facility].  Stability assist during peri-care.  Ambulation/Gait             General Gait Details: deferred due to CIR ready for pt transfer to unit.   Stairs             Wheelchair Mobility    Modified Rankin (Stroke Patients Only) Modified Rankin (Stroke Patients Only) Modified Rankin: Moderately severe  disability     Balance Overall balance assessment: Needs assistance Sitting-balance support: Feet supported;Feet unsupported;No upper extremity supported Sitting balance-Leahy Scale: Fair     Standing balance support: Bilateral upper extremity supported;During functional activity Standing balance-Leahy Scale: Poor                              Cognition Arousal/Alertness: Awake/alert Behavior During Therapy: WFL for tasks assessed/performed Overall Cognitive Status: Within Functional Limits for tasks assessed                                        Exercises      General Comments        Pertinent Vitals/Pain Pain Assessment: Faces Faces Pain Scale: No hurt Pain Intervention(s): Monitored during session    Home Living   Living Arrangements: Alone Available Help at Discharge: Family;Available PRN/intermittently Type of Home: House Home Access: Stairs to enter;Other (comment) (half step to enter) Entrance Stairs-Rails: None Home Layout: One level        Prior Function            PT Goals (current goals can now be found in the care plan section) Acute Rehab PT Goals PT Goal Formulation: With patient/family Time For Goal Achievement: 12/02/19 Potential to Achieve Goals: Fair Progress towards PT goals: Progressing toward goals    Frequency    Min 4X/week  PT Plan Current plan remains appropriate    Co-evaluation              AM-PAC PT "6 Clicks" Mobility   Outcome Measure  Help needed turning from your back to your side while in a flat bed without using bedrails?: A Little Help needed moving from lying on your back to sitting on the side of a flat bed without using bedrails?: A Lot Help needed moving to and from a bed to a chair (including a wheelchair)?: A Lot Help needed standing up from a chair using your arms (e.g., wheelchair or bedside chair)?: A Lot Help needed to walk in hospital room?: A Lot Help needed  climbing 3-5 steps with a railing? : Total 6 Click Score: 12    End of Session   Activity Tolerance: Patient tolerated treatment well Patient left: in bed;with family/visitor present;with call bell/phone within reach Nurse Communication: Mobility status PT Visit Diagnosis: Unsteadiness on feet (R26.81);Other abnormalities of gait and mobility (R26.89);Difficulty in walking, not elsewhere classified (R26.2)     Time: 4270-6237 PT Time Calculation (min) (ACUTE ONLY): 40 min  Charges:  $Therapeutic Activity: 8-22 mins $Neuromuscular Re-education: 8-22 mins $Self Care/Home Management: 8-22                     11/19/2019  Ginger Carne., PT Acute Rehabilitation Services [phone]  (pager) [phone]  (office)   Tessie Fass Ashwin Tibbs 11/19/2019, 2:01 PM

## 2019-11-19 NOTE — Progress Notes (Signed)
Patient ID: Cindy Smith, female   DOB: 10/28/1923, 86 y.o.   MRN: [id number]  Patient admitted to 4W- 23 via bed. Patient oriented to floor cal bell and rehab policy. Patient denies any pain or discomfort at this time. Patient in bed with all needs met and call bell in reach safety ensured. Family at bedside.   

## 2019-11-19 NOTE — Progress Notes (Signed)
Inpatient Rehabilitation Medication Review by a Pharmacist  A complete drug regimen review was completed for this patient to identify any potential clinically significant medication issues.  Clinically significant medication issues were identified:  no  Check AMION for pharmacist assigned to patient if future medication questions/issues arise during this admission.  Pharmacist comments:   Time spent performing this drug regimen review (minutes):  5   Cindy Smith 11/19/2019 2:16 PM

## 2019-11-19 NOTE — H&P (Signed)
Physical Medicine and Rehabilitation Admission H&P     HPI: Cindy Smith is a 86 year old right-handed female with history of hypertension as well as colon cancer with colectomy.  Per chart review lives alone independent with assistive device.  1 level home one-step to entry.  She does have family checks on her routinely.  Presented 11/17/2019 with blurred vision and gait disturbance.  CT/MRI showed acute small vessel infarct in the right corona radiata external capsule.  No associated hemorrhage.  CT angiogram of the head and neck with no intracranial occlusion or high-grade stenosis.  Incidental finding of thyroid nodule.  Echocardiogram with ejection fraction of 65 to 70% no wall motion abnormalities grade 2 diastolic dysfunction.  Admission chemistry sodium 133 glucose 108 creatinine 1.10 urinalysis negative nitrite.  Currently maintained on aspirin and Plavix for CVA prophylaxis.  Tolerating a regular diet.  Therapy evaluations completed and patient was admitted for a comprehensive rehab program.  Review of Systems  Constitutional: Negative for chills and fever.  HENT: Positive for hearing loss.   Eyes: Positive for blurred vision.  Respiratory: Negative for cough and shortness of breath.   Cardiovascular: Negative for chest pain, palpitations and leg swelling.  Gastrointestinal: Positive for constipation. Negative for heartburn, nausea and vomiting.  Genitourinary: Negative for dysuria, flank pain and hematuria.  Musculoskeletal: Positive for joint pain and myalgias.  Skin: Negative for rash.  Neurological: Positive for weakness.  All other systems reviewed and are negative.  Past Medical History:  Diagnosis Date  . HTN (hypertension)   . Hyperthyroidism    Past Surgical History:  Procedure Laterality Date  . COLECTOMY     History reviewed. No pertinent family history. Social History:  reports that she has never smoked. She has never used smokeless tobacco. She reports  that she does not drink alcohol and does not use drugs. Allergies:  Allergies  Allergen Reactions  . Codeine     "I just pass out"  . Other Other (See Comments)    Steroid (Lotemax eye drops)   Medications Prior to Admission  Medication Sig Dispense Refill  . [START ON 11/20/2019] amLODipine (NORVASC) 10 MG tablet Take 1 tablet (10 mg total) by mouth daily. 30 tablet 0  . aspirin EC 81 MG EC tablet Take 1 tablet (81 mg total) by mouth daily. Swallow whole. 30 tablet 0  . atenolol (TENORMIN) 25 MG tablet Take 25 mg by mouth daily.    Marland Kitchen atorvastatin (LIPITOR) 40 MG tablet Take 1 tablet (40 mg total) by mouth daily. 30 tablet 0  . clopidogrel (PLAVIX) 75 MG tablet Take 1 tablet (75 mg total) by mouth daily for 20 days. 20 tablet 0  . cyanocobalamin 1000 MCG tablet Take 1,000 mcg by mouth daily.    Marland Kitchen latanoprost (XALATAN) 0.005 % ophthalmic solution Place 1 drop into both eyes every evening.    Marland Kitchen VITAMIN D, CHOLECALCIFEROL, PO Take 1 tablet by mouth daily.      Drug Regimen Review Drug regimen was reviewed and remains appropriate with no significant issues identified  Home: Home Living Family/patient expects to be discharged to:: Private residence Living Arrangements: Alone Available Help at Discharge: Family, Available PRN/intermittently, Neighbor, Other (Comment) (grandchildren) Type of Home: House Home Access: Stairs to enter [employer] of Steps: 1 with no rail Entrance Stairs-Rails: None Home Layout: One level Bathroom Shower/Tub: Other (comment) (takes birdbaths at the sink) Home Equipment: Walker - 4 wheels Additional Comments: has had neuropathy for awhile, about 4 yars ago  she hurt her knee and never got it evaluated  Lives With: Alone   Functional History: Prior Function Level of Independence: Independent with assistive device(s)  Functional Status:  Mobility: Bed Mobility Overal bed mobility: Needs Assistance Bed Mobility: Supine to Sit Supine to  sit: Min assist, HOB elevated General bed mobility comments: increased time and effort, minA for assist with L LE intermittently Transfers Overall transfer level: Needs assistance Equipment used: Rolling walker (2 wheeled) Transfers: Sit to/from Stand, [employer] Transfers Sit to Stand: Min assist, Mod assist Stand pivot transfers: Mod assist General transfer comment: fluctuating between Min-ModA dependent on fatigue levels, max cues for hand placement and safety; ModA to pivot over to [facility] while controlling RW and maintaining balance Ambulation/Gait General Gait Details: deferred due to stool incontinence- but able to take small shuffle steps during transfers  ADL: ADL Overall ADL's : Needs assistance/impaired Eating/Feeding: Set up, Sitting Upper Body Dressing : Minimal assistance, Sitting Upper Body Dressing Details (indicate cue type and reason): To don posterior gown seated EOB.  Lower Body Dressing: Moderate assistance, Sitting/lateral leans, Sit to/from stand Toilet Transfer: Moderate assistance Toilet Transfer Details (indicate cue type and reason): (P) Mod A with cues for hand placement.  Toileting- Clothing Manipulation and Hygiene: (P) Moderate assistance Toileting - Clothing Manipulation Details (indicate cue type and reason): (P) Mod A for clothing/hygiene management   Cognition: Cognition Overall Cognitive Status: Within Functional Limits for tasks assessed Arousal/Alertness: Awake/alert Orientation Level: Oriented X4 Attention: Focused, Sustained, Selective Focused Attention: Appears intact Sustained Attention: Appears intact Selective Attention: Appears intact Memory: Appears intact Awareness: Appears intact Problem Solving: Appears intact Executive Function: Organizing, Decision Making, Sequencing, Reasoning Reasoning: Appears intact Sequencing: Appears intact Organizing: Appears intact Decision Making: Appears intact Safety/Judgment: Appears  intact Cognition Arousal/Alertness: Awake/alert Behavior During Therapy: WFL for tasks assessed/performed Overall Cognitive Status: Within Functional Limits for tasks assessed General Comments: perhaps a bit HOH and with difficulty understanding through masks/face shield but WNL otherwise   Physical Exam: Blood pressure (!) 159/53, pulse 65, temperature 98.2 F (36.8 C), temperature source Oral, resp. rate 16, height 5' (1.524 m), weight 58.9 kg, SpO2 97 %. General: Alert and oriented x 3, No apparent distress HEENT: Head is normocephalic, atraumatic, PERRLA, EOMI, sclera anicteric, oral mucosa pink and moist, dentition intact, ext ear canals clear,  Neck: Supple without JVD or lymphadenopathy Heart: Reg rate and rhythm. No murmurs rubs or gallops Chest: CTA bilaterally without wheezes, rales, or rhonchi; no distress Abdomen: Soft, non-tender, non-distended, bowel sounds positive. Extremities: No clubbing, cyanosis, or edema. Pulses are 2+ Skin: Clean and intact without signs of breakdown Neuro: Patient is alert in no acute distress.  Makes eye contact with examiner and follows commands.  Oriented x3.  Fair awareness of deficits. 5/5 strength throughout except 4/5 LLE.  Psych: Pt's affect is appropriate. Pt is cooperative   Results for orders placed or performed during the hospital encounter of 11/17/19 (from the past 48 hour(s))  Urinalysis, Routine w reflex microscopic Urine, Clean Catch     Status: Abnormal   Collection Time: 11/17/19  5:44 PM  Result Value Ref Range   Color, Urine YELLOW YELLOW   APPearance HAZY (A) CLEAR   Specific Gravity, Urine 1.008 1.005 - 1.030   pH 6.0 5.0 - 8.0   Glucose, UA NEGATIVE NEGATIVE mg/dL   Hgb urine dipstick NEGATIVE NEGATIVE   Bilirubin Urine NEGATIVE NEGATIVE   Ketones, ur NEGATIVE NEGATIVE mg/dL   Protein, ur 30 (A) NEGATIVE mg/dL  Nitrite NEGATIVE NEGATIVE   Leukocytes,Ua LARGE (A) NEGATIVE   RBC / HPF 0-5 0 - 5 RBC/hpf   WBC, UA  21-50 0 - 5 WBC/hpf   Bacteria, UA FEW (A) NONE SEEN   Squamous Epithelial / LPF 0-5 0 - 5    Comment: Performed at [facility], [place] [address] [place], [place] [address]  Hemoglobin A1c     Status: None   Collection Time: 11/18/19 12:07 AM  Result Value Ref Range   Hgb A1c MFr Bld 5.4 4.8 - 5.6 %    Comment: (NOTE)         Prediabetes: 5.7 - 6.4         Diabetes: >6.4         Glycemic control for adults with diabetes: <7.0    Mean Plasma Glucose 108 mg/dL    Comment: (NOTE) Performed At: [facility] [place], [place] [id number] Rush Farmer MD DG:[id number]   CBC     Status: Abnormal   Collection Time: 11/18/19 12:07 AM  Result Value Ref Range   WBC 6.5 4.0 - 10.5 K/uL   RBC 3.60 (L) 3.87 - 5.11 MIL/uL   Hemoglobin 11.1 (L) 12.0 - 15.0 g/dL   HCT 34.3 (L) 36 - 46 %   MCV 95.3 80.0 - 100.0 fL   MCH 30.8 26.0 - 34.0 pg   MCHC 32.4 30.0 - 36.0 g/dL   RDW 13.6 11.5 - 15.5 %   Platelets 223 150 - 400 K/uL   nRBC 0.0 0.0 - 0.2 %    Comment: Performed at [facility], [place] [address] [place], [place] [address]  Lipid panel     Status: Abnormal   Collection Time: 11/18/19  4:40 AM  Result Value Ref Range   Cholesterol 212 (H) 0 - 200 mg/dL   Triglycerides 90 <150 mg/dL   HDL 62 >40 mg/dL   Total CHOL/HDL Ratio 3.4 RATIO   VLDL 18 0 - 40 mg/dL   LDL Cholesterol 132 (H) 0 - 99 mg/dL    Comment:        Total Cholesterol/HDL:CHD Risk Coronary Heart Disease Risk Table                     Men   Women  1/2 Average Risk   3.4   3.3  Average Risk       5.0   4.4  2 X Average Risk   9.6   7.1  3 X Average Risk  23.4   11.0        Use the calculated Patient Ratio above and the CHD Risk Table to determine the patient's CHD Risk.        ATP III CLASSIFICATION (LDL):  <100     mg/dL   Optimal  100-129  mg/dL   Near or Above                    Optimal  130-159  mg/dL   Borderline  160-189  mg/dL   High  >190     mg/dL   Very  High Performed at [place] [address], [place], [place] [address]   Basic metabolic panel     Status: Abnormal   Collection Time: 11/18/19  4:40 AM  Result Value Ref Range   Sodium 135 135 - 145 mmol/L   Potassium 3.9 3.5 - 5.1 mmol/L   Chloride 100 98 -  111 mmol/L   CO2 21 (L) 22 - 32 mmol/L   Glucose, Bld 88 70 - 99 mg/dL    Comment: Glucose reference range applies only to samples taken after fasting for at least 8 hours.   BUN 21 8 - 23 mg/dL   Creatinine, Ser 1.03 (H) 0.44 - 1.00 mg/dL   Calcium 9.8 8.9 - 10.3 mg/dL   GFR, Estimated 47 (L) >60 mL/min   Anion gap 14 5 - 15    Comment: Performed at [facility] [address], [place], [place] [address]  Magnesium     Status: None   Collection Time: 11/18/19  4:40 AM  Result Value Ref Range   Magnesium 2.2 1.7 - 2.4 mg/dL    Comment: Performed at [facility] [address], [place], [place] [address]  TSH     Status: Abnormal   Collection Time: 11/19/19 10:40 AM  Result Value Ref Range   TSH 5.695 (H) 0.350 - 4.500 uIU/mL    Comment: Performed by a 3rd Generation assay with a functional sensitivity of <=0.01 uIU/mL. Performed at [facility], [place] [address], [place], [place] [address]    MR BRAIN WO CONTRAST  Result Date: 11/17/2019 CLINICAL DATA:  86 year old female with acute weakness, imbalance. EXAM: MRI HEAD WITHOUT CONTRAST TECHNIQUE: Multiplanar, multiecho pulse sequences of the brain and surrounding structures were obtained without intravenous contrast. COMPARISON:  Head CT earlier today. FINDINGS: Brain: Linear, patchy restricted diffusion in the right corona radiata tracking toward the external capsule (series 5, image 75 and series 7, image 62). No other restricted diffusion. Underlying Patchy and confluent bilateral cerebral white matter T2 and FLAIR hyperintensity. Similar signal heterogeneity throughout the bilateral basal ganglia, worse on the right. Occasional chronic micro  hemorrhages (left periatrial white matter series 14, image 29). No evidence of acute hemorrhage. No mass effect. No midline shift, evidence of mass lesion, ventriculomegaly, extra-axial collection. Cervicomedullary junction and pituitary are within normal limits. Mild for age signal heterogeneity in the pons. No cortical encephalomalacia identified. Vascular: Major intracranial vascular flow voids are preserved. Skull and upper cervical spine: Cervical spine detailed separately today. Normal visible bone marrow signal. Sinuses/Orbits: Negative, postoperative changes to both globes. Other: Mastoids are well pneumatized. Visible internal auditory structures appear normal. Scalp and face appear negative. IMPRESSION: 1. Acute small vessel infarct in the right corona radiata, external capsule. No associated hemorrhage or mass effect. 2. Underlying moderate to advanced chronic small vessel disease. Electronically Signed   By: Genevie Ann M.D.   On: 11/17/2019 18:44   MR Cervical Spine Wo Contrast  Result Date: 11/17/2019 CLINICAL DATA:  86 year old female with acute weakness, imbalance. EXAM: MRI CERVICAL SPINE WITHOUT CONTRAST TECHNIQUE: Multiplanar, multisequence MR imaging of the cervical spine was performed. No intravenous contrast was administered. COMPARISON:  Brain MRI today reported separately. FINDINGS: Alignment: Mild degenerative anterolisthesis of C2 on C3, C7 on T1. Overall straightening of cervical lordosis. Vertebrae: Widespread degenerative endplate marrow signal changes in the cervical spine. No marrow edema or evidence of acute osseous abnormality. Benign vertebral body hemangioma at T2 on the right. Cord: No cervical spinal cord signal abnormality despite multilevel degenerative stenosis with some cord mass effect detailed below. Visible upper thoracic cord appears normal. Posterior Fossa, vertebral arteries, paraspinal tissues: Cervicomedullary junction is within normal limits. Brain is detailed  separately today. Preserved major vascular flow voids in the neck. Thyromegaly (series 7, image 6 on the right, but no follow-up indicated in  this age group (ref: J Am Coll Radiol. 2015 Feb;12(2): 143-50). Disc levels: C2-C3: Anterolisthesis associated with moderate left and severe right facet hypertrophy. Ligament flavum hypertrophy. No spinal stenosis. Moderate to severe bilateral C3 foraminal stenosis greater on the right. C3-C4: Severe disc space loss. Circumferential disc osteophyte complex. Mild to moderate facet and ligament flavum hypertrophy. Mild spinal stenosis. Mild if any cord mass effect. Severe bilateral C4 foraminal stenosis. C4-C5: Severe disc space loss. Circumferential disc osteophyte complex. Mild to moderate posterior element hypertrophy. Mild spinal stenosis and spinal cord mass effect. Severe bilateral C5 foraminal stenosis. C5-C6: Disc space loss with circumferential disc osteophyte complex. Mild to moderate posterior element hypertrophy. No significant spinal stenosis. Moderate left and moderate to severe right C6 foraminal stenosis. C6-C7: Mild disc space loss. Circumferential disc osteophyte complex. Moderate facet and ligament flavum hypertrophy. No significant spinal stenosis. Moderate bilateral C7 foraminal stenosis. C7-T1: Mild anterolisthesis. Mild to moderate posterior element hypertrophy. No significant stenosis. IMPRESSION: 1. No acute osseous abnormality in the cervical spine. Mild degenerative spondylolisthesis at C2-C3 and C7-T1 with facet arthropathy. 2. Widespread cervical spine degeneration. Mild spinal stenosis at C3-C4 and C4-C5 with up to mild spinal cord mass effect, but no cord signal abnormality. 3. Widespread moderate and severe degenerative neural foraminal stenosis, only sparing the C8 nerve level. Electronically Signed   By: Genevie Ann M.D.   On: 11/17/2019 18:56   ECHOCARDIOGRAM COMPLETE  Result Date: 11/18/2019    ECHOCARDIOGRAM REPORT   Patient Name:   Cindy Smith Date of Exam: 11/18/2019 Medical Rec #:  [id number]            Height:       60.0 in Accession #:    [id number]           Weight:       130.3 lb Date of Birth:  November 09, 1923           BSA:          1.556 m Patient Age:    79 years             BP:           163/71 mmHg Patient Gender: F                    HR:           68 bpm. Exam Location:  Inpatient Procedure: 2D Echo, Cardiac Doppler and Color Doppler Indications:    Stroke 434.91 / I163.9  History:        Patient has no prior history of Echocardiogram examinations.                 Risk Factors:Hypertension and Non-Smoker.  Sonographer:    Vickie Epley RDCS Referring Phys: [id number] Victor  1. Left ventricular ejection fraction, by estimation, is 65 to 70%. The left ventricle has normal function. The left ventricle has no regional wall motion abnormalities. Left ventricular diastolic parameters are consistent with Grade II diastolic dysfunction (pseudonormalization). Elevated left atrial pressure.  2. Right ventricular systolic function is normal. The right ventricular size is normal.  3. Left atrial size was mild to moderately dilated.  4. Right atrial size was mildly dilated.  5. The mitral valve is normal in structure. Mild mitral valve regurgitation. No evidence of mitral stenosis. Moderate mitral annular calcification.  6. The aortic valve is normal in structure. There is moderate calcification of the aortic valve. There is moderate thickening  of the aortic valve. Aortic valve regurgitation is mild. Mild aortic valve stenosis. Aortic valve mean gradient measures 10.0 mmHg.  7. The inferior vena cava is normal in size with greater than 50% respiratory variability, suggesting right atrial pressure of 3 mmHg. Conclusion(s)/Recommendation(s): No intracardiac source of embolism detected on this transthoracic study. A transesophageal echocardiogram is recommended to exclude cardiac source of embolism if clinically indicated. FINDINGS   Left Ventricle: Left ventricular ejection fraction, by estimation, is 65 to 70%. The left ventricle has normal function. The left ventricle has no regional wall motion abnormalities. The left ventricular internal cavity size was normal in size. There is  no left ventricular hypertrophy. Left ventricular diastolic parameters are consistent with Grade II diastolic dysfunction (pseudonormalization). Elevated left atrial pressure. Right Ventricle: The right ventricular size is normal. No increase in right ventricular wall thickness. Right ventricular systolic function is normal. Left Atrium: Left atrial size was mild to moderately dilated. Right Atrium: Right atrial size was mildly dilated. Pericardium: Trivial pericardial effusion is present. Mitral Valve: The mitral valve is normal in structure. There is moderate thickening of the mitral valve leaflet(s). There is moderate calcification of the mitral valve leaflet(s). Moderate mitral annular calcification. Mild mitral valve regurgitation. No  evidence of mitral valve stenosis. Tricuspid Valve: The tricuspid valve is normal in structure. Tricuspid valve regurgitation is mild . No evidence of tricuspid stenosis. Aortic Valve: The aortic valve is normal in structure. There is moderate calcification of the aortic valve. There is moderate thickening of the aortic valve. Aortic valve regurgitation is mild. Mild aortic stenosis is present. Aortic valve mean gradient measures 10.0 mmHg. Pulmonic Valve: The pulmonic valve was normal in structure. Pulmonic valve regurgitation is not visualized. No evidence of pulmonic stenosis. Aorta: The aortic root is normal in size and structure. Venous: The inferior vena cava is normal in size with greater than 50% respiratory variability, suggesting right atrial pressure of 3 mmHg. IAS/Shunts: No atrial level shunt detected by color flow Doppler.  LEFT VENTRICLE PLAX 2D LVIDd:         5.10 cm      Diastology LVIDs:         3.60 cm      LV  e' medial:    3.50 cm/s LV PW:         0.90 cm      LV E/e' medial:  28.1 LV IVS:        0.90 cm      LV e' lateral:   6.00 cm/s LVOT diam:     2.10 cm      LV E/e' lateral: 16.4 LV SV:         69 LV SV Index:   45 LVOT Area:     3.46 cm  LV Volumes (MOD) LV vol d, MOD A2C: 102.0 ml LV vol d, MOD A4C: 87.6 ml LV vol s, MOD A2C: 47.1 ml LV vol s, MOD A4C: 46.4 ml LV SV MOD A2C:     54.9 ml LV SV MOD A4C:     87.6 ml LV SV MOD BP:      51.1 ml RIGHT VENTRICLE RV S prime:     10.70 cm/s TAPSE (M-mode): 1.4 cm LEFT ATRIUM             Index       RIGHT ATRIUM           Index LA diam:        4.50 cm 2.89  cm/m  RA Area:     11.30 cm LA Vol (A2C):   59.4 ml 38.18 ml/m RA Volume:   24.60 ml  15.81 ml/m LA Vol (A4C):   40.9 ml 26.29 ml/m LA Biplane Vol: 49.8 ml 32.01 ml/m  AORTIC VALVE AV Mean Grad: 10.0 mmHg LVOT Vmax:    94.60 cm/s LVOT Vmean:   63.500 cm/s LVOT VTI:     0.200 m  AORTA Ao Root diam: 3.00 cm MITRAL VALVE MV Area (PHT): 3.85 cm    SHUNTS MV Decel Time: 197 msec    Systemic VTI:  0.20 m MV E velocity: 98.50 cm/s  Systemic Diam: 2.10 cm MV A velocity: 71.60 cm/s MV E/A ratio:  1.38 Ena Dawley MD Electronically signed by Ena Dawley MD Signature Date/Time: 11/18/2019/1:52:00 PM    Final    CT ANGIO HEAD CODE STROKE  Result Date: 11/17/2019 CLINICAL DATA:  TIA EXAM: CT ANGIOGRAPHY HEAD AND NECK TECHNIQUE: Multidetector CT imaging of the head and neck was performed using the standard protocol during bolus administration of intravenous contrast. Multiplanar CT image reconstructions and MIPs were obtained to evaluate the vascular anatomy. Carotid stenosis measurements (when applicable) are obtained utilizing NASCET criteria, using the distal internal carotid diameter as the denominator. CONTRAST:  16mL OMNIPAQUE IOHEXOL 350 MG/ML SOLN COMPARISON:  None. FINDINGS: CTA NECK FINDINGS SKELETON: There is no bony spinal canal stenosis. No lytic or blastic lesion. OTHER NECK: Heterogeneous and  enlarged right thyroid lobe measuring 3 1 x 2.5 cm. UPPER CHEST: Biapical emphysema AORTIC ARCH: There is calcific atherosclerosis of the aortic arch. There is no aneurysm, dissection or hemodynamically significant stenosis of the visualized portion of the aorta. Conventional 3 vessel aortic branching pattern. The visualized proximal subclavian arteries are widely patent. RIGHT CAROTID SYSTEM: No dissection, occlusion or aneurysm. There is predominantly calcified atherosclerosis extending into the proximal ICA, resulting in 50% stenosis. LEFT CAROTID SYSTEM: No dissection, occlusion or aneurysm. Mild atherosclerotic calcification at the carotid bifurcation without hemodynamically significant stenosis. VERTEBRAL ARTERIES: Codominant configuration. Both origins are clearly patent. There is no dissection, occlusion or flow-limiting stenosis to the skull base (V1-V3 segments). CTA HEAD FINDINGS POSTERIOR CIRCULATION: --Vertebral arteries: Normal V4 segments. --Inferior cerebellar arteries: Normal. --Basilar artery: Normal. --Superior cerebellar arteries: Normal. --Posterior cerebral arteries (PCA): Normal. ANTERIOR CIRCULATION: --Intracranial internal carotid arteries: Atherosclerotic calcification of the internal carotid arteries at the skull base without hemodynamically significant stenosis. --Anterior cerebral arteries (ACA): Normal. Both A1 segments are present. Patent anterior communicating artery (a-comm). --Middle cerebral arteries (MCA): Normal. VENOUS SINUSES: As permitted by contrast timing, patent. ANATOMIC VARIANTS: None Review of the MIP images confirms the above findings. IMPRESSION: 1. No intracranial arterial occlusion or high-grade stenosis. 2. Approximately 50% stenosis of the proximal right internal carotid artery secondary to predominantly calcified atherosclerosis. 3. Heterogeneous and enlarged right thyroid lobe measuring 3 1 x 2.5 cm. Recommend thyroid ultrasound (ref: J Am Coll Radiol. 2015  Feb;12(2): 143-50). Aortic Atherosclerosis (ICD10-I70.0) and Emphysema (ICD10-J43.9). Electronically Signed   By: Ulyses Jarred M.D.   On: 11/17/2019 21:51   CT ANGIO NECK CODE STROKE  Result Date: 11/17/2019 CLINICAL DATA:  TIA EXAM: CT ANGIOGRAPHY HEAD AND NECK TECHNIQUE: Multidetector CT imaging of the head and neck was performed using the standard protocol during bolus administration of intravenous contrast. Multiplanar CT image reconstructions and MIPs were obtained to evaluate the vascular anatomy. Carotid stenosis measurements (when applicable) are obtained utilizing NASCET criteria, using the distal internal carotid diameter as the denominator. CONTRAST:  31mL OMNIPAQUE IOHEXOL 350 MG/ML SOLN COMPARISON:  None. FINDINGS: CTA NECK FINDINGS SKELETON: There is no bony spinal canal stenosis. No lytic or blastic lesion. OTHER NECK: Heterogeneous and enlarged right thyroid lobe measuring 3 1 x 2.5 cm. UPPER CHEST: Biapical emphysema AORTIC ARCH: There is calcific atherosclerosis of the aortic arch. There is no aneurysm, dissection or hemodynamically significant stenosis of the visualized portion of the aorta. Conventional 3 vessel aortic branching pattern. The visualized proximal subclavian arteries are widely patent. RIGHT CAROTID SYSTEM: No dissection, occlusion or aneurysm. There is predominantly calcified atherosclerosis extending into the proximal ICA, resulting in 50% stenosis. LEFT CAROTID SYSTEM: No dissection, occlusion or aneurysm. Mild atherosclerotic calcification at the carotid bifurcation without hemodynamically significant stenosis. VERTEBRAL ARTERIES: Codominant configuration. Both origins are clearly patent. There is no dissection, occlusion or flow-limiting stenosis to the skull base (V1-V3 segments). CTA HEAD FINDINGS POSTERIOR CIRCULATION: --Vertebral arteries: Normal V4 segments. --Inferior cerebellar arteries: Normal. --Basilar artery: Normal. --Superior cerebellar arteries: Normal.  --Posterior cerebral arteries (PCA): Normal. ANTERIOR CIRCULATION: --Intracranial internal carotid arteries: Atherosclerotic calcification of the internal carotid arteries at the skull base without hemodynamically significant stenosis. --Anterior cerebral arteries (ACA): Normal. Both A1 segments are present. Patent anterior communicating artery (a-comm). --Middle cerebral arteries (MCA): Normal. VENOUS SINUSES: As permitted by contrast timing, patent. ANATOMIC VARIANTS: None Review of the MIP images confirms the above findings. IMPRESSION: 1. No intracranial arterial occlusion or high-grade stenosis. 2. Approximately 50% stenosis of the proximal right internal carotid artery secondary to predominantly calcified atherosclerosis. 3. Heterogeneous and enlarged right thyroid lobe measuring 3 1 x 2.5 cm. Recommend thyroid ultrasound (ref: J Am Coll Radiol. 2015 Feb;12(2): 143-50). Aortic Atherosclerosis (ICD10-I70.0) and Emphysema (ICD10-J43.9). Electronically Signed   By: Ulyses Jarred M.D.   On: 11/17/2019 21:51       Medical Problem List and Plan: 1.  Blurred vision with gait disturbance secondary to right basal ganglia infarction.  Event monitor to be arranged for outpatient  -patient may shower  -ELOS/Goals: 10-12 days modI 2.  Antithrombotics: -DVT/anticoagulation:  SCD  -antiplatelet therapy: Aspirin 81 mg daily and Plavix 75 mg daily x3 weeks then aspirin alone 3. Pain Management: Tylenol as needed. Add kpad for upper back muscle spasms.  4. Mood: Provide emotional support  -antipsychotic agents: N/A 5. Neuropsych: This patient is capable of making decisions on her own behalf. 6. Skin/Wound Care: Routine skin checks. May discontinue IV 7. Fluids/Electrolytes/Nutrition: Routine in and outs with follow-up chemistries 8.  Hypertension.  Norvasc 10 mg daily, Tenormin 25 mg daily. Will add po Hydralazine 25mg  q8H prn for SBP>180.  Monitor with increased mobility 9.  Hyperlipidemia.  Lipitor 10.   History of colon cancer with colectomy.  Follow-up outpatient 11.  Anemia of chronic disease.  Follow-up CBC 12.  Incidental finding of nontoxic multinodular goiter.  Follow-up outpatient ultrasound  Lavon Paganini Angiulli, PA-C  I have personally performed a face to face diagnostic evaluation, including, but not limited to relevant history and physical exam findings, of this patient and developed relevant assessment and plan.  Additionally, I have reviewed and concur with the physician assistant's documentation above.  Leeroy Cha, MD

## 2019-11-20 ENCOUNTER — Inpatient Hospital Stay (HOSPITAL_COMMUNITY): Payer: Medicare Other | Admitting: Occupational Therapy

## 2019-11-20 ENCOUNTER — Inpatient Hospital Stay (HOSPITAL_COMMUNITY): Payer: Medicare Other | Admitting: Physical Therapy

## 2019-11-20 NOTE — Plan of Care (Signed)
  Problem: RH Balance Goal: LTG Patient will maintain dynamic standing with ADLs (OT) Description: LTG:  Patient will maintain dynamic standing balance with assist during activities of daily living (OT)  Flowsheets (Taken 11/20/2019 1552) LTG: Pt will maintain dynamic standing balance during ADLs with: Independent with assistive device   Problem: Sit to Stand Goal: LTG:  Patient will perform sit to stand in prep for activites of daily living with assistance level (OT) Description: LTG:  Patient will perform sit to stand in prep for activites of daily living with assistance level (OT) Flowsheets (Taken 11/20/2019 1552) LTG: PT will perform sit to stand in prep for activites of daily living with assistance level: Independent with assistive device   Problem: RH Grooming Goal: LTG Patient will perform grooming w/assist,cues/equip (OT) Description: LTG: Patient will perform grooming with assist, with/without cues using equipment (OT) Flowsheets (Taken 11/20/2019 1552) LTG: Pt will perform grooming with assistance level of: Independent with assistive device    Problem: RH Bathing Goal: LTG Patient will bathe all body parts with assist levels (OT) Description: LTG: Patient will bathe all body parts with assist levels (OT) Flowsheets (Taken 11/20/2019 1552) LTG: Pt will perform bathing with assistance level/cueing: Independent with assistive device    Problem: RH Dressing Goal: LTG Patient will perform upper body dressing (OT) Description: LTG Patient will perform upper body dressing with assist, with/without cues (OT). Flowsheets (Taken 11/20/2019 1552) LTG: Pt will perform upper body dressing with assistance level of: Independent with assistive device Goal: LTG Patient will perform lower body dressing w/assist (OT) Description: LTG: Patient will perform lower body dressing with assist, with/without cues in positioning using equipment (OT) Flowsheets (Taken 11/20/2019 1552) LTG: Pt will  perform lower body dressing with assistance level of: Independent with assistive device   Problem: RH Toileting Goal: LTG Patient will perform toileting task (3/3 steps) with assistance level (OT) Description: LTG: Patient will perform toileting task (3/3 steps) with assistance level (OT)  Flowsheets (Taken 11/20/2019 1552) LTG: Pt will perform toileting task (3/3 steps) with assistance level: Independent with assistive device   Problem: RH Toilet Transfers Goal: LTG Patient will perform toilet transfers w/assist (OT) Description: LTG: Patient will perform toilet transfers with assist, with/without cues using equipment (OT) Flowsheets (Taken 11/20/2019 1552) LTG: Pt will perform toilet transfers with assistance level of: Independent with assistive device

## 2019-11-20 NOTE — Evaluation (Signed)
Physical Therapy Assessment and Plan  Patient Details  Name: Cindy Smith MRN: [id number] Date of Birth: November 15, 1923  PT Diagnosis: Abnormal posture, Abnormality of gait, Coordination disorder, Difficulty walking and Impaired sensation Rehab Potential: Fair ELOS: 12-14 days   Today's Date: 11/20/2019 PT Individual Time: 1050-1200 PT Individual Time Calculation (min): 70 min    Hospital Problem: Principal Problem:   Infarction of right basal ganglia (HCC) Active Problems:   Pressure injury of skin   Past Medical History:  Past Medical History:  Diagnosis Date  . HTN (hypertension)   . Hyperthyroidism    Past Surgical History:  Past Surgical History:  Procedure Laterality Date  . COLECTOMY      Assessment & Plan Clinical Impression: Patient is a 86 year old right-handed female with history of hypertension as well as colon cancer with colectomy.  Per chart review lives alone independent with assistive device.  1 level home one-step to entry.  She does have family checks on her routinely.  Presented 11/17/2019 with blurred vision and gait disturbance.  CT/MRI showed acute small vessel infarct in the right corona radiata external capsule.  No associated hemorrhage.  CT angiogram of the head and neck with no intracranial occlusion or high-grade stenosis.  Incidental finding of thyroid nodule.  Echocardiogram with ejection fraction of 65 to 70% no wall motion abnormalities grade 2 diastolic dysfunction.  Admission chemistry sodium 133 glucose 108 creatinine 1.10 urinalysis negative nitrite.  Currently maintained on aspirin and Plavix for CVA prophylaxis.   Patient transferred to CIR on 11/19/2019 .   Patient currently requires min with mobility secondary to muscle weakness and muscle joint tightness, decreased cardiorespiratoy endurance, impaired timing and sequencing and decreased coordination and decreased sitting balance, decreased standing balance, hemiplegia and decreased  balance strategies.  Prior to hospitalization, patient was modified independent  with mobility and lived with Alone in a House home.  Home access is 1 with no railStairs to enter.  Patient will benefit from skilled PT intervention to maximize safe functional mobility, minimize fall risk and decrease caregiver burden for planned discharge home with intermittent assist.  Anticipate patient will benefit from follow up [facility] at discharge.  PT - End of Session Activity Tolerance: Tolerates 10 - 20 min activity with multiple rests Endurance Deficit: Yes PT Assessment Rehab Potential (ACUTE/IP ONLY): Fair PT Barriers to Discharge: Inaccessible home environment;Decreased caregiver support;Home environment access/layout;Lack of/limited family support PT Patient demonstrates impairments in the following area(s): Balance PT Transfers Functional Problem(s): Bed Mobility;Bed to Chair;Car;Furniture;Floor PT Locomotion Functional Problem(s): Ambulation;Wheelchair Mobility;Stairs PT Plan PT Intensity: Minimum of 1-2 x/day ,45 to 90 minutes PT Frequency: 5 out of 7 days PT Duration Estimated Length of Stay: 12-14 days PT Treatment/Interventions: Ambulation/gait training;Psychosocial support;Functional mobility training;Discharge planning;Therapeutic Activities;Visual/perceptual remediation/compensation;Wheelchair propulsion/positioning;Skin care/wound management;Therapeutic Exercise;Neuromuscular re-education;Balance/vestibular training;Disease management/prevention;Cognitive remediation/compensation;DME/adaptive equipment instruction;Pain management;Splinting/orthotics;UE/LE Strength taining/ROM;Community reintegration;Functional electrical stimulation;Patient/family education;UE/LE Arts development officer PT Transfers Anticipated Outcome(s): Mod I with LRAD PT Locomotion Anticipated Outcome(s): Mod I ambulation at house hold level PT Recommendation Recommendations for Other Services: Therapeutic  Recreation consult Follow Up Recommendations: Home health PT Patient destination: Home Equipment Recommended: To be determined   PT Evaluation Precautions/Restrictions   General   Vital SignsTherapy Vitals Pulse Rate: (!) 55 Resp: 17 BP: (!) 163/57 Patient Position (if appropriate): Sitting Oxygen Therapy SpO2: 98 % O2 Device: Room Air Pain   denies Home Living/Prior Functioning Home Living Available Help at Discharge: Family;Available PRN/intermittently;Neighbor Type of Home: House Home Access: Stairs to enter [employer] of Steps: 1  with no rail Home Layout: One level Bathroom Shower/Tub: Tub/shower unit (tub shower in both bathrooms, pt does not use them) Bathroom Toilet: Standard Bathroom Accessibility: No Additional Comments: Has 2 full bathrooms but neither are rollator accessible  Lives With: Alone Prior Function Level of Independence: Requires assistive device for independence;Independent with basic ADLs;Independent with homemaking with ambulation Driving: No Vision/Perception    mild blurring vision. Mild apraxia with LLE movement.  Cognition   [facility]  Sensation Sensation Light Touch: Impaired Detail Light Touch Impaired Details: Impaired RLE;Impaired LLE Additional Comments: able to detect all stimuli, reports baseline neuropathy Coordination Gross Motor Movements are Fluid and Coordinated: No Fine Motor Movements are Fluid and Coordinated: Yes Coordination and Movement Description: mild coordinaiton deficits on the L Finger Nose Finger Test: WNL bilaterally Motor  Motor Motor: Other (comment) Motor - Skilled Clinical Observations: Affected by hx ligamentous + ankle injuries to the Lt LE, altering pts balance in stance during self care completion and during functional transfers   Trunk/Postural Assessment  Cervical Assessment Cervical Assessment: Exceptions to [facility] (forward head) Thoracic Assessment Thoracic Assessment: Exceptions to [facility]  (rounded shoulders and  scoliosis) Lumbar Assessment Lumbar Assessment: Exceptions to [facility] (posterior pelvic tilt) Postural Control Postural Control: Deficits on evaluation  Balance Balance Balance Assessed: Yes Dynamic Sitting Balance Dynamic Sitting - Level of Assistance: 5: Stand by assistance Sitting balance - Comments: supervision assist Static Standing Balance Static Standing - Balance Support: Bilateral upper extremity supported Static Standing - Level of Assistance: 5: Stand by assistance;4: Min assist Dynamic Standing Balance Dynamic Standing - Level of Assistance: 4: Min assist;3: Mod assist Extremity Assessment  RUE Assessment RUE Assessment: Within Functional Limits Active Range of Motion (AROM) Comments: WNL General Strength Comments: 4-/5 grossly LUE Assessment LUE Assessment: Within Functional Limits Active Range of Motion (AROM) Comments: WNL General Strength Comments: 4-/5 grossly      Care Tool Care Tool Bed Mobility Roll left and right activity   Roll left and right assist level: Minimal Assistance - Patient > 75%    Sit to lying activity   Sit to lying assist level: Minimal Assistance - Patient > 75%    Lying to sitting edge of bed activity   Lying to sitting edge of bed assist level: Minimal Assistance - Patient > 75%     Care Tool Transfers Sit to stand transfer   Sit to stand assist level: Minimal Assistance - Patient > 75%    Chair/bed transfer   Chair/bed transfer assist level: Minimal Assistance - Patient > 75%     Physiological scientist transfer assist level: Moderate Assistance - Patient 50 - 74%      Care Tool Locomotion Ambulation   Assist level: Minimal Assistance - Patient > 75% Assistive device: Walker-rolling Max distance: 20  Walk 10 feet activity   Assist level: Minimal Assistance - Patient > 75% Assistive device: Walker-rolling   Walk 50 feet with 2 turns activity Walk 50 feet with 2 turns activity  did not occur: Safety/medical concerns      Walk 150 feet activity Walk 150 feet activity did not occur: Safety/medical concerns      Walk 10 feet on uneven surfaces activity Walk 10 feet on uneven surfaces activity did not occur: Safety/medical concerns      Stairs Stair activity did not occur: Safety/medical concerns        Walk up/down 1 step activity Walk up/down 1 step or  curb (drop down) activity did not occur: Safety/medical concerns     Walk up/down 4 steps activity did not occuR: Safety/medical concerns  Walk up/down 4 steps activity      Walk up/down 12 steps activity Walk up/down 12 steps activity did not occur: Safety/medical concerns      Pick up small objects from floor Pick up small object from the floor (from standing position) activity did not occur: Safety/medical concerns      Wheelchair   Type of Wheelchair: Manual   Wheelchair assist level: Minimal Assistance - Patient > 75% Max wheelchair distance: 150  Wheel 50 feet with 2 turns activity   Assist Level: Minimal Assistance - Patient > 75%  Wheel 150 feet activity   Assist Level: Minimal Assistance - Patient > 75%    Refer to Care Plan for Long Term Goals  SHORT TERM GOAL WEEK 1 PT Short Term Goal 1 (Week 1): Pt will transfer to and from [facility] with CGA. PT Short Term Goal 2 (Week 1): Pt will ambulate 52ft with CGA and RW PT Short Term Goal 3 (Week 1): Pt will perform bed mobility with supervision assist  Recommendations for other services: Therapeutic Recreation  Stress management and Outing/community reintegration  Skilled Therapeutic Intervention  Pt received supine in bed and agreeable to PT. Supine>sit transfer with min assist and cues for use of bed features as needed. PT instructed patient in PT Evaluation and initiated treatment intervention; see above for results. PT educated patient in [place], rehab potential, rehab goals, and discharge recommendations. BP assessment:  Supine: 173/58, HR  69 Sitting 164/68 HR 64  Standing 168/62, HR 61.   Car transfer training with min assist overall for safety and then to lift LLE into car. Gait training with min assist x 70ft as listed as well as min assist for WC propulsion to prevent veer to the R. Patient returned to room and left sitting in [facility] with call bell in reach and all needs met.      Mobility Bed Mobility Bed Mobility: Rolling Right;Rolling Left;Sit to Supine;Supine to Sit Rolling Right: Minimal Assistance - Patient > 75% Rolling Left: Minimal Assistance - Patient > 75% Supine to Sit: Minimal Assistance - Patient > 75% Sit to Supine: Minimal Assistance - Patient > 75% Transfers Transfers: Sit to Stand;Stand Pivot Transfers Sit to Stand: Minimal Assistance - Patient > 75% Stand Pivot Transfers: Minimal Assistance - Patient > 75% Stand Pivot Transfer Details (indicate cue type and reason): cues for increased step and width on the L. Locomotion  Gait Ambulation: Yes Gait Assistance: Minimal Assistance - Patient > 75% Gait Distance (Feet): 20 Feet Assistive device: Rolling walker Gait Assistance Details: Verbal cues for sequencing;Verbal cues for technique Gait Assistance Details: cues to step width on the R due to severe L genuvalgus Gait Gait: Yes Gait Pattern: Impaired Gait Pattern: Lateral hip instability;Poor foot clearance - right;Narrow base of support Stairs / Additional Locomotion Stairs: No Wheelchair Mobility Wheelchair Mobility: Yes Wheelchair Assistance: Minimal assistance - Patient >75% Wheelchair Propulsion: Both upper extremities Wheelchair Parts Management: Needs assistance Distance: 150   Discharge Criteria: Patient will be discharged from PT if patient refuses treatment 3 consecutive times without medical reason, if treatment goals not met, if there is a change in medical status, if patient makes no progress towards goals or if patient is discharged from hospital.  The above assessment, treatment  plan, treatment alternatives and goals were discussed and mutually agreed upon: by patient  Linus Salmons  Berline Lopes 11/20/2019, 5:13 PM

## 2019-11-20 NOTE — Evaluation (Signed)
Occupational Therapy Assessment and Plan  Patient Details  Name: Cindy Smith MRN: [id number] Date of Birth: [birth year]  OT Diagnosis: abnormal posture, disturbance of vision and muscle weakness (generalized) Rehab Potential: Rehab Potential (ACUTE ONLY): Good ELOS: 12-14 days   Today's Date: 11/20/2019 OT Individual Time: 9147-8295 and 1345-1430 OT Individual Time Calculation (min): 76 min  And 45 min  Hospital Problem: Principal Problem:   Infarction of right basal ganglia (HCC) Active Problems:   Pressure injury of skin   Past Medical History:  Past Medical History:  Diagnosis Date  . HTN (hypertension)   . Hyperthyroidism    Past Surgical History:  Past Surgical History:  Procedure Laterality Date  . COLECTOMY      Assessment & Plan Clinical Impression: Cindy Smith is a 86 year old right-handed female with history of hypertension as well as colon cancer with colectomy.  Per chart review lives alone independent with assistive device.  1 level home one-step to entry.  She does have family checks on her routinely.  Presented 11/17/2019 with blurred vision and gait disturbance.  CT/MRI showed acute small vessel infarct in the right corona radiata external capsule.  No associated hemorrhage.  CT angiogram of the head and neck with no intracranial occlusion or high-grade stenosis.  Incidental finding of thyroid nodule.  Echocardiogram with ejection fraction of 65 to 70% no wall motion abnormalities grade 2 diastolic dysfunction.  Admission chemistry sodium 133 glucose 108 creatinine 1.10 urinalysis negative nitrite.  Currently maintained on aspirin and Plavix for CVA prophylaxis.  Tolerating a regular diet.  Therapy evaluations completed and patient was admitted for a comprehensive rehab program.   Patient currently requires mod with basic self-care skills secondary to muscle weakness and neuropathy, decreased cardiorespiratoy endurance and decreased standing balance  and decreased balance strategies.  Prior to hospitalization, patient could complete BADLs with modified independent .  Patient will benefit from skilled intervention to increase independence with basic self-care skills prior to discharge home independently.  Anticipate patient will require intermittent supervision and follow up home health.  OT - End of Session Endurance Deficit: Yes Endurance Deficit Description: Pt unable to complete perihygiene in standing due to fatigue, therefore completed while sitting using lateral leans OT Assessment Rehab Potential (ACUTE ONLY): Good OT Barriers to Discharge: Decreased caregiver support;Home environment access/layout;Incontinence OT Patient demonstrates impairments in the following area(s): Balance;Motor;Sensory;Skin Integrity;Vision;Safety;Endurance OT Basic ADL's Functional Problem(s): Grooming;Bathing;Dressing;Toileting OT Advanced ADL's Functional Problem(s): Laundry;Simple Meal Preparation OT Transfers Functional Problem(s): Toilet OT Additional Impairment(s): None OT Plan OT Intensity: Minimum of 1-2 x/day, 45 to 90 minutes OT Frequency: 5 out of 7 days OT Duration/Estimated Length of Stay: 12-14 days OT Treatment/Interventions: Balance/vestibular training;Community reintegration;Disease mangement/prevention;Neuromuscular re-education;Patient/family education;Self Care/advanced ADL retraining;Therapeutic Exercise;UE/LE Coordination activities;Wheelchair propulsion/positioning;Visual/perceptual remediation/compensation;UE/LE Strength taining/ROM;Therapeutic Activities;Psychosocial support;Pain management;Functional mobility training;DME/adaptive equipment instruction;Discharge planning OT Self Feeding Anticipated Outcome(s): No goal OT Basic Self-Care Anticipated Outcome(s): Mod I OT Toileting Anticipated Outcome(s): Mod I OT Bathroom Transfers Anticipated Outcome(s): Mod I OT Recommendation Recommendations for Other Services: Therapeutic  Recreation consult Therapeutic Recreation Interventions: [place] group Patient destination: Home Follow Up Recommendations: Home health OT Equipment Recommended: To be determined   OT Evaluation Precautions/Restrictions  Precautions Precautions: Fall Precaution Comments: chronic Lt knee injury, knee brace when OOB Restrictions Weight Bearing Restrictions: No Home Living/Prior Functioning Home Living Family/patient expects to be discharged to:: Private residence Living Arrangements: Alone Available Help at Discharge: Family, Available PRN/intermittently, Neighbor Type of Home: House Home Access: Stairs to enter [employer] of Steps: 1 with  no rail Entrance Stairs-Rails: None Home Layout: One level Bathroom Shower/Tub: Tub/shower unit (tub shower in both bathrooms, pt does not use them) Biochemist, clinical: Standard Bathroom Accessibility: No Additional Comments: Has 2 full bathrooms but neither are rollator accessible  Lives With: Alone IADL History Homemaking Responsibilities: Yes Meal Prep Responsibility: Primary Laundry Responsibility: Primary Cleaning Responsibility: No Shopping Responsibility: No Homemaking Comments: Pt reports her granddaughter assists with driving needs, i.e. 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 reads, cleans as able, does some meal prep Vision Baseline Vision/History: Wears glasses Wears Glasses: Reading only Patient Visual Report: Blurring of vision Vision Assessment?: Vision impaired- to be further  tested in functional context (pt reports she is unable to visually discriminate her meal items) Perception  Perception: Within Functional Limits Praxis Praxis: Intact Cognition Overall Cognitive Status: Within Functional Limits for tasks assessed Arousal/Alertness: Awake/alert Orientation Level: Person;Place;Situation Person: Oriented Place: Oriented Situation: Oriented Year: 2021 Month: October Day of Week: Correct Memory: Appears intact Immediate Memory Recall: Sock;Blue;Bed Memory Recall Sock: Without Cue Memory Recall Blue: Without Cue Memory Recall Bed: Without Cue Problem Solving: Appears intact Safety/Judgment: Appears intact Sensation Sensation Light Touch: Impaired Detail (impaired in all 4 extremities, LEs>UEs, per pt report neuropathy worsened since hospital admission) Coordination Gross Motor Movements are Fluid and Coordinated: No Fine Motor Movements are Fluid and Coordinated: Yes Finger Nose Finger Test: WNL bilaterally Motor  Motor Motor: Other (comment) Motor - Skilled Clinical Observations: Affected by hx ligamentous + ankle injuries to the Lt LE, altering pts balance in stance during self care completion and during functional transfers  Trunk/Postural Assessment  Cervical Assessment Cervical Assessment: Exceptions to [facility] (forward head) Thoracic Assessment Thoracic Assessment: Exceptions to [facility] (rounded shoulders) Lumbar Assessment Lumbar Assessment: Exceptions to [facility] (posterior pelvic tilt) Postural Control Postural Control: Within Functional Limits  Balance Balance Balance Assessed: Yes Dynamic Sitting Balance Dynamic Sitting - Level of Assistance: 5: Stand by assistance (washing feet EOB) Dynamic Standing Balance Dynamic Standing - Level of Assistance: 3: Mod assist (stand pivot BSC transfer using RW) Extremity/Trunk Assessment RUE Assessment RUE Assessment: Within Functional Limits Active Range of Motion (AROM) Comments: WNL General Strength  Comments: 4-/5 grossly LUE Assessment LUE Assessment: Within Functional Limits Active Range of Motion (AROM) Comments: WNL General Strength Comments: 4-/5 grossly  Care Tool Care Tool Self Care Eating    setup assist    Oral Care    Oral Care Assist Level: Minimal Assistance - Patient > 75%    Bathing   Body parts bathed by patient: Right arm;Left arm;Chest;Abdomen;Front perineal area;Face;Left lower leg;Right lower leg;Left upper leg;Right upper leg Body parts bathed by helper: Buttocks   Assist Level: Minimal Assistance - Patient > 75%    Upper Body Dressing(including orthotics)   What is the patient wearing?: Pull over shirt;Bra   Assist Level: Set up assist    Lower Body Dressing (excluding footwear)   What is the patient wearing?: Incontinence brief;Pants Assist for lower body dressing: Maximal Assistance - Patient 25 - 49%    Putting on/Taking off footwear   What is the patient wearing?: Non-skid slipper socks Assist for footwear: Minimal Assistance -  Patient > 75%       Care Tool Toileting Toileting activity   Assist for toileting: Maximal Assistance - Patient 25 - 49%       Refer to Care Plan for Long Term Goals  SHORT TERM GOAL WEEK 1 OT Short Term Goal 1 (Week 1): Pt will don pants with Mod A using AE as needed OT Short Term Goal 2 (Week 1): Pt will complete 1/3 components of toileting with no more than Min balance assistance OT Short Term Goal 3 (Week 1): Pt will complete toilet transfer with Min A using LRAD  Recommendations for other services: Surveyor, mining group   Skilled Therapeutic Intervention Skilled OT session completed with focus on initial evaluation, education on OT role/POC, and establishment of patient-centered goals.   Pt greeted in bed, finishing up breakfast with no c/o pain. Supine<sit completed with supervision with HOB elevated and then she engaged in bathing/dressing tasks sit<stand from EOB using the RW for  standing support. Pt required setup for UB given increased time, increased time also for washing feet using figure 4 position. Min A for sit<stand using RW, note that when pt stands her Lt knee is turned inwards, per pt, this is baseline s/p ligamentous + ankle injuries PTA. She wears a knee brace over her pants at home. Mod A for stand pivot<BSC using the RW, pt taking small shuffling steps with Lt knee internally rotated, affecting position of Lt foot in stance. She required Min A for hygiene thoroughness when sitting on the BSC using lateral leans, requiring increased time due to sensation deficits "in the saddle" preventing her from knowing when she was finished voiding. Max A for LB dressing due to time constraints. When pt stood to elevate pants, note that she was having another BM. Left her in care of RN for hygiene and morning medication. Notified RN of pts transfer status and to use the knee brace for transfer back to bed.   2nd Session 1:1 tx (45 min) Pt greeted in the recliner with no c/o pain. Daughter, Lattie Haw, present to observe and ask appropriate questions regarding rehab process and ELOS. Pt engaged in oral care and other grooming tasks while sitting at the sink. Min A for opening toothpaste cap with pt reporting both hands are weaker s/p CVA. She was able to use both UEs to floss her teeth and apply barets to hair after brushing. Setup for hand washing and face washing. At end of session pt remained sitting up in the recliner, left with all needs within reach.   ADL ADL Eating: Set up Where Assessed-Eating: Bed level Grooming: Minimal assistance Where Assessed-Grooming: Sitting at sink Upper Body Bathing: Setup Where Assessed-Upper Body Bathing: Edge of bed Lower Body Bathing: Minimal assistance Where Assessed-Lower Body Bathing: Edge of bed Upper Body Dressing: Setup Where Assessed-Upper Body Dressing: Other (Comment) (sitting on BSc) Lower Body Dressing: Maximal assistance Where  Assessed-Lower Body Dressing: Other (Comment) (sitting on BSC) Toileting: Maximal assistance Where Assessed-Toileting: Bedside Commode Toilet Transfer: Moderate assistance Toilet Transfer Method: Stand pivot (RW) Toilet Transfer Equipment: Radiographer, therapeutic: Not assessed Mobility   Mod A stand pivot BSC transfer using RW   Discharge Criteria: Patient will be discharged from OT if patient refuses treatment 3 consecutive times without medical reason, if treatment goals not met, if there is a change in medical status, if patient makes no progress towards goals or if patient is discharged from hospital.  The above assessment, treatment plan, treatment  alternatives and goals were discussed and mutually agreed upon: by patient  Skeet Simmer 11/20/2019, 3:50 PM

## 2019-11-20 NOTE — Plan of Care (Signed)
  Problem: RH Balance Goal: LTG Patient will maintain dynamic sitting balance (PT) Description: LTG:  Patient will maintain dynamic sitting balance with assistance during mobility activities (PT) Flowsheets (Taken 11/20/2019 1635) LTG: Pt will maintain dynamic sitting balance during mobility activities with:: Independent with assistive device  Goal: LTG Patient will maintain dynamic standing balance (PT) Description: LTG:  Patient will maintain dynamic standing balance with assistance during mobility activities (PT) Flowsheets (Taken 11/20/2019 1635) LTG: Pt will maintain dynamic standing balance during mobility activities with:: Independent with assistive device    Problem: RH Bed Mobility Goal: LTG Patient will perform bed mobility with assist (PT) Description: LTG: Patient will perform bed mobility with assistance, with/without cues (PT). Flowsheets (Taken 11/20/2019 1635) LTG: Pt will perform bed mobility with assistance level of: Independent with assistive device    Problem: RH Bed to Chair Transfers Goal: LTG Patient will perform bed/chair transfers w/assist (PT) Description: LTG: Patient will perform bed to chair transfers with assistance (PT). Flowsheets (Taken 11/20/2019 1635) LTG: Pt will perform Bed to Chair Transfers with assistance level: Independent with assistive device    Problem: RH Car Transfers Goal: LTG Patient will perform car transfers with assist (PT) Description: LTG: Patient will perform car transfers with assistance (PT). Flowsheets (Taken 11/20/2019 1635) LTG: Pt will perform car transfers with assist:: Minimal Assistance - Patient > 75%   Problem: RH Furniture Transfers Goal: LTG Patient will perform furniture transfers w/assist (OT/PT) Description: LTG: Patient will perform furniture transfers  with assistance (OT/PT). Flowsheets (Taken 11/20/2019 1635) LTG: Pt will perform furniture transfers with assist:: Independent with assistive device    Problem:  RH Ambulation Goal: LTG Patient will ambulate in controlled environment (PT) Description: LTG: Patient will ambulate in a controlled environment, # of feet with assistance (PT). Flowsheets (Taken 11/20/2019 1635) LTG: Ambulation distance in controlled environment: 22ft with LRAD Goal: LTG Patient will ambulate in home environment (PT) Description: LTG: Patient will ambulate in home environment, # of feet with assistance (PT). Flowsheets (Taken 11/20/2019 1635) LTG: Pt will ambulate in home environ  assist needed:: Independent with assistive device LTG: Ambulation distance in home environment: 20ft with LRAD   Problem: RH Wheelchair Mobility Goal: LTG Patient will propel w/c in controlled environment (PT) Description: LTG: Patient will propel wheelchair in controlled environment, # of feet with assist (PT) Flowsheets (Taken 11/20/2019 1635) LTG: Pt will propel w/c in controlled environ  assist needed:: Independent with assistive device LTG: Propel w/c distance in controlled environment: 167ft Goal: LTG Patient will propel w/c in home environment (PT) Description: LTG: Patient will propel wheelchair in home environment, # of feet with assistance (PT). Flowsheets (Taken 11/20/2019 1635) LTG: Pt will propel w/c in home environ  assist needed:: Independent with assistive device Distance: wheelchair distance in controlled environment: 50   Problem: RH Stairs Goal: LTG Patient will ambulate up and down stairs w/assist (PT) Description: LTG: Patient will ambulate up and down # of stairs with assistance (PT) Flowsheets (Taken 11/20/2019 1635) LTG: Pt will ambulate up/down stairs assist needed:: Minimal Assistance - Patient > 75% LTG: Pt will  ambulate up and down number of stairs: 1 3 inch step to porch to access home

## 2019-11-21 DIAGNOSIS — I639 Cerebral infarction, unspecified: Secondary | ICD-10-CM

## 2019-11-21 NOTE — Progress Notes (Signed)
[facility] [facility] PHYSICAL MEDICINE & REHABILITATION PROGRESS NOTE   Subjective/Complaints:  No pains, PT caused a "good tired" yesterday . No dizziness  Pt with chronic urinary and bowel incont, with q 2hr toileting at home following radiation therapy for colon ca years ago Chronic left knee injury , needed surgery but not done due to Covid pandemic ROS- neg CP, SOB, N/V/D  Objective:   No results found. No results for input(s): WBC, HGB, HCT, PLT in the last 72 hours. No results for input(s): NA, K, CL, CO2, GLUCOSE, BUN, CREATININE, CALCIUM in the last 72 hours. No intake or output data in the 24 hours ending 11/21/19 0918   Pressure Injury 11/19/19 Coccyx Bilateral Stage 1 -  Intact skin with non-blanchable redness of a localized area usually over a bony prominence. Non blanchable,red flaky skin, bilaterally (Active)  11/19/19 1456  Location: Coccyx  Location Orientation: Bilateral  Staging: Stage 1 -  Intact skin with non-blanchable redness of a localized area usually over a bony prominence.  Wound Description (Comments): Non blanchable,red flaky skin, bilaterally  Present on Admission: Yes    Physical Exam: Vital Signs Blood pressure (!) 148/54, pulse (!) 53, temperature 98.1 F (36.7 C), temperature source Oral, resp. rate 16, height 5' (1.524 m), weight 58.9 kg, SpO2 96 %.   General: No acute distress Mood and affect are appropriate Heart: Regular rate and rhythm no rubs murmurs or extra sounds Lungs: Clear to auscultation, breathing unlabored, no rales or wheezes Abdomen: Positive bowel sounds, soft nontender to palpation, nondistended Extremities: No clubbing, cyanosis, or edema Skin: No evidence of breakdown, no evidence of rash Neurologic: Cranial nerves II through XII intact, motor strength is 5/5 in right and 4/5 left deltoid, bicep, tricep, grip, hip flexor, knee extensors, ankle dorsiflexor and plantar flexor Sensory exam normal sensation to light touch and  proprioception in bilateral upper and lower extremities Cerebellar exam normal finger to nose to finger as well as heel to shin in bilateral upper and lower extremities Musculoskeletal: Full range of motion in all 4 extremities. No joint swelling  Assessment/Plan: 1. Functional deficits secondary to Right corona radiata infarct which require 3+ hours per day of interdisciplinary therapy in a comprehensive inpatient rehab setting.  Physiatrist is providing close team supervision and 24 hour management of active medical problems listed below.  Physiatrist and rehab team continue to assess barriers to discharge/monitor patient progress toward functional and medical goals  Care Tool:  Bathing    Body parts bathed by patient: Right arm, Left arm, Chest, Abdomen, Front perineal area, Face, Left lower leg, Right lower leg, Left upper leg, Right upper leg   Body parts bathed by helper: Buttocks     Bathing assist Assist Level: Minimal Assistance - Patient > 75%     Upper Body Dressing/Undressing Upper body dressing   What is the patient wearing?: Hospital gown only    Upper body assist Assist Level: Minimal Assistance - Patient > 75%    Lower Body Dressing/Undressing Lower body dressing      What is the patient wearing?: Incontinence brief     Lower body assist Assist for lower body dressing: Maximal Assistance - Patient 25 - 49%     Toileting Toileting    Toileting assist Assist for toileting: Maximal Assistance - Patient 25 - 49%     Transfers Chair/bed transfer  Transfers assist     Chair/bed transfer assist level: Moderate Assistance - Patient 50 - 74%     Locomotion Ambulation  Ambulation assist      Assist level: Minimal Assistance - Patient > 75% Assistive device: Walker-rolling Max distance: 20   Walk 10 feet activity   Assist     Assist level: Minimal Assistance - Patient > 75% Assistive device: Walker-rolling   Walk 50 feet  activity   Assist Walk 50 feet with 2 turns activity did not occur: Safety/medical concerns         Walk 150 feet activity   Assist Walk 150 feet activity did not occur: Safety/medical concerns         Walk 10 feet on uneven surface  activity   Assist Walk 10 feet on uneven surfaces activity did not occur: Safety/medical concerns         Wheelchair     Assist   Type of Wheelchair: Manual    Wheelchair assist level: Minimal Assistance - Patient > 75% Max wheelchair distance: 150    Wheelchair 50 feet with 2 turns activity    Assist        Assist Level: Minimal Assistance - Patient > 75%   Wheelchair 150 feet activity     Assist      Assist Level: Minimal Assistance - Patient > 75%   Blood pressure (!) 148/54, pulse (!) 53, temperature 98.1 F (36.7 C), temperature source Oral, resp. rate 16, height 5' (1.524 m), weight 58.9 kg, SpO2 96 %.  Medical Problem List and Plan: 1.  Blurred vision with gait disturbance secondary to right Corona radiata infarction.  Event monitor to be arranged for outpatient             -patient may shower             -ELOS/Goals: 10-12 days modI 2.  Antithrombotics: -DVT/anticoagulation:  SCD             -antiplatelet therapy: Aspirin 81 mg daily and Plavix 75 mg daily x3 weeks then aspirin alone 3. Pain Management: Tylenol as needed. Add kpad for upper back muscle spasms.  4. Mood: Provide emotional support             -antipsychotic agents: N/A 5. Neuropsych: This patient is capable of making decisions on her own behalf. 6. Skin/Wound Care: Routine skin checks. May discontinue IV Recorded intake low , check CMET in am  7. Fluids/Electrolytes/Nutrition: Routine in and outs with follow-up chemistries 8.  Hypertension.  Norvasc 10 mg daily, Tenormin 25 mg daily. Will add po Hydralazine 25mg  q8H prn for SBP>180.  Monitor with increased mobility 9.  Hyperlipidemia.  Lipitor 10.  History of colon cancer with  colectomy.  Follow-up outpatient 11.  Anemia of chronic disease.  Follow-up CBC 12.  Incidental finding of nontoxic multinodular goiter.  Follow-up outpatient ultrasound    LOS: 2 days A FACE TO FACE EVALUATION WAS PERFORMED  Charlett Blake 11/21/2019, 9:18 AM

## 2019-11-22 ENCOUNTER — Inpatient Hospital Stay (HOSPITAL_COMMUNITY): Payer: Medicare Other

## 2019-11-22 ENCOUNTER — Inpatient Hospital Stay (HOSPITAL_COMMUNITY): Payer: Medicare Other | Admitting: Occupational Therapy

## 2019-11-22 DIAGNOSIS — I10 Essential (primary) hypertension: Secondary | ICD-10-CM

## 2019-11-22 DIAGNOSIS — M1712 Unilateral primary osteoarthritis, left knee: Secondary | ICD-10-CM

## 2019-11-22 DIAGNOSIS — R7989 Other specified abnormal findings of blood chemistry: Secondary | ICD-10-CM

## 2019-11-22 LAB — COMPREHENSIVE METABOLIC PANEL
ALT: 12 U/L (ref 0–44)
AST: 13 U/L — ABNORMAL LOW (ref 15–41)
Albumin: 3.4 g/dL — ABNORMAL LOW (ref 3.5–5.0)
Alkaline Phosphatase: 55 U/L (ref 38–126)
Anion gap: 9 (ref 5–15)
BUN: 34 mg/dL — ABNORMAL HIGH (ref 8–23)
CO2: 25 mmol/L (ref 22–32)
Calcium: 9.5 mg/dL (ref 8.9–10.3)
Chloride: 104 mmol/L (ref 98–111)
Creatinine, Ser: 1.45 mg/dL — ABNORMAL HIGH (ref 0.44–1.00)
GFR, Estimated: 31 mL/min — ABNORMAL LOW (ref >60)
Glucose, Bld: 97 mg/dL (ref 70–99)
Potassium: 4.2 mmol/L (ref 3.5–5.1)
Sodium: 138 mmol/L (ref 135–145)
Total Bilirubin: 0.4 mg/dL (ref 0.3–1.2)
Total Protein: 6.6 g/dL (ref 6.5–8.1)

## 2019-11-22 LAB — CBC WITH DIFFERENTIAL/PLATELET
Abs Immature Granulocytes: 0.03 10*3/uL (ref 0.00–0.07)
Basophils Absolute: 0 10*3/uL (ref 0.0–0.1)
Basophils Relative: 1 %
Eosinophils Absolute: 0.1 10*3/uL (ref 0.0–0.5)
Eosinophils Relative: 2 %
HCT: 34.2 % — ABNORMAL LOW (ref 36.0–46.0)
Hemoglobin: 11 g/dL — ABNORMAL LOW (ref 12.0–15.0)
Immature Granulocytes: 1 %
Lymphocytes Relative: 16 %
Lymphs Abs: 1 10*3/uL (ref 0.7–4.0)
MCH: 30.2 pg (ref 26.0–34.0)
MCHC: 32.2 g/dL (ref 30.0–36.0)
MCV: 94 fL (ref 80.0–100.0)
Monocytes Absolute: 0.5 10*3/uL (ref 0.1–1.0)
Monocytes Relative: 8 %
Neutro Abs: 4.6 10*3/uL (ref 1.7–7.7)
Neutrophils Relative %: 72 %
Platelets: 251 10*3/uL (ref 150–400)
RBC: 3.64 MIL/uL — ABNORMAL LOW (ref 3.87–5.11)
RDW: 13.7 % (ref 11.5–15.5)
WBC: 6.2 10*3/uL (ref 4.0–10.5)
nRBC: 0 % (ref 0.0–0.2)

## 2019-11-22 MED ORDER — SACCHAROMYCES BOULARDII 250 MG PO CAPS
250.0000 mg | ORAL_CAPSULE | Freq: Two times a day (BID) | ORAL | Status: DC
  Administered 2019-11-22 – 2019-12-01 (×18): 250 mg via ORAL
  Filled 2019-11-22 (×18): qty 1

## 2019-11-22 NOTE — Progress Notes (Signed)
Signed    Expand All Collapse All           Physical Medicine and Rehabilitation Consult Reason for Consult: Decreased functional ability with gait disturbance Referring Physician: [facility]     HPI: Cindy Smith is a 86 y.o. right-handed female with history of hypertension as well as colon cancer with colectomy.  Per chart review patient lives alone.  Independent with assistive device.  1 level home one-step to entry.  She does have family to check on her routinely.  Presented 11/17/2019 with blurred vision and gait disturbance.  CT/MRI showed acute small vessel infarct in the right corona radiata external capsule.  No associated hemorrhage.  CT angiogram of head and neck with no intracranial occlusion or high-grade stenosis.  Echocardiogram pending.  Admission chemistry sodium 133 glucose 108 creatinine 1.10, urinalysis negative nitrite.  Currently maintained on aspirin and Plavix for CVA prophylaxis.  Therapy evaluation completed with recommendations of physical medicine rehab consult.     Review of Systems  HENT: Positive for hearing loss.   Eyes: Positive for blurred vision.  Respiratory: Negative for shortness of breath.   Cardiovascular: Negative for chest pain, palpitations and leg swelling.  Gastrointestinal: Positive for constipation. Negative for heartburn, nausea and vomiting.  Genitourinary: Negative for flank pain and hematuria.  Musculoskeletal: Positive for joint pain and myalgias.  Skin: Negative for rash.  Neurological: Positive for weakness.  All other systems reviewed and are negative.       Past Medical History:  Diagnosis Date  . HTN (hypertension)           Past Surgical History:  Procedure Laterality Date  . COLECTOMY        History reviewed. No pertinent family history. Social History:  reports that she has never smoked. She has never used smokeless tobacco. She reports that she does not drink alcohol and does not use drugs. Allergies:         Allergies  Allergen Reactions  . Codeine        "I just pass out"  . Other Other (See Comments)      Steroid (Lotemax eye drops)          Medications Prior to Admission  Medication Sig Dispense Refill  . atenolol (TENORMIN) 25 MG tablet Take 25 mg by mouth daily.      . cyanocobalamin 1000 MCG tablet Take 1,000 mcg by mouth daily.      Marland Kitchen latanoprost (XALATAN) 0.005 % ophthalmic solution Place 1 drop into both eyes every evening.      Marland Kitchen VITAMIN D, CHOLECALCIFEROL, PO Take 1 tablet by mouth daily.          Home: Home Living Family/patient expects to be discharged to:: Private residence Living Arrangements: Alone Available Help at Discharge: Family, Available PRN/intermittently, Neighbor, Other (Comment) (grandchildren) Type of Home: House Home Access: Stairs to enter [employer] of Steps: 1 with no rail Entrance Stairs-Rails: None Home Layout: One level Bathroom Shower/Tub: Other (comment) (takes birdbaths at the sink) Home Equipment: Environmental consultant - 4 wheels Additional Comments: has had neuropathy for awhile, about 4 yars ago she hurt her knee and never got it evaluated  Lives With: Alone  Functional History: Prior Function Level of Independence: Independent with assistive device(s) Functional Status:  Mobility: Bed Mobility Overal bed mobility: Needs Assistance Bed Mobility: Supine to Sit Supine to sit: Min assist, HOB elevated General bed mobility comments: increased time and effort, minA for assist with L LE intermittently Transfers Overall  transfer level: Needs assistance Equipment used: Rolling walker (2 wheeled) Transfers: Sit to/from Stand, [employer] Transfers Sit to Stand: Min assist, Mod assist Stand pivot transfers: Mod assist General transfer comment: fluctuating between Min-ModA dependent on fatigue levels, max cues for hand placement and safety; ModA to pivot over to [facility] while controlling RW and maintaining balance Ambulation/Gait General Gait  Details: deferred due to stool incontinence- but able to take small shuffle steps during transfers   ADL:   Cognition: Cognition Overall Cognitive Status: Within Functional Limits for tasks assessed Arousal/Alertness: Awake/alert Orientation Level: Oriented X4 Attention: Focused, Sustained, Selective Focused Attention: Appears intact Sustained Attention: Appears intact Selective Attention: Appears intact Memory: Appears intact Awareness: Appears intact Problem Solving: Appears intact Executive Function: Organizing, Decision Making, Sequencing, Reasoning Reasoning: Appears intact Sequencing: Appears intact Organizing: Appears intact Decision Making: Appears intact Safety/Judgment: Appears intact Cognition Arousal/Alertness: Awake/alert Behavior During Therapy: WFL for tasks assessed/performed Overall Cognitive Status: Within Functional Limits for tasks assessed General Comments: perhaps a bit HOH and with difficulty understanding through masks/face shield but WNL otherwise   Blood pressure (!) 163/71, pulse 73, temperature 98.1 F (36.7 C), temperature source Axillary, resp. rate 18, height 5' (1.524 m), weight 59.1 kg, SpO2 96 %. Physical Exam   General: Alert and oriented x 3, No apparent distress HEENT: Head is normocephalic, atraumatic, PERRLA, EOMI, sclera anicteric, oral mucosa pink and moist, dentition intact, ext ear canals clear,  Neck: Supple without JVD or lymphadenopathy Heart: Reg rate and rhythm. No murmurs rubs or gallops Chest: CTA bilaterally without wheezes, rales, or rhonchi; no distress Abdomen: Soft, non-tender, non-distended, bowel sounds positive. Extremities: No clubbing, cyanosis, or edema. Pulses are 2+ Skin: Clean and intact without signs of breakdown Neuro: Patient is alert no acute distress.  Makes eye contact with examiner.  Provides her name age and date of birth.  Follows simple commands. 5/5 strength throughout Psych: Pt's affect is  appropriate. Pt is cooperative     Lab Results Last 24 Hours       Results for orders placed or performed during the hospital encounter of 11/17/19 (from the past 24 hour(s))  Basic metabolic panel     Status: Abnormal    Collection Time: 11/17/19  1:41 PM  Result Value Ref Range    Sodium 133 (L) 135 - 145 mmol/L    Potassium 4.4 3.5 - 5.1 mmol/L    Chloride 100 98 - 111 mmol/L    CO2 23 22 - 32 mmol/L    Glucose, Bld 108 (H) 70 - 99 mg/dL    BUN 22 8 - 23 mg/dL    Creatinine, Ser 1.10 (H) 0.44 - 1.00 mg/dL    Calcium 9.8 8.9 - 10.3 mg/dL    GFR, Estimated 43 (L) >60 mL/min    Anion gap 10 5 - 15  CBC     Status: Abnormal    Collection Time: 11/17/19  1:41 PM  Result Value Ref Range    WBC 6.2 4.0 - 10.5 K/uL    RBC 3.80 (L) 3.87 - 5.11 MIL/uL    Hemoglobin 11.3 (L) 12.0 - 15.0 g/dL    HCT 35.8 (L) 36 - 46 %    MCV 94.2 80.0 - 100.0 fL    MCH 29.7 26.0 - 34.0 pg    MCHC 31.6 30.0 - 36.0 g/dL    RDW 13.6 11.5 - 15.5 %    Platelets 234 150 - 400 K/uL    nRBC 0.0 0.0 - 0.2 %  Respiratory Panel by RT PCR (Flu A&B, Covid) - Nasopharyngeal Swab     Status: None    Collection Time: 11/17/19  5:32 PM    Specimen: Nasopharyngeal Swab  Result Value Ref Range    SARS Coronavirus 2 by RT PCR NEGATIVE NEGATIVE    Influenza A by PCR NEGATIVE NEGATIVE    Influenza B by PCR NEGATIVE NEGATIVE  Urinalysis, Routine w reflex microscopic Urine, Clean Catch     Status: Abnormal    Collection Time: 11/17/19  5:44 PM  Result Value Ref Range    Color, Urine YELLOW YELLOW    APPearance HAZY (A) CLEAR    Specific Gravity, Urine 1.008 1.005 - 1.030    pH 6.0 5.0 - 8.0    Glucose, UA NEGATIVE NEGATIVE mg/dL    Hgb urine dipstick NEGATIVE NEGATIVE    Bilirubin Urine NEGATIVE NEGATIVE    Ketones, ur NEGATIVE NEGATIVE mg/dL    Protein, ur 30 (A) NEGATIVE mg/dL    Nitrite NEGATIVE NEGATIVE    Leukocytes,Ua LARGE (A) NEGATIVE    RBC / HPF 0-5 0 - 5 RBC/hpf    WBC, UA 21-50 0 - 5 WBC/hpf     Bacteria, UA FEW (A) NONE SEEN    Squamous Epithelial / LPF 0-5 0 - 5  CBC     Status: Abnormal    Collection Time: 11/18/19 12:07 AM  Result Value Ref Range    WBC 6.5 4.0 - 10.5 K/uL    RBC 3.60 (L) 3.87 - 5.11 MIL/uL    Hemoglobin 11.1 (L) 12.0 - 15.0 g/dL    HCT 34.3 (L) 36 - 46 %    MCV 95.3 80.0 - 100.0 fL    MCH 30.8 26.0 - 34.0 pg    MCHC 32.4 30.0 - 36.0 g/dL    RDW 13.6 11.5 - 15.5 %    Platelets 223 150 - 400 K/uL    nRBC 0.0 0.0 - 0.2 %  Lipid panel     Status: Abnormal    Collection Time: 11/18/19  4:40 AM  Result Value Ref Range    Cholesterol 212 (H) 0 - 200 mg/dL    Triglycerides 90 <150 mg/dL    HDL 62 >40 mg/dL    Total CHOL/HDL Ratio 3.4 RATIO    VLDL 18 0 - 40 mg/dL    LDL Cholesterol 132 (H) 0 - 99 mg/dL  Basic metabolic panel     Status: Abnormal    Collection Time: 11/18/19  4:40 AM  Result Value Ref Range    Sodium 135 135 - 145 mmol/L    Potassium 3.9 3.5 - 5.1 mmol/L    Chloride 100 98 - 111 mmol/L    CO2 21 (L) 22 - 32 mmol/L    Glucose, Bld 88 70 - 99 mg/dL    BUN 21 8 - 23 mg/dL    Creatinine, Ser 1.03 (H) 0.44 - 1.00 mg/dL    Calcium 9.8 8.9 - 10.3 mg/dL    GFR, Estimated 47 (L) >60 mL/min    Anion gap 14 5 - 15  Magnesium     Status: None    Collection Time: 11/18/19  4:40 AM  Result Value Ref Range    Magnesium 2.2 1.7 - 2.4 mg/dL       Imaging Results (Last 48 hours)  CT HEAD WO CONTRAST   Result Date: 11/17/2019 CLINICAL DATA:  Acute neuro deficit.  Rule out stroke.  Weakness. EXAM: CT HEAD WITHOUT CONTRAST TECHNIQUE: Contiguous  axial images were obtained from the base of the skull through the vertex without intravenous contrast. COMPARISON:  None. FINDINGS: Brain: Mild atrophy. Extensive white matter hypodensity diffusely bilaterally. Negative for acute cortical infarct. Negative for hemorrhage or mass. Vascular: Negative for hyperdense vessel Skull: Negative Sinuses/Orbits: Air-fluid level sphenoid sinus. Remaining sinuses clear.  Mastoid clear bilaterally. Bilateral cataract extraction. Other: Multiple skin lesions in the scalp. Calcified dermal lesion right parietal scalp most likely Pilar cyst. IMPRESSION: Atrophy with diffuse white matter disease most likely chronic microvascular ischemia No acute abnormality. Electronically Signed   By: Franchot Gallo M.D.   On: 11/17/2019 14:52    MR BRAIN WO CONTRAST   Result Date: 11/17/2019 CLINICAL DATA:  86 year old female with acute weakness, imbalance. EXAM: MRI HEAD WITHOUT CONTRAST TECHNIQUE: Multiplanar, multiecho pulse sequences of the brain and surrounding structures were obtained without intravenous contrast. COMPARISON:  Head CT earlier today. FINDINGS: Brain: Linear, patchy restricted diffusion in the right corona radiata tracking toward the external capsule (series 5, image 75 and series 7, image 62). No other restricted diffusion. Underlying Patchy and confluent bilateral cerebral white matter T2 and FLAIR hyperintensity. Similar signal heterogeneity throughout the bilateral basal ganglia, worse on the right. Occasional chronic micro hemorrhages (left periatrial white matter series 14, image 29). No evidence of acute hemorrhage. No mass effect. No midline shift, evidence of mass lesion, ventriculomegaly, extra-axial collection. Cervicomedullary junction and pituitary are within normal limits. Mild for age signal heterogeneity in the pons. No cortical encephalomalacia identified. Vascular: Major intracranial vascular flow voids are preserved. Skull and upper cervical spine: Cervical spine detailed separately today. Normal visible bone marrow signal. Sinuses/Orbits: Negative, postoperative changes to both globes. Other: Mastoids are well pneumatized. Visible internal auditory structures appear normal. Scalp and face appear negative. IMPRESSION: 1. Acute small vessel infarct in the right corona radiata, external capsule. No associated hemorrhage or mass effect. 2. Underlying moderate  to advanced chronic small vessel disease. Electronically Signed   By: Genevie Ann M.D.   On: 11/17/2019 18:44    MR Cervical Spine Wo Contrast   Result Date: 11/17/2019 CLINICAL DATA:  86 year old female with acute weakness, imbalance. EXAM: MRI CERVICAL SPINE WITHOUT CONTRAST TECHNIQUE: Multiplanar, multisequence MR imaging of the cervical spine was performed. No intravenous contrast was administered. COMPARISON:  Brain MRI today reported separately. FINDINGS: Alignment: Mild degenerative anterolisthesis of C2 on C3, C7 on T1. Overall straightening of cervical lordosis. Vertebrae: Widespread degenerative endplate marrow signal changes in the cervical spine. No marrow edema or evidence of acute osseous abnormality. Benign vertebral body hemangioma at T2 on the right. Cord: No cervical spinal cord signal abnormality despite multilevel degenerative stenosis with some cord mass effect detailed below. Visible upper thoracic cord appears normal. Posterior Fossa, vertebral arteries, paraspinal tissues: Cervicomedullary junction is within normal limits. Brain is detailed separately today. Preserved major vascular flow voids in the neck. Thyromegaly (series 7, image 6 on the right, but no follow-up indicated in this age group (ref: J Am Coll Radiol. 2015 Feb;12(2): 143-50). Disc levels: C2-C3: Anterolisthesis associated with moderate left and severe right facet hypertrophy. Ligament flavum hypertrophy. No spinal stenosis. Moderate to severe bilateral C3 foraminal stenosis greater on the right. C3-C4: Severe disc space loss. Circumferential disc osteophyte complex. Mild to moderate facet and ligament flavum hypertrophy. Mild spinal stenosis. Mild if any cord mass effect. Severe bilateral C4 foraminal stenosis. C4-C5: Severe disc space loss. Circumferential disc osteophyte complex. Mild to moderate posterior element hypertrophy. Mild spinal stenosis and spinal cord mass  effect. Severe bilateral C5 foraminal stenosis. C5-C6:  Disc space loss with circumferential disc osteophyte complex. Mild to moderate posterior element hypertrophy. No significant spinal stenosis. Moderate left and moderate to severe right C6 foraminal stenosis. C6-C7: Mild disc space loss. Circumferential disc osteophyte complex. Moderate facet and ligament flavum hypertrophy. No significant spinal stenosis. Moderate bilateral C7 foraminal stenosis. C7-T1: Mild anterolisthesis. Mild to moderate posterior element hypertrophy. No significant stenosis. IMPRESSION: 1. No acute osseous abnormality in the cervical spine. Mild degenerative spondylolisthesis at C2-C3 and C7-T1 with facet arthropathy. 2. Widespread cervical spine degeneration. Mild spinal stenosis at C3-C4 and C4-C5 with up to mild spinal cord mass effect, but no cord signal abnormality. 3. Widespread moderate and severe degenerative neural foraminal stenosis, only sparing the C8 nerve level. Electronically Signed   By: Genevie Ann M.D.   On: 11/17/2019 18:56    CT ANGIO HEAD CODE STROKE   Result Date: 11/17/2019 CLINICAL DATA:  TIA EXAM: CT ANGIOGRAPHY HEAD AND NECK TECHNIQUE: Multidetector CT imaging of the head and neck was performed using the standard protocol during bolus administration of intravenous contrast. Multiplanar CT image reconstructions and MIPs were obtained to evaluate the vascular anatomy. Carotid stenosis measurements (when applicable) are obtained utilizing NASCET criteria, using the distal internal carotid diameter as the denominator. CONTRAST:  53mL OMNIPAQUE IOHEXOL 350 MG/ML SOLN COMPARISON:  None. FINDINGS: CTA NECK FINDINGS SKELETON: There is no bony spinal canal stenosis. No lytic or blastic lesion. OTHER NECK: Heterogeneous and enlarged right thyroid lobe measuring 3 1 x 2.5 cm. UPPER CHEST: Biapical emphysema AORTIC ARCH: There is calcific atherosclerosis of the aortic arch. There is no aneurysm, dissection or hemodynamically significant stenosis of the visualized portion of the  aorta. Conventional 3 vessel aortic branching pattern. The visualized proximal subclavian arteries are widely patent. RIGHT CAROTID SYSTEM: No dissection, occlusion or aneurysm. There is predominantly calcified atherosclerosis extending into the proximal ICA, resulting in 50% stenosis. LEFT CAROTID SYSTEM: No dissection, occlusion or aneurysm. Mild atherosclerotic calcification at the carotid bifurcation without hemodynamically significant stenosis. VERTEBRAL ARTERIES: Codominant configuration. Both origins are clearly patent. There is no dissection, occlusion or flow-limiting stenosis to the skull base (V1-V3 segments). CTA HEAD FINDINGS POSTERIOR CIRCULATION: --Vertebral arteries: Normal V4 segments. --Inferior cerebellar arteries: Normal. --Basilar artery: Normal. --Superior cerebellar arteries: Normal. --Posterior cerebral arteries (PCA): Normal. ANTERIOR CIRCULATION: --Intracranial internal carotid arteries: Atherosclerotic calcification of the internal carotid arteries at the skull base without hemodynamically significant stenosis. --Anterior cerebral arteries (ACA): Normal. Both A1 segments are present. Patent anterior communicating artery (a-comm). --Middle cerebral arteries (MCA): Normal. VENOUS SINUSES: As permitted by contrast timing, patent. ANATOMIC VARIANTS: None Review of the MIP images confirms the above findings. IMPRESSION: 1. No intracranial arterial occlusion or high-grade stenosis. 2. Approximately 50% stenosis of the proximal right internal carotid artery secondary to predominantly calcified atherosclerosis. 3. Heterogeneous and enlarged right thyroid lobe measuring 3 1 x 2.5 cm. Recommend thyroid ultrasound (ref: J Am Coll Radiol. 2015 Feb;12(2): 143-50). Aortic Atherosclerosis (ICD10-I70.0) and Emphysema (ICD10-J43.9). Electronically Signed   By: Ulyses Jarred M.D.   On: 11/17/2019 21:51    CT ANGIO NECK CODE STROKE   Result Date: 11/17/2019 CLINICAL DATA:  TIA EXAM: CT ANGIOGRAPHY HEAD  AND NECK TECHNIQUE: Multidetector CT imaging of the head and neck was performed using the standard protocol during bolus administration of intravenous contrast. Multiplanar CT image reconstructions and MIPs were obtained to evaluate the vascular anatomy. Carotid stenosis measurements (when applicable) are obtained utilizing NASCET criteria, using the distal  internal carotid diameter as the denominator. CONTRAST:  73mL OMNIPAQUE IOHEXOL 350 MG/ML SOLN COMPARISON:  None. FINDINGS: CTA NECK FINDINGS SKELETON: There is no bony spinal canal stenosis. No lytic or blastic lesion. OTHER NECK: Heterogeneous and enlarged right thyroid lobe measuring 3 1 x 2.5 cm. UPPER CHEST: Biapical emphysema AORTIC ARCH: There is calcific atherosclerosis of the aortic arch. There is no aneurysm, dissection or hemodynamically significant stenosis of the visualized portion of the aorta. Conventional 3 vessel aortic branching pattern. The visualized proximal subclavian arteries are widely patent. RIGHT CAROTID SYSTEM: No dissection, occlusion or aneurysm. There is predominantly calcified atherosclerosis extending into the proximal ICA, resulting in 50% stenosis. LEFT CAROTID SYSTEM: No dissection, occlusion or aneurysm. Mild atherosclerotic calcification at the carotid bifurcation without hemodynamically significant stenosis. VERTEBRAL ARTERIES: Codominant configuration. Both origins are clearly patent. There is no dissection, occlusion or flow-limiting stenosis to the skull base (V1-V3 segments). CTA HEAD FINDINGS POSTERIOR CIRCULATION: --Vertebral arteries: Normal V4 segments. --Inferior cerebellar arteries: Normal. --Basilar artery: Normal. --Superior cerebellar arteries: Normal. --Posterior cerebral arteries (PCA): Normal. ANTERIOR CIRCULATION: --Intracranial internal carotid arteries: Atherosclerotic calcification of the internal carotid arteries at the skull base without hemodynamically significant stenosis. --Anterior cerebral arteries  (ACA): Normal. Both A1 segments are present. Patent anterior communicating artery (a-comm). --Middle cerebral arteries (MCA): Normal. VENOUS SINUSES: As permitted by contrast timing, patent. ANATOMIC VARIANTS: None Review of the MIP images confirms the above findings. IMPRESSION: 1. No intracranial arterial occlusion or high-grade stenosis. 2. Approximately 50% stenosis of the proximal right internal carotid artery secondary to predominantly calcified atherosclerosis. 3. Heterogeneous and enlarged right thyroid lobe measuring 3 1 x 2.5 cm. Recommend thyroid ultrasound (ref: J Am Coll Radiol. 2015 Feb;12(2): 143-50). Aortic Atherosclerosis (ICD10-I70.0) and Emphysema (ICD10-J43.9). Electronically Signed   By: Ulyses Jarred M.D.   On: 11/17/2019 21:51         Assessment/Plan: Diagnosis: R basal ganglia infarct 1. Does the need for close, 24 hr/day medical supervision in concert with the patient's rehab needs make it unreasonable for this patient to be served in a less intensive setting? Yes 2. Co-Morbidities requiring supervision/potential complications: hyperlipidemia, mild spondylolisthesis at C2-C3, C7-T1, widespread moderate and severe degenerative foraminal stenosis, hypertensive urgency 3. Due to bladder management, bowel management, safety, skin/wound care, disease management, medication administration, pain management and patient education, does the patient require 24 hr/day rehab nursing? Yes 4. Does the patient require coordinated care of a physician, rehab nurse, therapy disciplines of PT, OT to address physical and functional deficits in the context of the above medical diagnosis(es)? Yes Addressing deficits in the following areas: balance, endurance, locomotion, strength, transferring, bowel/bladder control, bathing, dressing, feeding, grooming, toileting and psychosocial support 5. Can the patient actively participate in an intensive therapy program of at least 3 hrs of therapy per day at  least 5 days per week? Yes 6. The potential for patient to make measurable gains while on inpatient rehab is excellent 7. Anticipated functional outcomes upon discharge from inpatient rehab are modified independent  with PT, modified independent with OT, independent with SLP. 8. Estimated rehab length of stay to reach the above functional goals is: 10-14 days 9. Anticipated discharge destination: Home 10. Overall Rehab/Functional Prognosis: excellent   RECOMMENDATIONS: This patient's condition is appropriate for continued rehabilitative care in the following setting: CIR Patient has agreed to participate in recommended program. Yes Note that insurance prior authorization may be required for reimbursement for recommended care.   Comment: Thank you for this consult. Admission coordinator to  follow.    I have personally performed a face to face diagnostic evaluation, including, but not limited to relevant history and physical exam findings, of this patient and developed relevant assessment and plan.  Additionally, I have reviewed and concur with the physician assistant's documentation above.   Leeroy Cha, MD   Lavon Paganini [place], PA-C 11/18/2019

## 2019-11-22 NOTE — Plan of Care (Signed)
  Problem: Consults Goal: RH STROKE PATIENT EDUCATION Description: See Patient Education module for education specifics  Outcome: Progressing   Problem: RH BOWEL ELIMINATION Goal: RH STG MANAGE BOWEL WITH ASSISTANCE Description: STG Manage Bowel with mod I Assistance. Outcome: Progressing   Problem: RH BLADDER ELIMINATION Goal: RH STG MANAGE BLADDER WITH ASSISTANCE Description: STG Manage Bladder With Mod I Assistance Outcome: Progressing   Problem: RH SKIN INTEGRITY Goal: RH STG SKIN FREE OF INFECTION/BREAKDOWN Description: Skin to remain free from breakdown and infection while on rehab with mod I assistance. Outcome: Progressing Goal: RH STG MAINTAIN SKIN INTEGRITY WITH ASSISTANCE Description: STG Maintain Skin Integrity With mod I Assistance. Outcome: Progressing   Problem: RH PAIN MANAGEMENT Goal: RH STG PAIN MANAGED AT OR BELOW PT'S PAIN GOAL Description: <3 on a 0-10 pain scale. Outcome: Progressing   Problem: RH KNOWLEDGE DEFICIT Goal: RH STG INCREASE KNOWLEDGE OF HYPERTENSION Description: Patient and caregiver will be able to demonstrate knowledge of HTN medications, medication management, dietary control, BP management, and follow up care with the MD with cues and reminders from rehab staff. Outcome: Progressing Goal: RH STG INCREASE KNOWLEGDE OF HYPERLIPIDEMIA Description: Patient and caregiver will be able to demonstrate knowledge of HLD medications, medication management, dietary control, and follow up care with the MD with cues and reminders from rehab staff.  Outcome: Progressing Goal: RH STG INCREASE KNOWLEDGE OF STROKE PROPHYLAXIS Description: Patient and caregiver will be able to demonstrate knowledge of medications used to treat and prevent future strokes, medication management, dietary control, BP management, cholesterol management, and follow up care with the MD with cues and reminders from rehab staff.  Outcome: Progressing

## 2019-11-22 NOTE — Progress Notes (Signed)
Inpatient Rehabilitation  Patient information reviewed and entered into eRehab system by Coolidge Gossard M. Syd Manges, M.A., CCC/SLP, PPS Coordinator.  Information including medical coding, functional ability and quality indicators will be reviewed and updated through discharge.    

## 2019-11-22 NOTE — Progress Notes (Signed)
Orthopedic Tech Progress Note Patient Details:  Cindy Smith Aug 29, 1923 [id number] Called in order to [facility]. Patient ID: Cindy Smith, female   DOB: [birth year], 86 y.o.   MRN: [id number]   [name] 11/22/2019, 2:42 PM

## 2019-11-22 NOTE — Progress Notes (Signed)
[facility] PHYSICAL MEDICINE & REHABILITATION PROGRESS NOTE   Subjective/Complaints:  Had a restless night. Didn't get back into bed until later. Left knee giving her problems ("tore a ligament") and was an issue prior to cva. She has a soft knee brace with her she had used previously. Stools move frequently d/t previous colon resection  ROS: Patient denies fever, rash, sore throat, blurred vision, nausea, vomiting, diarrhea, cough, shortness of breath or chest pain,  headache, or mood change.    Objective:   No results found. Recent Labs    11/22/19 0628  WBC 6.2  HGB 11.0*  HCT 34.2*  PLT 251   Recent Labs    11/22/19 0628  NA 138  K 4.2  CL 104  CO2 25  GLUCOSE 97  BUN 34*  CREATININE 1.45*  CALCIUM 9.5    Intake/Output Summary (Last 24 hours) at 11/22/2019 1322 Last data filed at 11/22/2019 0700 Gross per 24 hour  Intake 120 ml  Output --  Net 120 ml     Pressure Injury 11/19/19 Coccyx Bilateral Stage 1 -  Intact skin with non-blanchable redness of a localized area usually over a bony prominence. Non blanchable,red flaky skin, bilaterally (Active)  11/19/19 1456  Location: Coccyx  Location Orientation: Bilateral  Staging: Stage 1 -  Intact skin with non-blanchable redness of a localized area usually over a bony prominence.  Wound Description (Comments): Non blanchable,red flaky skin, bilaterally  Present on Admission: Yes    Physical Exam: Vital Signs Blood pressure (!) 143/51, pulse (!) 51, temperature 98.4 F (36.9 C), resp. rate 16, height 5' (1.524 m), weight 58.9 kg, SpO2 97 %.   Constitutional: No distress . Vital signs reviewed. HEENT: EOMI, oral membranes moist Neck: supple Cardiovascular: RRR without murmur. No JVD    Respiratory/Chest: CTA Bilaterally without wheezes or rales. Normal effort    GI/Abdomen: BS +, non-tender, non-distended Ext: no clubbing, cyanosis, or edema Psych: pleasant and cooperative Skin: small area on coccyx,  redness.  Neurologic: Cranial nerves II through XII intact, motor strength is 5/5 in right and 4/5 left deltoid, bicep, tricep, grip, hip flexor, knee extensors, ankle dorsiflexor and plantar flexor Normal sensory, no obvious ataxia Musculoskeletal: left knee with significant valgus deformity and patellar laxity  Assessment/Plan: 1. Functional deficits secondary to Right corona radiata infarct which require 3+ hours per day of interdisciplinary therapy in a comprehensive inpatient rehab setting.  Physiatrist is providing close team supervision and 24 hour management of active medical problems listed below.  Physiatrist and rehab team continue to assess barriers to discharge/monitor patient progress toward functional and medical goals  Care Tool:  Bathing    Body parts bathed by patient: Right arm, Left arm, Chest, Abdomen, Front perineal area, Face, Left lower leg, Right lower leg, Left upper leg, Right upper leg   Body parts bathed by helper: Buttocks     Bathing assist Assist Level: Contact Guard/Touching assist     Upper Body Dressing/Undressing Upper body dressing   What is the patient wearing?: Pull over shirt    Upper body assist Assist Level: Supervision/Verbal cueing    Lower Body Dressing/Undressing Lower body dressing      What is the patient wearing?: Incontinence brief, Pants     Lower body assist Assist for lower body dressing: Maximal Assistance - Patient 25 - 49%     Toileting Toileting    Toileting assist Assist for toileting: Maximal Assistance - Patient 25 - 49%     Transfers  Chair/bed transfer  Transfers assist     Chair/bed transfer assist level: Moderate Assistance - Patient 50 - 74%     Locomotion Ambulation   Ambulation assist      Assist level: Minimal Assistance - Patient > 75% Assistive device: Rollator Max distance: 36'   Walk 10 feet activity   Assist     Assist level: Minimal Assistance - Patient > 75% Assistive  device: Rollator   Walk 50 feet activity   Assist Walk 50 feet with 2 turns activity did not occur: Safety/medical concerns         Walk 150 feet activity   Assist Walk 150 feet activity did not occur: Safety/medical concerns         Walk 10 feet on uneven surface  activity   Assist Walk 10 feet on uneven surfaces activity did not occur: Safety/medical concerns         Wheelchair     Assist Will patient use wheelchair at discharge?: Yes (Per PT long term goals ) Type of Wheelchair: Manual    Wheelchair assist level: Minimal Assistance - Patient > 75% Max wheelchair distance: 150    Wheelchair 50 feet with 2 turns activity    Assist        Assist Level: Minimal Assistance - Patient > 75%   Wheelchair 150 feet activity     Assist      Assist Level: Minimal Assistance - Patient > 75%   Blood pressure (!) 143/51, pulse (!) 51, temperature 98.4 F (36.9 C), resp. rate 16, height 5' (1.524 m), weight 58.9 kg, SpO2 97 %.  Medical Problem List and Plan: 1.  Blurred vision with gait disturbance secondary to right Corona radiata infarction.  Event monitor to be arranged for outpatient             -patient may shower             -ELOS/Goals: 10-12 days mod I  -will d/w PT re: hinged knee brace to support left knee (valgus deformity, patella laxity 2.  Antithrombotics: -DVT/anticoagulation:  SCD             -antiplatelet therapy: Aspirin 81 mg daily and Plavix 75 mg daily x3 weeks then aspirin alone 3. Pain Management: Tylenol as needed. Add kpad for upper back muscle spasms.  4. Mood: Provide emotional support             -antipsychotic agents: N/A 5. Neuropsych: This patient is capable of making decisions on her own behalf. 6. Skin/Wound Care: encourage appropriate PO   7. Fluids/Electrolytes/Nutrition: encourage better PO  -I personally reviewed the patient's labs today.   -BUN up to 34--encourage PO fluids   -recheck bmet 10/19  8.   Hypertension.  Norvasc 10 mg daily, Tenormin 25 mg daily.    - po Hydralazine 25mg  q8H prn for SBP>180.   -improved control 10/18 9.  Hyperlipidemia.  Lipitor 10.  History of colon cancer with colectomy.  Follow-up outpatient  -add probiotic to bulk up stool 11.  Anemia of chronic disease.  Follow-up hgb stable at 11.0 12.  Incidental finding of nontoxic multinodular goiter.  Follow-up outpatient ultrasound    LOS: 3 days A FACE TO FACE EVALUATION WAS PERFORMED  Meredith Staggers 11/22/2019, 1:22 PM

## 2019-11-22 NOTE — IPOC Note (Signed)
Overall Plan of Care [facility]) Patient Details Name: Cindy Smith MRN: [id number] DOB: 19-Mar-1923  Admitting Diagnosis: Infarction of right basal ganglia [facility])  Hospital Problems: Principal Problem:   Infarction of right basal ganglia (HCC) Active Problems:   Pressure injury of skin     Functional Problem List: Nursing Behavior  PT Balance  OT Balance, Motor, Sensory, Skin Integrity, Vision, Safety, Endurance  SLP    TR         Basic ADL's: OT Grooming, Bathing, Dressing, Toileting     Advanced  ADL's: OT Laundry, Simple Meal Preparation     Transfers: PT Bed Mobility, Bed to Chair, Car, [employer], Floor  OT Toilet     Locomotion: PT Ambulation, Emergency planning/management officer, Stairs     Additional Impairments: OT None  SLP        TR      Anticipated Outcomes Item Anticipated Outcome  Self Feeding No goal  Swallowing      Basic self-care  Mod I  Toileting  Mod I   Bathroom Transfers Mod I  Bowel/Bladder  Mod I  Transfers  Mod I with LRAD  Locomotion  Mod I ambulation at house hold level  Communication     Cognition     Pain  <3  Safety/Judgment  Mod I   Therapy Plan: PT Intensity: Minimum of 1-2 x/day ,45 to 90 minutes PT Frequency: 5 out of 7 days PT Duration Estimated Length of Stay: 12-14 days OT Intensity: Minimum of 1-2 x/day, 45 to 90 minutes OT Frequency: 5 out of 7 days OT Duration/Estimated Length of Stay: 12-14 days     Due to the current state of emergency, patients may not be receiving their 3-hours of Medicare-mandated therapy.   Team Interventions: Nursing Interventions Bladder Management, Patient/Family Education, Bowel Management, Disease Management/Prevention, Pain Management, Medication Management, Skin Care/Wound Management, Discharge Planning  PT interventions Ambulation/gait training, Psychosocial support, Functional mobility training, Discharge planning, Therapeutic Activities, Visual/perceptual  remediation/compensation, Wheelchair propulsion/positioning, Skin care/wound management, Therapeutic Exercise, Neuromuscular re-education, Balance/vestibular training, Disease management/prevention, Cognitive remediation/compensation, DME/adaptive equipment instruction, Pain management, Splinting/orthotics, UE/LE Strength taining/ROM, Community reintegration, Technical sales engineer stimulation, Patient/family education, UE/LE Coordination activities, IT trainer  OT Interventions Training and development officer, Community reintegration, Disease mangement/prevention, Brewing technologist, Barrister's clerk education, Self Care/advanced ADL retraining, Therapeutic Exercise, UE/LE Coordination activities, Wheelchair propulsion/positioning, Visual/perceptual remediation/compensation, UE/LE Strength taining/ROM, Therapeutic Activities, Psychosocial support, Pain management, Functional mobility training, DME/adaptive equipment instruction, Discharge planning  SLP Interventions    TR Interventions    SW/CM Interventions Discharge Planning, Psychosocial Support, Patient/Family Education   Barriers to Discharge MD  Medical stability  Nursing Decreased caregiver support, Home environment access/layout, Incontinence, Wound Care, Weight    PT Inaccessible home environment, Decreased caregiver support, Home environment access/layout, Lack of/limited family support    OT Decreased caregiver support, Home environment access/layout, Incontinence    SLP      SW       Team Discharge Planning: Destination: PT-Home ,OT- Home , SLP-  Projected Follow-up: PT-Home health PT, OT-  Home health OT, SLP-  Projected Equipment Needs: PT-To be determined, OT- To be determined, SLP-  Equipment Details: PT- , OT-  Patient/family involved in discharge planning: PT- Patient, Family member/caregiver,  OT-Patient, SLP-   MD ELOS: 12-14 days Medical Rehab Prognosis:  Excellent Assessment: The patient has been admitted for  CIR therapies with the diagnosis of right basal ganglia infarct. Mobility inhibited by arthritic left knee as well. The team will be addressing functional mobility, strength, stamina, balance,  safety, adaptive techniques and equipment, self-care, bowel and bladder mgt, patient and caregiver education, NMR, orthotics, pain control, community reentry. Goals have been set at mod I for basic mobility and self-care.   Due to the current state of emergency, patients may not be receiving their 3 hours per day of Medicare-mandated therapy.    Meredith Staggers, MD, FAAPMR      See Team Conference Notes for weekly updates to the plan of care

## 2019-11-22 NOTE — Progress Notes (Signed)
Physical Therapy Session Note  Patient Details  Name: Cindy Smith MRN: [id number] Date of Birth: 04-11-1923  Today's Date: 11/22/2019 PT Individual Time: 1105-1200 and 1420-1531 PT Individual Time Calculation (min): 55 min and 71 min  Short Term Goals: Week 1:  PT Short Term Goal 1 (Week 1): Pt will transfer to and from [facility] with CGA. PT Short Term Goal 2 (Week 1): Pt will ambulate 46ft with CGA and RW PT Short Term Goal 3 (Week 1): Pt will perform bed mobility with supervision assist  Skilled Therapeutic Interventions/Progress Updates:     1st Session:  Pt received seated in recliner and agreeable to therapy. Reports no pain. Stand pivot transfer from recliner to [facility] with minA HHA. WC transport to gym for time management.   Pt ambulates multiple bouts in gym. Initial bout x22' with RW and minA. Pt has severe genu valgus on L side. When asked how ambulation felt relative to pre-CVA, pt remarks that biggest difference was that she used rollator at baseline. Subsequent gait trial with rollator. Pt ambulates 47' with minA. Gait speed slightly improved and pt ambulates with forearms and elbows on rollator for stability. Pt ambulates 3rd bout x36' with rollator and 3lb ankle weights on BLEs, ambulating 36'. Following ambulation pt performs seated marching with ankle weights 2x10, then "tracing" alphabet with alternating BLEs, then "counting to 10". Performed for strengthening and coordination training.  WC transport back to room. Stand pivot transfer back to recliner with mina. Left seated with alarm intact and all needs within reach.  2nd Session:  Pt received seated in recliner and agrees to therapy. No complaint of pain. SPT to [facility] with CGA and reduced eccentric control. PT cues on safety. WC transport to gym for time management.   Pt performs UE ergometer for 10:00 at workload of 5:00. Pt performs 50% going forward and 50% going backward. Reports some neck discomfort following  activity, due to "looking up". Pt repositions pt for comfort and pt reports improved symptoms.  Pt performs gait training with EVO walker to allow for improved UE support and posture, and to increase gait distance for LE strengthening. Pt ambulates 100' x2 with minA.  Pt performs NMR in standing with EVO walker and mirror positioned for visual feedback. Pt practices remove UE support and is able to balance for ~30 seconds at a time with CGA.  SPT back to recliner with CGA. Left seated in recliner with alarm intact and all needs within reach.  Therapy Documentation Precautions:  Precautions Precautions: Fall Precaution Comments: chronic Lt knee injury, knee brace when OOB Restrictions Weight Bearing Restrictions: No   Therapy/Group: Individual Therapy  Breck Coons, PT, DPT 11/22/2019, 3:40 PM

## 2019-11-22 NOTE — Progress Notes (Signed)
Occupational Therapy Session Note  Patient Details  Name: Cindy Smith MRN: [id number] Date of Birth: 04-24-1923  Today's Date: 11/22/2019 OT Individual Time: 4818-5909 OT Individual Time Calculation (min): 78 min    Short Term Goals: Week 1:  OT Short Term Goal 1 (Week 1): Pt will don pants with Mod A using AE as needed OT Short Term Goal 2 (Week 1): Pt will complete 1/3 components of toileting with no more than Min balance assistance OT Short Term Goal 3 (Week 1): Pt will complete toilet transfer with Min A using LRAD   Skilled Therapeutic Interventions/Progress Updates:    Pt greeted at time of session sitting up in recliner with no brace, saying she is supposed to get a new one soon as MD is ordering a new one. Daughter present throughout session. Pt agreeable to shower, stating she does sink baths at home but wanted to do a shower to wash hair. SPT recliner > wheelchair > shower Mod A with RW and grab bars for shower transfer. UB/LB bathing with supervision for UB and CGA for LB with lateral leans. Dried off Min A for buttocks, SPT back to wheelchair with Mod A and UB dress S, LB dress Max A for brief and pants. Set up at sink, grooming with hairdryer and brush with CS. SPT back to recliner Mod A, set up with daughter present alarm on and call bell in reach.   Therapy Documentation Precautions:  Precautions Precautions: Fall Precaution Comments: chronic Lt knee injury, knee brace when OOB Restrictions Weight Bearing Restrictions: No    Therapy/Group: Individual Therapy  Viona Gilmore 11/22/2019, 12:00 PM

## 2019-11-23 ENCOUNTER — Inpatient Hospital Stay (HOSPITAL_COMMUNITY): Payer: Medicare Other | Admitting: Occupational Therapy

## 2019-11-23 ENCOUNTER — Inpatient Hospital Stay (HOSPITAL_COMMUNITY): Payer: Medicare Other

## 2019-11-23 NOTE — Patient Care Conference (Signed)
Inpatient RehabilitationTeam Conference and Plan of Care Update Date: 11/23/2019   Time: 10:50 AM    Patient Name: Cindy Smith      Medical Record Number: [id number]  Date of Birth: [birth year] Sex: Female         Room/Bed: 4W23C/4W23C-01 Payor Info: Payor: MEDICARE / Plan: MEDICARE PART A AND B / Product Type: *No Product type* /    Admit Date/Time:  11/19/2019  1:41 PM  Primary Diagnosis:  Infarction of right basal ganglia [facility])  Hospital Problems: Principal Problem:   Infarction of right basal ganglia [facility]) Active Problems:   Pressure injury of skin    Expected Discharge Date: Expected Discharge Date: 12/01/19  Team Members Present: Physician leading conference: Dr. Alger Simons Care Coodinator Present: Loralee Pacas, LCSWA;Anneka Studer Creig Hines, RN, BSN, [place] Nurse Present: Dwaine Gale, RN PT Present: Tereasa Coop, PT OT Present: Meriel Pica, OT PPS Coordinator present : Ileana Ladd, Burna Mortimer, SLP     Current Status/Progress Goal Weekly Team Focus  Bowel/Bladder   Patient incontinent of bowel and bladder currently managed with incontinent breifs and Q2 hour toileting. BSC transfer during HS LBM 11/22/19  decrease incontinent episodes and increase control of bladder and bowel  Assess toiletin needs Q2 and PRN   Swallow/Nutrition/ Hydration             ADL's   CGA-Min shower level bathing (sink baths at home), transfers Mod with extended time, LB dress Max to don over hips in standing,  LLE internal rotation  Mod I (lives alone)  LB dress, standing balance with one UE support, endurance, ADL transfers   Mobility   minA bed mobility, sit to stand, close supervision bed to chair, minA gait 38' with rollator. Signifcant premorbid genu valgus LLE.  mod(I), gait household distances with LRAD  Strength, endurance, balance, ambulation   Communication             Safety/Cognition/ Behavioral Observations            Pain   patient denies pain  maintain  pain free status  assess pain Q4hers and as needed   Skin   Patient has pressure injury to sacrum present on admission.  Prevent new breakdown and promote healing  q2 hour turns, pillow wedge support for comfort     Discharge Planning:  D/c to home with dtr who will initially be in the home to provide assistance. Pt will have support from family that lives locally that will check in on patient.   Team Discussion: Incontinent B/B, stage 1 to sacrum with foam dressing in place. OT contact guard to min assist shower level bathing, transfers are mod assist with extra time. Goals are Mod I. PT min assist with bed mobility, close supervision for sit to stand, min assist for gait of 56' with a rollator. Goals are Mod I for household distances with LRAD. Patient on target to meet rehab goals: yes  *See Care Plan and progress notes for long and short-term goals.   Revisions to Treatment Plan:  Not at this time.  Teaching Needs: Continue with family education   Current Barriers to Discharge: Medical stability, Nutritional means and pain and balance.  Possible Resolutions to Barriers: Offer nutritional support, continue with current medication regimen, hinged left knee brace to control valgus and decrease pain, and BP management.     Medical Summary Current Status: right basal ganglia infarct. suboptimal solid/liquid intake. loose stool. painful left knee/valgus deformity. mild sundowning at night  Barriers  to Discharge: Medical stability   Possible Resolutions to [employer] Focus: optimizing nutrition. hinged knee brace for left knee to control valgus,decrease pain. bp control.   Continued Need for Acute Rehabilitation Level of Care: The patient requires daily medical management by a physician with specialized training in physical medicine and rehabilitation for the following reasons: Direction of a multidisciplinary physical rehabilitation program to maximize functional independence :  Yes Medical management of patient stability for increased activity during participation in an intensive rehabilitation regime.: Yes Analysis of laboratory values and/or radiology reports with any subsequent need for medication adjustment and/or medical intervention. : Yes   I attest that I was present, lead the team conference, and concur with the assessment and plan of the team.   Cristi Loron 11/23/2019, 3:17 PM

## 2019-11-23 NOTE — Progress Notes (Signed)
Occupational Therapy Session Note  Patient Details  Name: Cindy Smith MRN: [id number] Date of Birth: November 25, 1923  Today's Date: 11/23/2019 OT Individual Time: 6837-2902 OT Individual Time Calculation (min): 56 min    Short Term Goals: Week 1:  OT Short Term Goal 1 (Week 1): Pt will don pants with Mod A using AE as needed OT Short Term Goal 2 (Week 1): Pt will complete 1/3 components of toileting with no more than Min balance assistance OT Short Term Goal 3 (Week 1): Pt will complete toilet transfer with Min A using LRAD   Skilled Therapeutic Interventions/Progress Updates:    Pt greeted at time of session sitting up in bed eating breakfast, c/o difficulty swallowing her food at bed level and requesting to sit up in chair to finish meal. Bed mobility supine to sit Min A, sit to stand Min and SPT to wheelchair Min/Mod with extended time and cues to correct posture. Pt properly positioned sitting upright and finished breakfast, no choking or difficulty swallowing. Pt sit to stand at sink level with Min A, static stand for approx 3-4 minutes with BUE support on sink surface for therapist to perform brief change (incontinent episode of B&B), total A for donning new brief in standing but Max for pants, pt able to stand with LUE support and assist with donning over hips with R hand. Total A for donning knee brace. Pt unaware of incontinent episode. Donned jacket with Supervision and brushed hair in same manner. Pt set up in wheelchair with alarm on, call bell in reach.   Therapy Documentation Precautions:  Precautions Precautions: Fall Precaution Comments: chronic Lt knee injury, knee brace when OOB Restrictions Weight Bearing Restrictions: No    Therapy/Group: Individual Therapy  Viona Gilmore 11/23/2019, 9:38 AM

## 2019-11-23 NOTE — Progress Notes (Signed)
   Patient Details  Name: ZAKEYA JUNKER MRN: [id number] Date of Birth: 1923-12-22  Today's Date: 11/23/2019  Hospital Problems: Principal Problem:   Infarction of right basal ganglia (HCC) Active Problems:   Pressure injury of skin  Past Medical History:  Past Medical History:  Diagnosis Date  . HTN (hypertension)   . Hyperthyroidism    Past Surgical History:  Past Surgical History:  Procedure Laterality Date  . COLECTOMY     Social History:  reports that she has never smoked. She has never used smokeless tobacco. She reports that she does not drink alcohol and does not use drugs.  Family / Support Systems Marital Status: Widow/Widower How Long?: 10 years Patient Roles: Parent Spouse/Significant Other: Widowed Children: 5 children. 4 living children. Other Supports: extended family' neighbors Anticipated Caregiver: dtr Lattie Haw and Latanya Maudlin, brother in law and granddaughter Jillian Ability/Limitations of Caregiver: All family members work, however, everyone will rotate providing care to patient. Dtr Lattie Haw will intially stay with patient at time of discharge. Family Dynamics: Pt lives alone. Has a housekeeper that comes in to clean home once a month. Other family members help with errands, yard work.  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 Education: high school Read: Yes Write: Yes Employment Status:  (Never worked) Public relations account executive Issues: Denies Guardian/Conservator: N/A   Abuse/Neglect Abuse/Neglect Assessment Can Be Completed: Yes Physical Abuse: Denies Verbal Abuse: Denies Sexual Abuse: Denies Exploitation of patient/patient's resources: Denies Self-Neglect: Denies  Emotional Status Pt's affect, behavior and adjustment status: Pt in good spirits at time of visit. Reports mild anxiety with completing assessment and trying to eat at the same time. Recent Psychosocial Issues:  Denies Psychiatric History: Denies Substance Abuse History: Denies  Patient / Family Perceptions, Expectations & Goals Pt/Family understanding of illness & functional limitations: Pt and family have a good understanding of care needs Premorbid pt/family roles/activities: Independent with minimal assistance Anticipated changes in roles/activities/participation: Assistance with ADLs/IADLs Pt/family expectations/goals: Pt goal is improve balance and using left lewg and foot better  [employer]: None Premorbid Home Care/DME Agencies: None Transportation available at discharge: family Resource referrals recommended: Neuropsychology  Discharge Planning Living Arrangements: Alone Support Systems: Children, Other relatives Type of Residence: Private residence Insurance Resources: [employer], Multimedia programmer (specify) Nurse, mental health) Financial Resources: Social Security, Family Support Financial Screen Referred: No Living Expenses: Own Money Management: Patient, Family Does the patient have any problems obtaining your medications?: No Home Management: Pt performs general housekeeping and simple meal prep. Has a houskeeper once per month. Family does yardwork, and takes trash out. Patient/Family Preliminary Plans: No changes. Care Coordinator Anticipated Follow Up Needs: HH/OP  Clinical Impression SW met with pt and pt dtr Lattie Haw in room to introduce self, explain role, and discuss discharge process. Pt not a vetran. HCPOA/POA dtr Riley Churches [phone]). Pt admits she has always had balance issues prior to admission. DME: rollator, powerchair, w/c (given to her).  Aury Scollard A Nandita Mathenia 11/23/2019, 9:56 AM

## 2019-11-23 NOTE — Progress Notes (Signed)
[facility] PHYSICAL MEDICINE & REHABILITATION PROGRESS NOTE   Subjective/Complaints:  Did a little better last night. Asked NT to check in on her before bed. She was asleep when NT came in, and she struggled falling back to asleep afterwards. Also she has her daily routines which she's accustomed to doing which she hasn't been able to follow since being in the hospital. That is often frustrating for her. Still some incontinence  ROS: Patient denies fever, rash, sore throat, blurred vision, nausea, vomiting, diarrhea, cough, shortness of breath or chest pain, joint or back pain, headache, or mood change.    Objective:   No results found. Recent Labs    11/22/19 0628  WBC 6.2  HGB 11.0*  HCT 34.2*  PLT 251   Recent Labs    11/22/19 0628  NA 138  K 4.2  CL 104  CO2 25  GLUCOSE 97  BUN 34*  CREATININE 1.45*  CALCIUM 9.5    Intake/Output Summary (Last 24 hours) at 11/23/2019 1055 Last data filed at 11/22/2019 1700 Gross per 24 hour  Intake 360 ml  Output --  Net 360 ml     Pressure Injury 11/19/19 Coccyx Bilateral Stage 1 -  Intact skin with non-blanchable redness of a localized area usually over a bony prominence. Non blanchable,red flaky skin, bilaterally (Active)  11/19/19 1456  Location: Coccyx  Location Orientation: Bilateral  Staging: Stage 1 -  Intact skin with non-blanchable redness of a localized area usually over a bony prominence.  Wound Description (Comments): Non blanchable,red flaky skin, bilaterally  Present on Admission: Yes    Physical Exam: Vital Signs Blood pressure (!) 166/43, pulse (!) 54, temperature 97.8 F (36.6 C), resp. rate 15, height 5' (1.524 m), weight 58.9 kg, SpO2 97 %.   Constitutional: No distress . Vital signs reviewed. HEENT: EOMI, oral membranes moist Neck: supple Cardiovascular: RRR without murmur. No JVD    Respiratory/Chest: CTA Bilaterally without wheezes or rales. Normal effort    GI/Abdomen: BS +, non-tender,  non-distended Ext: no clubbing, cyanosis, or edema Psych: pleasant and cooperative Skin: small area on coccyx, redness.  Neurologic: reasonable insight and awareness. Cranial nerves II through XII intact, motor strength is 5/5 in right and 4/5 left deltoid, bicep, tricep, grip, hip flexor, knee extensors, ankle dorsiflexor and plantar flexor Normal sensory, no obvious ataxia Musculoskeletal: left knee with significant valgus deformity and patellar laxity  Assessment/Plan: 1. Functional deficits secondary to Right corona radiata infarct which require 3+ hours per day of interdisciplinary therapy in a comprehensive inpatient rehab setting.  Physiatrist is providing close team supervision and 24 hour management of active medical problems listed below.  Physiatrist and rehab team continue to assess barriers to discharge/monitor patient progress toward functional and medical goals  Care Tool:  Bathing    Body parts bathed by patient: Right arm, Left arm, Chest, Abdomen, Front perineal area, Face, Left lower leg, Right lower leg, Left upper leg, Right upper leg   Body parts bathed by helper: Buttocks     Bathing assist Assist Level: Contact Guard/Touching assist     Upper Body Dressing/Undressing Upper body dressing   What is the patient wearing?: Pull over shirt    Upper body assist Assist Level: Supervision/Verbal cueing    Lower Body Dressing/Undressing Lower body dressing      What is the patient wearing?: Incontinence brief, Pants     Lower body assist Assist for lower body dressing: Maximal Assistance - Patient 25 - 49%  Toileting Toileting    Toileting assist Assist for toileting: Maximal Assistance - Patient 25 - 49%     Transfers Chair/bed transfer  Transfers assist     Chair/bed transfer assist level: Moderate Assistance - Patient 50 - 74%     Locomotion Ambulation   Ambulation assist      Assist level: Minimal Assistance - Patient >  75% Assistive device: Rollator Max distance: 36'   Walk 10 feet activity   Assist     Assist level: Minimal Assistance - Patient > 75% Assistive device: Rollator   Walk 50 feet activity   Assist Walk 50 feet with 2 turns activity did not occur: Safety/medical concerns         Walk 150 feet activity   Assist Walk 150 feet activity did not occur: Safety/medical concerns         Walk 10 feet on uneven surface  activity   Assist Walk 10 feet on uneven surfaces activity did not occur: Safety/medical concerns         Wheelchair     Assist Will patient use wheelchair at discharge?: Yes (Per PT long term goals ) Type of Wheelchair: Manual    Wheelchair assist level: Minimal Assistance - Patient > 75% Max wheelchair distance: 150    Wheelchair 50 feet with 2 turns activity    Assist        Assist Level: Minimal Assistance - Patient > 75%   Wheelchair 150 feet activity     Assist      Assist Level: Minimal Assistance - Patient > 75%   Blood pressure (!) 166/43, pulse (!) 54, temperature 97.8 F (36.6 C), resp. rate 15, height 5' (1.524 m), weight 58.9 kg, SpO2 97 %.  Medical Problem List and Plan: 1.  Blurred vision with gait disturbance secondary to right Corona radiata infarction.  Event monitor to be arranged for outpatient             -patient may shower             -ELOS/Goals: 10/27, team conference today  -trial today with hinged knee brace to support left knee (valgus deformity, patella laxity) 2.  Antithrombotics: -DVT/anticoagulation:  SCD             -antiplatelet therapy: Aspirin 81 mg daily and Plavix 75 mg daily x3 weeks then aspirin alone 3. Pain Management: Tylenol as needed. Added kpad for upper back muscle spasms.  4. Mood: Provide emotional support             -antipsychotic agents: N/A 5. Neuropsych: This patient is capable of making decisions on her own behalf. 6. Skin/Wound Care: encourage appropriate PO   7.  Fluids/Electrolytes/Nutrition: encourage better PO  -I personally reviewed the patient's labs today.   -BUN up to 34--encourage PO fluids   -recheck bmet 10/20  8.  Hypertension.  Norvasc 10 mg daily, Tenormin 25 mg daily.    - po Hydralazine 25mg  q8H prn for SBP>180.   -improved control 10/19 9.  Hyperlipidemia.  Lipitor 10.  History of colon cancer with colectomy.  Follow-up outpatient  -added probiotic to bulk up stool  -dietary choices 11.  Anemia of chronic disease.  Follow-up hgb stable at 11.0 12.  Incidental finding of nontoxic multinodular goiter.  Follow-up outpatient ultrasound    LOS: 4 days A FACE TO [place] 11/23/2019, 10:55 AM

## 2019-11-23 NOTE — Progress Notes (Signed)
Physical Therapy Session Note  Patient Details  Name: Cindy Smith MRN: [id number] Date of Birth: 1923/03/01  Today's Date: 11/23/2019 PT Individual Time: 1300-1400 PT Individual Time Calculation (min): 60 min   Short Term Goals: Week 1:  PT Short Term Goal 1 (Week 1): Pt will transfer to and from [facility] with CGA. PT Short Term Goal 2 (Week 1): Pt will ambulate 15ft with CGA and RW PT Short Term Goal 3 (Week 1): Pt will perform bed mobility with supervision assist Week 2:    Week 3:     Skilled Therapeutic Interventions/Progress Updates:    PAIN Pt w/no c/o pain during session.  Rest breaks provided as needed for fatigue.  Pt initially seated in wc, daughter at bedside requesting to be shown how to don knee brace.  Reviewed parts of brace and proper positioning then demonstrated/applied brace properly.  wc propulsion x 63ft w/bilat UEs, transported to gym for time/energy conservation.  STS to swedish walker w/cga. Gait 2ft  W/swedish walker, cga, cues for posture.  Pt w/severe valgus deformity and ER at L knee.  States brace "limits the bend" but is not uncomfortable.  Standing blance using elbows on walker: Reaching to one side to retrieve weighted horshoes from chair height and transferring to opposite side to waist hi bedside table, rp  Repeated gait x 64ft as above.  Pt transported to dayroom SPT to Nustep w/min assist and verbal cues for sequencing. Therapist educated pt and set up properly for use of NuStep Nustep x 6 min L4 total steps =  141.  Pt stated "That makes me feel alive", apparently enjoyed this strenghteing/cardiovascular activity. Nustep to wc SPT w/min assist as above. Pt transported back to room at end of session. Pt left oob in wc w/alarm belt set and needs in reach, daughter at bedside.       Therapy Documentation Precautions:  Precautions Precautions: Fall Precaution Comments: chronic Lt knee injury, knee brace when OOB Restrictions Weight  Bearing Restrictions: No    Therapy/Group: Individual Therapy  Callie Fielding, [place] 11/23/2019, 5:34 PM

## 2019-11-23 NOTE — Progress Notes (Signed)
Patient ID: Cindy Smith, female   DOB: 07/04/1923, 86 y.o.   MRN: [id number]  SW met with pt dtr Lattie Haw in room to provide updates from team conference, and d/c date 10/27. Reports while she leaves on Friday for [place], there will be other family members to provide assistance. She would like ot confirm if 24/7 care is needed. SW encouraged to prepare for this initially and will have confirmation from by Monday.  Loralee Pacas, MSW, [place] Office: [phone] Cell: [phone] Fax: [phone]

## 2019-11-24 ENCOUNTER — Inpatient Hospital Stay (HOSPITAL_COMMUNITY): Payer: Medicare Other | Admitting: Physical Therapy

## 2019-11-24 ENCOUNTER — Inpatient Hospital Stay (HOSPITAL_COMMUNITY): Payer: Medicare Other

## 2019-11-24 ENCOUNTER — Inpatient Hospital Stay (HOSPITAL_COMMUNITY): Payer: Medicare Other | Admitting: Occupational Therapy

## 2019-11-24 ENCOUNTER — Inpatient Hospital Stay (HOSPITAL_COMMUNITY): Payer: Medicare Other | Admitting: *Deleted

## 2019-11-24 LAB — BASIC METABOLIC PANEL
Anion gap: 9 (ref 5–15)
BUN: 32 mg/dL — ABNORMAL HIGH (ref 8–23)
CO2: 22 mmol/L (ref 22–32)
Calcium: 9.3 mg/dL (ref 8.9–10.3)
Chloride: 107 mmol/L (ref 98–111)
Creatinine, Ser: 1.08 mg/dL — ABNORMAL HIGH (ref 0.44–1.00)
GFR, Estimated: 44 mL/min — ABNORMAL LOW (ref >60)
Glucose, Bld: 93 mg/dL (ref 70–99)
Potassium: 3.7 mmol/L (ref 3.5–5.1)
Sodium: 138 mmol/L (ref 135–145)

## 2019-11-24 MED ORDER — NUTRISOURCE FIBER PO PACK
1.0000 | PACK | Freq: Two times a day (BID) | ORAL | Status: DC
  Administered 2019-11-24 – 2019-11-28 (×9): 1
  Filled 2019-11-24 (×10): qty 1

## 2019-11-24 NOTE — Progress Notes (Signed)
Patient ID: Cindy Smith, female   DOB: 1923-05-19, 86 y.o.   MRN: [id number]  SW met with pt, and pt granddaughter in room to provide [facility] list (via [email or web address].[email or web address]), and update that intermittent supervision was recommended per therapy.   SW called dtr Cindy Smith [phone]) to inform on above. SW informed will follow-up with more updates after team conference. SW waiting on updates with regard to [facility] preference.   Loralee Pacas, MSW, [place] Office: [phone] Cell: [phone] Fax: [phone]

## 2019-11-24 NOTE — Plan of Care (Signed)
MOD ASSIST WITH ADLS

## 2019-11-24 NOTE — Progress Notes (Signed)
Physical Therapy Session Note  Patient Details  Name: Cindy Smith MRN: [id number] Date of Birth: [birth year]  Today's Date: 11/24/2019 PT Individual Time: 6269-4854 PT Individual Time Calculation (min): 72 min   Short Term Goals: Week 1:  PT Short Term Goal 1 (Week 1): Pt will transfer to and from [facility] with CGA. PT Short Term Goal 2 (Week 1): Pt will ambulate 65ft with CGA and RW PT Short Term Goal 3 (Week 1): Pt will perform bed mobility with supervision assist  Skilled Therapeutic Interventions/Progress Updates:     Pt received seated in [facility] and agreeable to therapy. No complaint of pain. Pt does mention that she has been upset about toileting and feeling like she is not able to receive assistance toileting when needed. PT provides active listening and suggests toileting schedule, which is passed on to RN following session.  WC transport outside for energy conservation. Pt performs seated therex, including 1x15 overhead presses with 4lb bar, 1x15 biceps curls with bar + level 1 theraband resistance, 1x15 lateral trunk rotations with extended elbows holding onto bar in vertical position. Pt also performs 1x15 LAQs with level 1 tband around both ankles, providing stability with opposite LE.  Pt stands and performs 1x10 heel raises with PT providing tactile facilitation of LLE external rotation and and abduction to prevent valgus collapse in L knee.  Pt ambulates 100' with EVA walker and theraband providing external facilitation of LLE external rotation and abduction, with PT providing multimodal cues for optimal gait pattern. PT provides minA for walker management and trunk support.  Pt left seated in WC with alarm intact and all needs within reach.  Therapy Documentation Precautions:  Precautions Precautions: Fall Precaution Comments: chronic Lt knee injury, knee brace when OOB Restrictions Weight Bearing Restrictions: No  Therapy/Group: Individual Therapy  Breck Coons,  PT, DPT 11/24/2019, 3:46 PM

## 2019-11-24 NOTE — Evaluation (Signed)
Recreational Therapy Assessment and Plan  Patient Details  Name: Cindy Smith MRN: [id number] Date of Birth: 1923-09-20 Today's Date: 11/24/2019  Rehab Potential:   ELOS:   10/27   Hospital Problem: Principal Problem:   Infarction of right basal ganglia ([place]) Active Problems:   Pressure injury of skin   Past Medical History:      Past Medical History:  Diagnosis Date  . HTN (hypertension)   . Hyperthyroidism    Past Surgical History:       Past Surgical History:  Procedure Laterality Date  . COLECTOMY      Assessment & Plan Clinical Impression: Patient is a 86 year old right-handed female with history of hypertension as well as colon cancer with colectomy. Per chart review lives alone independent with assistive device. 1 level home one-step to entry. She does have family checks on her routinely. Presented 11/17/2019 with blurred vision and gait disturbance. CT/MRI showed acute small vessel infarct in the right corona radiata external capsule. No associated hemorrhage. CT angiogram of the head and neck with no intracranial occlusion or high-grade stenosis. Incidental finding of thyroid nodule. Echocardiogram with ejection fraction of 65 to 70% no wall motion abnormalities grade 2 diastolic dysfunction. Admission chemistry sodium 133 glucose 108 creatinine 1.10 urinalysis negative nitrite. Currently maintained on aspirin and Plavix for CVA prophylaxis.   Patient transferred to CIR on 11/19/2019.   Met with pt today to discuss TR services, leisure interests, activity analysis.  Pt presents with decreased activity tolerance, decreased functional mobility, decreased balance Limiting pt's independence with leisure/community pursuits.  Education provided on the importance of leisure and it's impact on social, emotional, spiritual, cognitive health in addition to physical health.  Pt stated understanding.  Plan  No further TR as pt is discharging  10/27  Recommendations for other services: None   Discharge Criteria: Patient will be discharged from TR if patient refuses treatment 3 consecutive times without medical reason.  If treatment goals not met, if there is a change in medical status, if patient makes no progress towards goals or if patient is discharged from hospital.  The above assessment, treatment plan, treatment alternatives and goals were discussed and mutually agreed upon: by patient  [place] 11/24/2019, 4:20 PM

## 2019-11-24 NOTE — Progress Notes (Signed)
[facility] PHYSICAL MEDICINE & REHABILITATION PROGRESS NOTE   Subjective/Complaints:  Pt upset about her bowel and bladder incontinence. Described her Q2 hours schedule at home on [facility]. Didn't always empty but she tried. Also got up around 0200 to do the same. Having incontinence more because she's not getting to toilet as much. Trying to figure out which foods she does better with  ROS: Patient denies fever, rash, sore throat, blurred vision, nausea, vomiting,   cough, shortness of breath or chest pain, joint or back pain, headache, or mood change.   Objective:   No results found. Recent Labs    11/22/19 0628  WBC 6.2  HGB 11.0*  HCT 34.2*  PLT 251   Recent Labs    11/22/19 0628 11/24/19 0623  NA 138 138  K 4.2 3.7  CL 104 107  CO2 25 22  GLUCOSE 97 93  BUN 34* 32*  CREATININE 1.45* 1.08*  CALCIUM 9.5 9.3    Intake/Output Summary (Last 24 hours) at 11/24/2019 5809 Last data filed at 11/23/2019 1315 Gross per 24 hour  Intake 240 ml  Output --  Net 240 ml     Pressure Injury 11/19/19 Coccyx Bilateral Stage 1 -  Intact skin with non-blanchable redness of a localized area usually over a bony prominence. Non blanchable,red flaky skin, bilaterally (Active)  11/19/19 1456  Location: Coccyx  Location Orientation: Bilateral  Staging: Stage 1 -  Intact skin with non-blanchable redness of a localized area usually over a bony prominence.  Wound Description (Comments): Non blanchable,red flaky skin, bilaterally  Present on Admission: Yes    Physical Exam: Vital Signs Blood pressure (!) 175/45, pulse (!) 53, temperature 98.7 F (37.1 C), resp. rate 16, height 5' (1.524 m), weight 58.9 kg, SpO2 97 %.   Constitutional: No distress . Vital signs reviewed. HEENT: EOMI, oral membranes moist Neck: supple Cardiovascular: RRR without murmur. No JVD    Respiratory/Chest: CTA Bilaterally without wheezes or rales. Normal effort    GI/Abdomen: BS +, non-tender,  non-distended Ext: no clubbing, cyanosis, or edema Psych: pleasant and cooperative Skin: small area on sac/cocx w/ redness.  Neurologic: reasonable insight and awareness. Cranial nerves II through XII intact, motor strength is 5/5 in right and 4/5 left deltoid, bicep, tricep, grip, hip flexor, knee extensors, ankle dorsiflexor and plantar flexor Normal sensory,no limb ataxia Musculoskeletal: left knee with significant valgus deformity and patellar laxity  Assessment/Plan: 1. Functional deficits secondary to Right corona radiata infarct which require 3+ hours per day of interdisciplinary therapy in a comprehensive inpatient rehab setting.  Physiatrist is providing close team supervision and 24 hour management of active medical problems listed below.  Physiatrist and rehab team continue to assess barriers to discharge/monitor patient progress toward functional and medical goals  Care Tool:  Bathing    Body parts bathed by patient: Right arm, Left arm, Chest, Abdomen, Front perineal area, Face, Left lower leg, Right lower leg, Left upper leg, Right upper leg   Body parts bathed by helper: Buttocks     Bathing assist Assist Level: Contact Guard/Touching assist     Upper Body Dressing/Undressing Upper body dressing   What is the patient wearing?: Pull over shirt    Upper body assist Assist Level: Supervision/Verbal cueing    Lower Body Dressing/Undressing Lower body dressing      What is the patient wearing?: Incontinence brief, Pants     Lower body assist Assist for lower body dressing: Maximal Assistance - Patient 25 - 49%  Toileting Toileting    Toileting assist Assist for toileting: Maximal Assistance - Patient 25 - 49%     Transfers Chair/bed transfer  Transfers assist     Chair/bed transfer assist level: Contact Guard/Touching assist     Locomotion Ambulation   Ambulation assist      Assist level: Minimal Assistance - Patient > 75% Assistive  device: Walker-Eva Max distance: 80   Walk 10 feet activity   Assist     Assist level: Minimal Assistance - Patient > 75% Assistive device: Walker-Eva   Walk 50 feet activity   Assist Walk 50 feet with 2 turns activity did not occur: Safety/medical concerns         Walk 150 feet activity   Assist Walk 150 feet activity did not occur: Safety/medical concerns         Walk 10 feet on uneven surface  activity   Assist Walk 10 feet on uneven surfaces activity did not occur: Safety/medical concerns         Wheelchair     Assist Will patient use wheelchair at discharge?: Yes (Per PT long term goals ) Type of Wheelchair: Manual    Wheelchair assist level: Minimal Assistance - Patient > 75% Max wheelchair distance: 150    Wheelchair 50 feet with 2 turns activity    Assist        Assist Level: Minimal Assistance - Patient > 75%   Wheelchair 150 feet activity     Assist      Assist Level: Minimal Assistance - Patient > 75%   Blood pressure (!) 175/45, pulse (!) 53, temperature 98.7 F (37.1 C), resp. rate 16, height 5' (1.524 m), weight 58.9 kg, SpO2 97 %.  Medical Problem List and Plan: 1.  Blurred vision with gait disturbance secondary to right Corona radiata infarction.  Event monitor to be arranged for outpatient             -patient may shower             -ELOS/Goals: 10/27   -continue trial with left knee brace for genu valgus 2.  Antithrombotics: -DVT/anticoagulation:  SCD             -antiplatelet therapy: Aspirin 81 mg daily and Plavix 75 mg daily x3 weeks then aspirin alone 3. Pain Management: Tylenol as needed. Added kpad for upper back muscle spasms.  4. Mood: Provide emotional support             -antipsychotic agents: N/A 5. Neuropsych: This patient is capable of making decisions on her own behalf. 6. Skin/Wound Care: encourage appropriate PO   7. Fluids/Electrolytes/Nutrition: encourage better PO  -I personally reviewed  the patient's labs today.   -10/20 BUN stable/decreased to 32   -continue to encourage PO 8.  Hypertension.  Norvasc 10 mg daily, Tenormin 25 mg daily.    - po Hydralazine 25mg  q8H prn for SBP>180.   -fair control 10/20 9.  Hyperlipidemia.  Lipitor 10.  History of colon cancer with colectomy.  Follow-up outpatient  -added probiotic to bulk up stool  -will ask RD to consult re: food choices given short gut/xrt  -spoke with nurse about q2 hour toileting schedule 11.  Anemia of chronic disease.  Follow-up hgb stable at 11.0 12.  Incidental finding of nontoxic multinodular goiter.  Follow-up outpatient ultrasound    LOS: 5 days A FACE TO FACE EVALUATION WAS PERFORMED  Meredith Staggers 11/24/2019, 9:07 AM

## 2019-11-24 NOTE — Progress Notes (Signed)
Occupational Therapy Session Note  Patient Details  Name: Cindy Smith MRN: [id number] Date of Birth: [birth year]  Today's Date: 11/24/2019 OT Individual Time: 6815-9470 OT Individual Time Calculation (min): 48 min    Short Term Goals: Week 1:  OT Short Term Goal 1 (Week 1): Pt will don pants with Mod A using AE as needed OT Short Term Goal 2 (Week 1): Pt will complete 1/3 components of toileting with no more than Min balance assistance OT Short Term Goal 3 (Week 1): Pt will complete toilet transfer with Min A using LRAD  Skilled Therapeutic Interventions/Progress Updates:  Patient met seated on BSC with NT's present. Patient continent of bladder with smear of BM present in commode. Sit to stand from [facility] with Min A. Patient able to perform hygiene/clothing management in standing with assist to maintain standing balance. Patient ambulated short distance to sink for hand washing. Occasional unilateral UE support on sink surface during task. Total A for wc transport room <> dayroom for time management and energy conservation. Patient engaged in therapeutic exercise with use of Kinetron for 5 minutes without rest break. Session concluded with patient seated in recliner with call bell within reach, belt alarm activated, and female family member seated at bedside.   Therapy Documentation Precautions:  Precautions Precautions: Fall Precaution Comments: chronic Lt knee injury, knee brace when OOB Restrictions Weight Bearing Restrictions: No General:    Therapy/Group: Individual Therapy  Dejanae Helser R Howerton-Davis 11/24/2019, 10:15 AM

## 2019-11-24 NOTE — Progress Notes (Signed)
Physical Therapy Session Note  Patient Details  Name: Cindy Smith MRN: [id number] Date of Birth: Aug 18, 1923  Today's Date: 11/24/2019 PT Individual Time: 0801-0915 PT Individual Time Calculation (min): 74 min   Short Term Goals: Week 1:  PT Short Term Goal 1 (Week 1): Pt will transfer to and from [facility] with CGA. PT Short Term Goal 2 (Week 1): Pt will ambulate 55ft with CGA and RW PT Short Term Goal 3 (Week 1): Pt will perform bed mobility with supervision assist  Skilled Therapeutic Interventions/Progress Updates:    pt received in bed and agreeable to therapy with nursing present. Pt requested to change clothes prior to leaving room  And directed in doffing gown in bed, donning shirt at CGA; pt directed in sit>supine with HOB elevated min A and mod A STS with FWW from EOB, then step transfer with FWW to BSC min A with extra time to complete overall. Pt directed in static standing at FWW min A improving to CGA for doffing brief, pt had small BM in brief and directed in to sit at [facility] to attempt to complete. Pt able to void in [facility], and mod A for hygiene with pt limited in self cleaning with noted Genuvalgus decreasing space for pt clean. Pt directed in x2 STS from [facility] min A at [facility] with VC for hand placement, pt directed in donning new brief and pants in standing min A for stability; knee brace donned in sitting. Pt directed in stand pivot transfer to WC, min A and sat for functional tasks at sink, area setup for increased therapeutic training with reaching outside BOS at [place]. Pt then taken to gym in [facility] total A for time. Pt directed in gait training with FWW for 50' and 50' min A for stability and VC for trunk extension, walker proximity, and increased BOS however pt limited with Genuvalgus. Pt denied pain throughout. Pt reported too fatigued to attempt additional standing at this time and directed in seated BLE strengthening exercise of marching, LAQ 2x10 and taken to room in [facility] for time and  energy conservation. Pt directed in STS and step transfer to reclined at min A with FWW, left in WC, alarm belt set, All needs in reach and in good condition. Call light in hand.  And nursing present.   Therapy Documentation Precautions:  Precautions Precautions: Fall Precaution Comments: chronic Lt knee injury, knee brace when OOB Restrictions Weight Bearing Restrictions: No       Therapy/Group: Individual Therapy  Junie Panning 11/24/2019, 10:14 AM

## 2019-11-25 ENCOUNTER — Inpatient Hospital Stay (HOSPITAL_COMMUNITY): Payer: Medicare Other

## 2019-11-25 ENCOUNTER — Inpatient Hospital Stay (HOSPITAL_COMMUNITY): Payer: Medicare Other | Admitting: Occupational Therapy

## 2019-11-25 NOTE — Progress Notes (Signed)
Initial Nutrition Assessment  DOCUMENTATION CODES:   Not applicable  INTERVENTION:   - Discussed diet/food options and recommendations with pt and family.  - Recommend liberalizing diet to Regular to allow for more food options.  - Continue florastor and Nutrisource fiber per MD.  NUTRITION DIAGNOSIS:   Increased nutrient needs related to other (therapies) as evidenced by estimated needs.  GOAL:   Patient will meet greater than or equal to 90% of their needs  MONITOR:   PO intake, Labs, Weight trends, Skin, I & O's  REASON FOR ASSESSMENT:   Consult Assessment of nutrition requirement/status  ASSESSMENT:   86 year old female with PMH of HTN, colon cancer s/p colectomy. Presented 11/17/19 with blurred vision and gait disturbance. CT/MRI showed acute small vessel infarct in the right corona radiata external capsule. Admitted to CIR on 11/19/19.   Noted target d/c date of 10/27.  Spoke with pt at bedside. Son-in-law in room during visit.  Pt reports that her appetite is good and that she typically eats well. Pt frequently referring to the "routine" that she has at home and expresses frustration with not being able to adhere to a similar routine during CIR stay. She states that she believes that part of the reason why she is having issues with frequent loose stools is because of her change in routine and being unable to eat the foods that she normally does. Pt reports that she has been eating more sugary foods during CIR admission as well. Discussed with pt how high-sugar foods may be contributing to changes in bowel movements. Also discussed importance of fiber as a bulking agent. Pt typically eats a lot of fiber at home (lima beans, whole wheat toast, etc.) but hasn't been eating as much fiber during admission. Noted MD has ordered Nutrisource fiber BID.  Pt reports that since starting to take Nutrisource Fiber, she has noticed a decrease in the frequency of her bowel  movements.  No weight history available in chart. Pt denies any recent weight changes.  Meal Completion: 25-100%  Medications reviewed and include: cholecalciferol, Nutrisource fiber BID, florastor, vitamin B-12  Labs reviewed: BUN 32, creatinine 1.08  NUTRITION - FOCUSED PHYSICAL EXAM:  Unable to complete. RD visit ended due to PT schedule.  Diet Order:   Diet Order            Diet Heart Room service appropriate? Yes; Fluid consistency: Thin  Diet effective now                 EDUCATION NEEDS:   Education needs have been addressed  Skin:  Skin Assessment: Skin Integrity Issues: Stage I: coccyx Other: MASD to buttocks  Last BM:  11/24/19 small type 6  Height:   Ht Readings from Last 1 Encounters:  11/19/19 5' (1.524 m)    Weight:   Wt Readings from Last 1 Encounters:  11/19/19 58.9 kg    BMI:  Body mass index is 25.36 kg/m.  Estimated Nutritional Needs:   Kcal:  1250-1450  Protein:  60-75 grams  Fluid:  1.3-1.5 L    Gaynell Face, MS, RD, LDN Inpatient Clinical Dietitian Please see AMiON for contact information.

## 2019-11-25 NOTE — Progress Notes (Signed)
Occupational Therapy Session Note  Patient Details  Name: Cindy Smith MRN: [id number] Date of Birth: Oct 11, 1923  Today's Date: 11/25/2019 OT Individual Time: 8138-8719 OT Individual Time Calculation (min): 63 min    Short Term Goals: Week 1:  OT Short Term Goal 1 (Week 1): Pt will don pants with Mod A using AE as needed OT Short Term Goal 2 (Week 1): Pt will complete 1/3 components of toileting with no more than Min balance assistance OT Short Term Goal 3 (Week 1): Pt will complete toilet transfer with Min A using LRAD  Skilled Therapeutic Interventions/Progress Updates:    Pt received in room with her son in law present. Pt agreeable to working on various transfers today with RW.   She worked on Psychologist, occupational to [employer], wc >< toilet, w/c >< tub bench in ADL apt, and wc to recliner all with only CGA. Pt did need a cue prior to each sit to align her hips with the wc versus trying to sit down at an angle.  In the bathroom pt able to doff clothing over her hips, self cleanse post urinating and then min A to pull jeans over hips. She stated she uses housecoats at home only.  Washed hands at sink.  She also said she only sponge bathes because she is afraid to step over the tub wall.  She used to have a bench for her husband prior to his death and is familiar with them.  In the ADL apt, she practiced getting in and out with only CGA. Her son in law will help her get a new bench.  Recommended that her daughter be there with her when she does want to shower at least 1x  A week to wash her hair.   Back in room, pt opted to sit in recliner for lunch.  Belt alarm on and all needs met.    Therapy Documentation Precautions:  Precautions Precautions: Fall Precaution Comments: chronic Lt knee injury, knee brace when OOB Restrictions Weight Bearing Restrictions: No  Pain: Pain Assessment Pain Score: 0-No pain ADL: ADL Eating: Set up Where Assessed-Eating: Bed level Grooming: Minimal  assistance Where Assessed-Grooming: Sitting at sink Upper Body Bathing: Setup Where Assessed-Upper Body Bathing: Edge of bed Lower Body Bathing: Minimal assistance Where Assessed-Lower Body Bathing: Edge of bed Upper Body Dressing: Setup Where Assessed-Upper Body Dressing: Other (Comment) (sitting on BSc) Lower Body Dressing: Maximal assistance Where Assessed-Lower Body Dressing: Other (Comment) (sitting on BSC) Toileting: Minimal assistance Where Assessed-Toileting: Glass blower/designer: Therapist, music Method: Arts development officer: Energy manager: Not assessed   Therapy/Group: Individual Therapy  [place] 11/25/2019, 12:22 PM

## 2019-11-25 NOTE — Progress Notes (Signed)
Physical Therapy Session Note  Patient Details  Name: Cindy Smith MRN: [id number] Date of Birth: 1923/08/25  Today's Date: 11/25/2019 PT Individual Time: 1415-1530 PT Individual Time Calculation (min): 75 min   Short Term Goals: Week 1:  PT Short Term Goal 1 (Week 1): Pt will transfer to and from [facility] with CGA. PT Short Term Goal 2 (Week 1): Pt will ambulate 65ft with CGA and RW PT Short Term Goal 3 (Week 1): Pt will perform bed mobility with supervision assist  Skilled Therapeutic Interventions/Progress Updates:     Pt received supine in bed and agrees to therapy. Supine to sit mod(I) with HOB elevated and use of bedrails. Stand pivot transfer from bed to [facility] with RW and supervision, with cues to externally rotate L hip to prevent valgus collapse in knee and pronation/eversion of foot during transfer.  Pt performs NMR for standing balance and motor retraining of L lower extremity, with activities in parallel bars. Pt stands with close supervision and mirror placed for visual feedback. PT provides external facilitation of L hip external rotation and L abduction with use of blue theraband. Pt attempts to achieve midline standing posture with knees separated and feet in neutral position. Pt then performs toe taps on 5 inch block with R lower extremity. PT providing minA and facilitation of neutral L lower extremity. Pt performs 2x5 step ups onto 5 inch block with R lower extremity leading.   Pt ambulates 20' with RW and close supervision, with multimodal cuing on posture, upright gaze, and correction of deviations noted above.  Pt left seated in recliner with all needs within reach. Son in law present in room.   Therapy Documentation Precautions:  Precautions Precautions: Fall Precaution Comments: chronic Lt knee injury, knee brace when OOB Restrictions Weight Bearing Restrictions: No  Therapy/Group: Individual Therapy  Breck Coons, PT, DPT 11/25/2019, 2:25 PM

## 2019-11-25 NOTE — Progress Notes (Signed)
Occupational Therapy Session Note  Patient Details  Name: Cindy Smith MRN: [id number] Date of Birth: 1923-11-18  Today's Date: 11/25/2019 OT Individual Time: 0900-1000 OT Individual Time Calculation (min): 60 min    Short Term Goals: Week 1:  OT Short Term Goal 1 (Week 1): Pt will don pants with Mod A using AE as needed OT Short Term Goal 2 (Week 1): Pt will complete 1/3 components of toileting with no more than Min balance assistance OT Short Term Goal 3 (Week 1): Pt will complete toilet transfer with Min A using LRAD  Skilled Therapeutic Interventions/Progress Updates:    patient in bed, alert and ready for therapy session.  She denies pain but states that she needs to use the bathroom.  Supine to sitting edge of bed with CS.  SPT with RW to/from bedside commode, bed, w/c and recliner w/ RW CG/minA.  Continent/incontinent of urine.  She is able to wash peri area and buttocks with set up via lateral leans on commode.  Mod A to donn incontinence brief and pants, mod A for CM in stance.  She is able to complete oral care and hair care seated at sink with set up.  OH shirt with set up.   Completed standing posterior and lateral reach activities with focus on balance and endurance.  Completed seated posture and trunk mobility activities.  She sat in recliner at close of session, seat belt alarm set and callbell in reach, son in law present.    Therapy Documentation Precautions:  Precautions Precautions: Fall Precaution Comments: chronic Lt knee injury, knee brace when OOB Restrictions Weight Bearing Restrictions: No  Therapy/Group: Individual Therapy  Carlos Levering 11/25/2019, 7:34 AM

## 2019-11-25 NOTE — Progress Notes (Signed)
[facility] PHYSICAL MEDICINE & REHABILITATION PROGRESS NOTE   Subjective/Complaints:  Still concerned about toileting schedule. Says it's not happening frequently enough although it's better  ROS: Patient denies fever, rash, sore throat, blurred vision, nausea, vomiting,  cough, shortness of breath or chest pain, joint or back pain, headache, or mood change.    Objective:   No results found. No results for input(s): WBC, HGB, HCT, PLT in the last 72 hours. Recent Labs    11/24/19 0623  NA 138  K 3.7  CL 107  CO2 22  GLUCOSE 93  BUN 32*  CREATININE 1.08*  CALCIUM 9.3    Intake/Output Summary (Last 24 hours) at 11/25/2019 1122 Last data filed at 11/25/2019 0700 Gross per 24 hour  Intake 480 ml  Output --  Net 480 ml     Pressure Injury 11/19/19 Coccyx Bilateral Stage 1 -  Intact skin with non-blanchable redness of a localized area usually over a bony prominence. Non blanchable,red flaky skin, bilaterally (Active)  11/19/19 1456  Location: Coccyx  Location Orientation: Bilateral  Staging: Stage 1 -  Intact skin with non-blanchable redness of a localized area usually over a bony prominence.  Wound Description (Comments): Non blanchable,red flaky skin, bilaterally  Present on Admission: Yes    Physical Exam: Vital Signs Blood pressure (!) 160/55, pulse (!) 59, temperature 98.2 F (36.8 C), temperature source Oral, resp. rate 16, height 5' (1.524 m), weight 58.9 kg, SpO2 98 %.   Constitutional: No distress . Vital signs reviewed. HEENT: EOMI, oral membranes moist Neck: supple Cardiovascular: RRR without murmur. No JVD    Respiratory/Chest: CTA Bilaterally without wheezes or rales. Normal effort    GI/Abdomen: BS +, non-tender, non-distended Ext: no clubbing, cyanosis, or edema Psych: pleasant but anxious. Fixates on bowel schedule Skin: small area on sac/cocx w/ redness.  Neurologic: STM deficits. reasonable insight and awareness. Cranial nerves II through XII  intact, motor strength is 5/5 in right and 4/5 left deltoid, bicep, tricep, grip, hip flexor, knee extensors, ankle dorsiflexor and plantar flexor Normal sensory,no limb ataxia Musculoskeletal: left knee with significant valgus deformity and patellar laxity  Assessment/Plan: 1. Functional deficits secondary to Right corona radiata infarct which require 3+ hours per day of interdisciplinary therapy in a comprehensive inpatient rehab setting.  Physiatrist is providing close team supervision and 24 hour management of active medical problems listed below.  Physiatrist and rehab team continue to assess barriers to discharge/monitor patient progress toward functional and medical goals  Care Tool:  Bathing    Body parts bathed by patient: Right arm, Left arm, Chest, Abdomen, Front perineal area, Face, Left lower leg, Right lower leg, Left upper leg, Right upper leg   Body parts bathed by helper: Buttocks     Bathing assist Assist Level: Contact Guard/Touching assist     Upper Body Dressing/Undressing Upper body dressing   What is the patient wearing?: Pull over shirt    Upper body assist Assist Level: Supervision/Verbal cueing    Lower Body Dressing/Undressing Lower body dressing      What is the patient wearing?: Incontinence brief, Pants     Lower body assist Assist for lower body dressing: Maximal Assistance - Patient 25 - 49%     Toileting Toileting    Toileting assist Assist for toileting: Maximal Assistance - Patient 25 - 49%     Transfers Chair/bed transfer  Transfers assist     Chair/bed transfer assist level: Contact Guard/Touching assist     Locomotion Ambulation  Ambulation assist      Assist level: Minimal Assistance - Patient > 75% Assistive device: Ethelene Hal Max distance: 100'   Walk 10 feet activity   Assist     Assist level: Minimal Assistance - Patient > 75% Assistive device: Walker-Eva   Walk 50 feet activity   Assist Walk 50  feet with 2 turns activity did not occur: Safety/medical concerns  Assist level: Minimal Assistance - Patient > 75% Assistive device: Walker-Eva    Walk 150 feet activity   Assist Walk 150 feet activity did not occur: Safety/medical concerns         Walk 10 feet on uneven surface  activity   Assist Walk 10 feet on uneven surfaces activity did not occur: Safety/medical concerns         Wheelchair     Assist Will patient use wheelchair at discharge?: Yes (Per PT long term goals ) Type of Wheelchair: Manual    Wheelchair assist level: Minimal Assistance - Patient > 75% Max wheelchair distance: 150    Wheelchair 50 feet with 2 turns activity    Assist        Assist Level: Minimal Assistance - Patient > 75%   Wheelchair 150 feet activity     Assist      Assist Level: Minimal Assistance - Patient > 75%   Blood pressure (!) 160/55, pulse (!) 59, temperature 98.2 F (36.8 C), temperature source Oral, resp. rate 16, height 5' (1.524 m), weight 58.9 kg, SpO2 98 %.  Medical Problem List and Plan: 1.  Blurred vision with gait disturbance secondary to right Corona radiata infarction.  Event monitor to be arranged for outpatient             -patient may shower             -ELOS/Goals: 10/27   -continue trial with left knee brace for genu valgus--seems to be helping 2.  Antithrombotics: -DVT/anticoagulation:  SCD             -antiplatelet therapy: Aspirin 81 mg daily and Plavix 75 mg daily x3 weeks then aspirin alone 3. Pain Management: Tylenol as needed. Added kpad for upper back muscle spasms.  4. Mood: Provide emotional support             -antipsychotic agents: N/A 5. Neuropsych: This patient is capable of making decisions on her own behalf. 6. Skin/Wound Care: encourage appropriate PO   7. Fluids/Electrolytes/Nutrition: encourage better PO  - 10/20 BUN stable/decreased to 32   -continue to encourage PO 8.  Hypertension.  Norvasc 10 mg daily,  Tenormin 25 mg daily.    - po Hydralazine 25mg  q8H prn for SBP>180.   -fair control 10/21 9.  Hyperlipidemia.  Lipitor 10.  History of colon cancer with colectomy.  Follow-up outpatient  -added probiotic to bulk up stool  -asked RD to consult re: food choices given short gut/xrt  - q2 hour toileting schedule reiterated to nurse today 11.  Anemia of chronic disease.  Follow-up hgb stable at 11.0 12.  Incidental finding of nontoxic multinodular goiter.  Follow-up outpatient ultrasound    LOS: 6 days A FACE TO FACE EVALUATION WAS PERFORMED  Meredith Staggers 11/25/2019, 11:22 AM

## 2019-11-25 NOTE — Plan of Care (Signed)
  Problem: Consults Goal: RH STROKE PATIENT EDUCATION Description: See Patient Education module for education specifics  Outcome: Progressing   Problem: RH BOWEL ELIMINATION Goal: RH STG MANAGE BOWEL WITH ASSISTANCE Description: STG Manage Bowel with mod I Assistance. Outcome: Progressing   Problem: RH BLADDER ELIMINATION Goal: RH STG MANAGE BLADDER WITH ASSISTANCE Description: STG Manage Bladder With Mod I Assistance Outcome: Progressing   Problem: RH SKIN INTEGRITY Goal: RH STG SKIN FREE OF INFECTION/BREAKDOWN Description: Skin to remain free from breakdown and infection while on rehab with mod I assistance. Outcome: Progressing Goal: RH STG MAINTAIN SKIN INTEGRITY WITH ASSISTANCE Description: STG Maintain Skin Integrity With mod I Assistance. Outcome: Progressing   Problem: RH PAIN MANAGEMENT Goal: RH STG PAIN MANAGED AT OR BELOW PT'S PAIN GOAL Description: <3 on a 0-10 pain scale. Outcome: Progressing   Problem: RH KNOWLEDGE DEFICIT Goal: RH STG INCREASE KNOWLEDGE OF HYPERTENSION Description: Patient and caregiver will be able to demonstrate knowledge of HTN medications, medication management, dietary control, BP management, and follow up care with the MD with cues and reminders from rehab staff. Outcome: Progressing Goal: RH STG INCREASE KNOWLEGDE OF HYPERLIPIDEMIA Description: Patient and caregiver will be able to demonstrate knowledge of HLD medications, medication management, dietary control, and follow up care with the MD with cues and reminders from rehab staff.  Outcome: Progressing Goal: RH STG INCREASE KNOWLEDGE OF STROKE PROPHYLAXIS Description: Patient and caregiver will be able to demonstrate knowledge of medications used to treat and prevent future strokes, medication management, dietary control, BP management, cholesterol management, and follow up care with the MD with cues and reminders from rehab staff.  Outcome: Progressing

## 2019-11-26 ENCOUNTER — Inpatient Hospital Stay (HOSPITAL_COMMUNITY): Payer: Medicare Other | Admitting: Occupational Therapy

## 2019-11-26 ENCOUNTER — Encounter (HOSPITAL_COMMUNITY): Payer: Medicare Other | Admitting: Occupational Therapy

## 2019-11-26 ENCOUNTER — Inpatient Hospital Stay (HOSPITAL_COMMUNITY): Payer: Medicare Other

## 2019-11-26 ENCOUNTER — Ambulatory Visit (HOSPITAL_COMMUNITY): Payer: Medicare Other

## 2019-11-26 DIAGNOSIS — Z1212 Encounter for screening for malignant neoplasm of rectum: Secondary | ICD-10-CM | POA: Diagnosis not present

## 2019-11-26 NOTE — Progress Notes (Signed)
Occupational Therapy Session Note  Patient Details  Name: Cindy Smith MRN: [id number] Date of Birth: [birth year]  Today's Date: 11/26/2019 OT Individual Time: 1000-1100 OT Individual Time Calculation (min): 60 min    Short Term Goals: Week 1:  OT Short Term Goal 1 (Week 1): Pt will don pants with Mod A using AE as needed OT Short Term Goal 2 (Week 1): Pt will complete 1/3 components of toileting with no more than Min balance assistance OT Short Term Goal 3 (Week 1): Pt will complete toilet transfer with Min A using LRAD  Skilled Therapeutic Interventions/Progress Updates:    Pt received in room with her daughter present for family education. Pt stated that she had already been bathed and dressed by her nurse tech earlier as she had an accident and needed to be changed. Pt discussed that she is waiting a long time to be taken to the bathroom. She typically uses it every 2 hours.  She stated the last time she toileted with 915 so we planned to have her go at 1045 prior to [facility] session ending. And then she will call for A around 1300.   Pt repeated stated her MAIN concern was her LLE strength. During this session she worked on several sets of standing L leg AROM focusing on abd, extension, heel raises and stepping back and forth with rest breaks in between.  During this time, spent a lot of time discussing with pt and her daughter about her home set up.  We discovered from her daughter that the tub bench will not fit even if it is set up at a different angle.  Pt plans to continue to sponge bathe which she has been doing for years.  Discussed her room set up and arrangement and changes that could be made to allow her more room to move.  Recommended the HHOT to evaluate the room with pt and then make changes based on her recommendations. Discussed that changing her room set up may be disorienting to her especially if she gets up in the middle of the night.  So to be aware of that.  Pt  ambulated to the bathroom with light CGA for extra safety but pt really did not need support. She was able to push her pants down and sat on regular toilet using B grab bars (she will have this support at home),  Pt able to self cleanse but OT cleansed at the end to ensure pt had fully wiped after her BM.  Pt had not.  Recommended wet wipes at home.  Due to time constraints, assisted pt with clothing management.   Pt ambulated to sink to wash hands and then sat down in recliner.  Hand off to PT for her next session.  Therapy Documentation Precautions:  Precautions Precautions: Fall Precaution Comments: chronic Lt knee injury, knee brace when OOB Restrictions Weight Bearing Restrictions: No    Pain: no c/o pain   ADL: ADL Eating: Set up Where Assessed-Eating: Bed level Grooming: Minimal assistance Where Assessed-Grooming: Sitting at sink Upper Body Bathing: Setup Where Assessed-Upper Body Bathing: Edge of bed Lower Body Bathing: Minimal assistance Where Assessed-Lower Body Bathing: Edge of bed Upper Body Dressing: Setup Where Assessed-Upper Body Dressing: Other (Comment) (sitting on BSc) Lower Body Dressing: Maximal assistance Where Assessed-Lower Body Dressing: Other (Comment) (sitting on BSC) Toileting: Minimal assistance Where Assessed-Toileting: Glass blower/designer: Therapist, music Method: Arts development officer: Energy manager: Not assessed  Therapy/Group: Individual Therapy  [place] 11/26/2019, 12:33 PM

## 2019-11-26 NOTE — Progress Notes (Signed)
Occupational Therapy Session Note  Patient Details  Name: Cindy Smith MRN: [id number] Date of Birth: [birth year]  Today's Date: 11/26/2019 OT Individual Time: 1400-1437 OT Individual Time Calculation (min): 37 min   Short Term Goals: Week 1:  OT Short Term Goal 1 (Week 1): Pt will don pants with Mod A using AE as needed OT Short Term Goal 2 (Week 1): Pt will complete 1/3 components of toileting with no more than Min balance assistance OT Short Term Goal 3 (Week 1): Pt will complete toilet transfer with Min A using LRAD  Skilled Therapeutic Interventions/Progress Updates:    Pt greeted in the recliner with no c/o pain. ADL needs met, reports being on a timed toileting schedule due to premorbid incontinence. Dtr, Lattie Haw, present, asking OT appropriate d/c questions. OT recommended manually stalling a grab bar near the shower vs using suction grab bar, as dtr reports that they are considering showering at home now. Pt has practiced TTB transfers this week and Lattie Haw reports that they already have a TTB at home that will fit in their tub shower which is located in the bathroom in the hallway. Lattie Haw reported that pt will only shower with full supervision from family post d/c. Pt was motivated to work on her "balance" during session. She ambulated ~30 ft using RW with CGA-close supervision assist, vcs for externally rotating Lt hip and keeping "toes straight" to improve alignment of the Lt LE in stance while wearing her knee brace. She needed 1 seated rest break in the hallway due to fatigue, and then ambulated ~30 ft back to her recliner in the same manner as written above. Note that pt has to watch her feet during ambulation due to sensation deficits. While sitting in the recliner, discussed gentle ROM of ankles and keeping toes pointed towards the ceiling while sitting up. Pt and dtr appreciative of all education and input regarding d/c plans. Both have a good understanding as to what to expect and  plan for in regards to d/c. Pt remained sitting in the recliner with all needs within reach.   Therapy Documentation Precautions:  Precautions Precautions: Fall Precaution Comments: chronic Lt knee injury, knee brace when OOB Restrictions Weight Bearing Restrictions: No Vital Signs: Therapy Vitals Temp: 98.3 F (36.8 C) Pulse Rate: 60 Resp: 16 BP: (!) 160/51 Patient Position (if appropriate): Sitting Oxygen Therapy SpO2: 98 % O2 Device: Room Air ADL: ADL Eating: Set up Where Assessed-Eating: Bed level Grooming: Minimal assistance Where Assessed-Grooming: Sitting at sink Upper Body Bathing: Setup Where Assessed-Upper Body Bathing: Edge of bed Lower Body Bathing: Minimal assistance Where Assessed-Lower Body Bathing: Edge of bed Upper Body Dressing: Setup Where Assessed-Upper Body Dressing: Other (Comment) (sitting on BSc) Lower Body Dressing: Maximal assistance Where Assessed-Lower Body Dressing: Other (Comment) (sitting on BSC) Toileting: Minimal assistance Where Assessed-Toileting: Glass blower/designer: Therapist, music Method: Arts development officer: Energy manager: Not assessed      Therapy/Group: Individual Therapy  Jamilet Ambroise A Chun Sellen 11/26/2019, 3:35 PM

## 2019-11-26 NOTE — Progress Notes (Signed)
[facility] PHYSICAL MEDICINE & REHABILITATION PROGRESS NOTE   Subjective/Complaints:  Pt is happy with job staff did on her toileting schedule. Still frustrated that she can't do her home routine. Happy that stools are more formed  ROS: Patient denies fever, rash, sore throat, blurred vision, nausea, vomiting, diarrhea, cough, shortness of breath or chest pain, joint or back pain, headache, or mood change.   Objective:   No results found. No results for input(s): WBC, HGB, HCT, PLT in the last 72 hours. Recent Labs    11/24/19 0623  NA 138  K 3.7  CL 107  CO2 22  GLUCOSE 93  BUN 32*  CREATININE 1.08*  CALCIUM 9.3    Intake/Output Summary (Last 24 hours) at 11/26/2019 1258 Last data filed at 11/26/2019 0700 Gross per 24 hour  Intake 580 ml  Output --  Net 580 ml     Pressure Injury 11/19/19 Coccyx Bilateral Stage 1 -  Intact skin with non-blanchable redness of a localized area usually over a bony prominence. Non blanchable,red flaky skin, bilaterally (Active)  11/19/19 1456  Location: Coccyx  Location Orientation: Bilateral  Staging: Stage 1 -  Intact skin with non-blanchable redness of a localized area usually over a bony prominence.  Wound Description (Comments): Non blanchable,red flaky skin, bilaterally  Present on Admission: Yes    Physical Exam: Vital Signs Blood pressure (!) 164/52, pulse (!) 51, temperature 98.2 F (36.8 C), temperature source Oral, resp. rate 18, height 5' (1.524 m), weight 58.9 kg, SpO2 97 %.   Constitutional: No distress . Vital signs reviewed. HEENT: EOMI, oral membranes moist Neck: supple Cardiovascular: RRR without murmur. No JVD    Respiratory/Chest: CTA Bilaterally without wheezes or rales. Normal effort    GI/Abdomen: BS +, non-tender, non-distended Ext: no clubbing, cyanosis, or edema Psych: pleasant and cooperative Skin: small area on sac/cocx w/ redness.  Neurologic: mild STM. Repeated most of what she discussed with me  yesterday as if it were new. fair insight and awareness. Cranial nerves II through XII intact, motor strength is 5/5 in right and 4/5 left deltoid, bicep, tricep, grip, hip flexor, knee extensors, ankle dorsiflexor and plantar flexor Normal sensory  Musculoskeletal: left knee with significant valgus deformity and patellar laxity while in supine  Assessment/Plan: 1. Functional deficits secondary to Right corona radiata infarct which require 3+ hours per day of interdisciplinary therapy in a comprehensive inpatient rehab setting.  Physiatrist is providing close team supervision and 24 hour management of active medical problems listed below.  Physiatrist and rehab team continue to assess barriers to discharge/monitor patient progress toward functional and medical goals  Care Tool:  Bathing    Body parts bathed by patient: Right arm, Left arm, Chest, Abdomen, Front perineal area, Face, Left lower leg, Right lower leg, Left upper leg, Right upper leg   Body parts bathed by helper: Buttocks     Bathing assist Assist Level: Contact Guard/Touching assist     Upper Body Dressing/Undressing Upper body dressing   What is the patient wearing?: Pull over shirt    Upper body assist Assist Level: Supervision/Verbal cueing    Lower Body Dressing/Undressing Lower body dressing      What is the patient wearing?: Incontinence brief, Pants     Lower body assist Assist for lower body dressing: Maximal Assistance - Patient 25 - 49%     Toileting Toileting    Toileting assist Assist for toileting: Minimal Assistance - Patient > 75%     Transfers Chair/bed  transfer  Transfers assist     Chair/bed transfer assist level: Supervision/Verbal cueing     Locomotion Ambulation   Ambulation assist      Assist level: Supervision/Verbal cueing Assistive device: Walker-rolling Max distance: 40'   Walk 10 feet activity   Assist     Assist level: Supervision/Verbal  cueing Assistive device: Walker-rolling   Walk 50 feet activity   Assist Walk 50 feet with 2 turns activity did not occur: Safety/medical concerns  Assist level: Minimal Assistance - Patient > 75% Assistive device: Walker-Eva    Walk 150 feet activity   Assist Walk 150 feet activity did not occur: Safety/medical concerns         Walk 10 feet on uneven surface  activity   Assist Walk 10 feet on uneven surfaces activity did not occur: Safety/medical concerns         Wheelchair     Assist Will patient use wheelchair at discharge?: Yes (Per PT long term goals ) Type of Wheelchair: Manual    Wheelchair assist level: Minimal Assistance - Patient > 75% Max wheelchair distance: 150    Wheelchair 50 feet with 2 turns activity    Assist        Assist Level: Minimal Assistance - Patient > 75%   Wheelchair 150 feet activity     Assist      Assist Level: Minimal Assistance - Patient > 75%   Blood pressure (!) 164/52, pulse (!) 51, temperature 98.2 F (36.8 C), temperature source Oral, resp. rate 18, height 5' (1.524 m), weight 58.9 kg, SpO2 97 %.  Medical Problem List and Plan: 1.  Blurred vision with gait disturbance secondary to right Corona radiata infarction.  Event monitor to be arranged for outpatient             -patient may shower             -ELOS/Goals: 10/27   -continue trial with left knee brace for genu valgus--seems to be helping 2.  Antithrombotics: -DVT/anticoagulation:  SCD             -antiplatelet therapy: Aspirin 81 mg daily and Plavix 75 mg daily x3 weeks then aspirin alone 3. Pain Management: Tylenol as needed. continue kpad for upper back muscle spasms.  4. Mood: Provide emotional support             -antipsychotic agents: N/A 5. Neuropsych: This patient is capable of making decisions on her own behalf. 6. Skin/Wound Care: encourage appropriate PO   7. Fluids/Electrolytes/Nutrition: encourage better PO  - 10/20 BUN  stable/decreased to 32   -continue to encourage PO, drinking more  -10/22 check labs Monday  8.  Hypertension.  Norvasc 10 mg daily, Tenormin 25 mg daily.    - po Hydralazine 25mg  q8H prn for SBP>180.   -fair control 10/21-22 9.  Hyperlipidemia.  Lipitor 10.  History of colon cancer with colectomy.     -10/22 stools seem to be firming up  -added probiotic to bulk up stool as well as daily fiber  -appreciate RD input.   - q2 hour toileting schedule   11.  Anemia of chronic disease.  Follow-up hgb stable at 11.0 12.  Incidental finding of nontoxic multinodular goiter.  Follow-up outpatient ultrasound    LOS: 7 days A FACE TO FACE EVALUATION WAS PERFORMED  Meredith Staggers 11/26/2019, 12:58 PM

## 2019-11-26 NOTE — Progress Notes (Signed)
Physical Therapy Weekly Progress Note  Patient Details  Name: Cindy Smith MRN: [id number] Date of Birth: 12-31-1923  Beginning of progress report period: November 20, 2019 End of progress report period: November 26, 2019  Today's Date: 11/26/2019 PT Individual Time: 1101-1200 and 1505-1530 PT Individual Time Calculation (min): 59 min and 25 min  Patient has met 3 of 3 short term goals.  Pt is progressing very well toward mobility goals. Pt is consistently performing bed mobility at supervision level, transfers occasionally with supervision and consistently with CGA, depending on fatigue levels, and ambulating up to 21' with RW and close supervision. Pt has benefited from addition of rigid knee brace to provide stability and limit genu valgus in L lower extremity. Focus of coming week to be L lower extremity strengthening, endurance training, and DC prep.   Patient continues to demonstrate the following deficits muscle weakness, decreased cardiorespiratoy endurance and decreased standing balance and decreased balance strategies and therefore will continue to benefit from skilled PT intervention to increase functional independence with mobility.  Patient progressing toward long term goals..  Continue plan of care.  PT Short Term Goals Week 1:  PT Short Term Goal 1 (Week 1): Pt will transfer to and from [facility] with CGA. PT Short Term Goal 1 - Progress (Week 1): Met PT Short Term Goal 2 (Week 1): Pt will ambulate 93ft with CGA and RW PT Short Term Goal 2 - Progress (Week 1): Met PT Short Term Goal 3 (Week 1): Pt will perform bed mobility with supervision assist PT Short Term Goal 3 - Progress (Week 1): Met Week 2:  PT Short Term Goal 1 (Week 2): STGs = LTGs due to ELOS  Skilled Therapeutic Interventions/Progress Updates:  Ambulation/gait training;Psychosocial support;Functional mobility training;Discharge planning;Therapeutic Activities;Visual/perceptual  remediation/compensation;Wheelchair propulsion/positioning;Skin care/wound management;Therapeutic Exercise;Neuromuscular re-education;Balance/vestibular training;Disease management/prevention;Cognitive remediation/compensation;DME/adaptive equipment instruction;Pain management;Splinting/orthotics;UE/LE Strength taining/ROM;Community reintegration;Functional electrical stimulation;Patient/family education;UE/LE Hydrologist training   1st Session: Pt received seated in recliner and agrees to therapy. Daughter is present for family education. Much of session is focused on pt and family education on L hip strengthening and strategies to limit genu valgus in L lower extremity and subsequent compensations pt has made over past 4-5 years. PT demos doffing and donning L knee brace with daughter verbalizing understanding. PT also educates pt on importance of ongoing activity and finding interests to motivate pt to continue an active and fulfilling lifestyle, due to risk of functional decline considering pt's age, diagnosis, and comorbidities. Pt also encouraged to address lower leg compensations such as significant pronation and eversion, and benefits of supportive footwear.   Pt ambulates 21' with RW and close supervision, with cues to internally rotate foot for improved gait mechanics. Left seated in recliner with alarm intact and all needs within reach.  2nd session: Pt received seated in WC and agreeable to therapy. No complaint of pain. WC transport outside for time management and energy conservation.  Pt performs ambulation outside with daughter present for gait training over unlevel surfaces of varying material in open environment. Pt ambulates 52' with RW and CGA with x1 LOB backward requiring minA. Pt demos increased stride length with L lower extremity and improved control over valgus "collapse" in knee. Pt's daughter provides WC follow.  Supervision for stand step transfer back to bed  with RW, with cues on increased eccentric control on stand to sit. Left seated with all needs within reach.    Therapy Documentation Precautions:  Precautions Precautions: Fall Precaution Comments: chronic Lt knee  injury, knee brace when OOB Restrictions Weight Bearing Restrictions: No   Therapy/Group: Individual Therapy  Breck Coons, PT, DPT 11/26/2019, 3:44 PM

## 2019-11-26 NOTE — Plan of Care (Signed)
  Problem: Consults Goal: RH STROKE PATIENT EDUCATION Description: See Patient Education module for education specifics  Outcome: Progressing   Problem: RH BOWEL ELIMINATION Goal: RH STG MANAGE BOWEL WITH ASSISTANCE Description: STG Manage Bowel with mod I Assistance. Outcome: Progressing   Problem: RH BLADDER ELIMINATION Goal: RH STG MANAGE BLADDER WITH ASSISTANCE Description: STG Manage Bladder With Mod I Assistance Outcome: Progressing   Problem: RH SKIN INTEGRITY Goal: RH STG SKIN FREE OF INFECTION/BREAKDOWN Description: Skin to remain free from breakdown and infection while on rehab with mod I assistance. Outcome: Progressing Goal: RH STG MAINTAIN SKIN INTEGRITY WITH ASSISTANCE Description: STG Maintain Skin Integrity With mod I Assistance. Outcome: Progressing   Problem: RH PAIN MANAGEMENT Goal: RH STG PAIN MANAGED AT OR BELOW PT'S PAIN GOAL Description: <3 on a 0-10 pain scale. Outcome: Progressing   Problem: RH KNOWLEDGE DEFICIT Goal: RH STG INCREASE KNOWLEDGE OF HYPERTENSION Description: Patient and caregiver will be able to demonstrate knowledge of HTN medications, medication management, dietary control, BP management, and follow up care with the MD with cues and reminders from rehab staff. Outcome: Progressing Goal: RH STG INCREASE KNOWLEGDE OF HYPERLIPIDEMIA Description: Patient and caregiver will be able to demonstrate knowledge of HLD medications, medication management, dietary control, and follow up care with the MD with cues and reminders from rehab staff.  Outcome: Progressing Goal: RH STG INCREASE KNOWLEDGE OF STROKE PROPHYLAXIS Description: Patient and caregiver will be able to demonstrate knowledge of medications used to treat and prevent future strokes, medication management, dietary control, BP management, cholesterol management, and follow up care with the MD with cues and reminders from rehab staff.  Outcome: Progressing

## 2019-11-27 ENCOUNTER — Encounter (HOSPITAL_COMMUNITY): Payer: Medicare Other | Admitting: Occupational Therapy

## 2019-11-27 DIAGNOSIS — I10 Essential (primary) hypertension: Secondary | ICD-10-CM

## 2019-11-27 DIAGNOSIS — D638 Anemia in other chronic diseases classified elsewhere: Secondary | ICD-10-CM

## 2019-11-27 DIAGNOSIS — Z85038 Personal history of other malignant neoplasm of large intestine: Secondary | ICD-10-CM

## 2019-11-27 DIAGNOSIS — M62838 Other muscle spasm: Secondary | ICD-10-CM

## 2019-11-27 MED ORDER — LISINOPRIL 5 MG PO TABS
2.5000 mg | ORAL_TABLET | Freq: Every day | ORAL | Status: DC
  Administered 2019-11-27 – 2019-11-30 (×4): 2.5 mg via ORAL
  Filled 2019-11-27 (×4): qty 1

## 2019-11-27 NOTE — Progress Notes (Signed)
[place] PHYSICAL MEDICINE & REHABILITATION PROGRESS NOTE   Subjective/Complaints: Patient seen sitting up in a chair this morning.  She states she slept well overnight.  She states that she has not had a good stool, but does not want to make any changes to medications at present given her history.  She states she would like to wait 1 more day.  ROS: + Constipation. Denies CP, SOB, N/V/D  Objective:   No results found. No results for input(s): WBC, HGB, HCT, PLT in the last 72 hours. No results for input(s): NA, K, CL, CO2, GLUCOSE, BUN, CREATININE, CALCIUM in the last 72 hours.  Intake/Output Summary (Last 24 hours) at 11/27/2019 1250 Last data filed at 11/27/2019 0800 Gross per 24 hour  Intake 620 ml  Output --  Net 620 ml     Pressure Injury 11/19/19 Coccyx Bilateral Stage 1 -  Intact skin with non-blanchable redness of a localized area usually over a bony prominence. Non blanchable,red flaky skin, bilaterally (Active)  11/19/19 1456  Location: Coccyx  Location Orientation: Bilateral  Staging: Stage 1 -  Intact skin with non-blanchable redness of a localized area usually over a bony prominence.  Wound Description (Comments): Non blanchable,red flaky skin, bilaterally  Present on Admission: Yes    Physical Exam: Vital Signs Blood pressure (!) 171/55, pulse (!) 56, temperature 97.6 F (36.4 C), resp. rate 16, height 5' (1.524 m), weight 58.9 kg, SpO2 96 %. Constitutional: No distress . Vital signs reviewed. HENT: Normocephalic.  Atraumatic. Eyes: EOMI. No discharge. Cardiovascular: No JVD.  RRR. Respiratory: Normal effort.  No stridor.  Bilateral clear to auscultation. GI: Non-distended.  BS +. Skin: Warm and dry.  Sacral ulcer not examined today. Psych: Normal mood.  Normal behavior. Musc: No edema in extremities.  No tenderness in extremities. Neuro: Alert Motor: RUE/RLE: 5/5 proximal distally in LUE/LLE: 4+/5 proximal to distal  Assessment/Plan: 1. Functional  deficits secondary to Right corona radiata infarct which require 3+ hours per day of interdisciplinary therapy in a comprehensive inpatient rehab setting.  Physiatrist is providing close team supervision and 24 hour management of active medical problems listed below.  Physiatrist and rehab team continue to assess barriers to discharge/monitor patient progress toward functional and medical goals  Care Tool:  Bathing    Body parts bathed by patient: Right arm, Left arm, Chest, Abdomen, Front perineal area, Face, Left lower leg, Right lower leg, Left upper leg, Right upper leg   Body parts bathed by helper: Buttocks     Bathing assist Assist Level: Contact Guard/Touching assist     Upper Body Dressing/Undressing Upper body dressing   What is the patient wearing?: Pull over shirt    Upper body assist Assist Level: Supervision/Verbal cueing    Lower Body Dressing/Undressing Lower body dressing      What is the patient wearing?: Incontinence brief, Pants     Lower body assist Assist for lower body dressing: Maximal Assistance - Patient 25 - 49%     Toileting Toileting    Toileting assist Assist for toileting: Minimal Assistance - Patient > 75%     Transfers Chair/bed transfer  Transfers assist     Chair/bed transfer assist level: Supervision/Verbal cueing     Locomotion Ambulation   Ambulation assist      Assist level: Supervision/Verbal cueing Assistive device: Walker-rolling Max distance: 70'   Walk 10 feet activity   Assist     Assist level: Supervision/Verbal cueing Assistive device: Walker-rolling   Walk 50 feet  activity   Assist Walk 50 feet with 2 turns activity did not occur: Safety/medical concerns  Assist level: Supervision/Verbal cueing Assistive device: Walker-rolling    Walk 150 feet activity   Assist Walk 150 feet activity did not occur: Safety/medical concerns         Walk 10 feet on uneven surface  activity   Assist  Walk 10 feet on uneven surfaces activity did not occur: Safety/medical concerns         Wheelchair     Assist Will patient use wheelchair at discharge?: Yes (Per PT long term goals ) Type of Wheelchair: Manual    Wheelchair assist level: Minimal Assistance - Patient > 75% Max wheelchair distance: 150    Wheelchair 50 feet with 2 turns activity    Assist        Assist Level: Minimal Assistance - Patient > 75%   Wheelchair 150 feet activity     Assist      Assist Level: Minimal Assistance - Patient > 75%   Blood pressure (!) 171/55, pulse (!) 56, temperature 97.6 F (36.4 C), resp. rate 16, height 5' (1.524 m), weight 58.9 kg, SpO2 96 %.  Medical Problem List and Plan: 1.  Blurred vision with gait disturbance secondary to right Corona radiata infarction.  Event monitor to be arranged for outpatient  Continue CIR  Continue trial with left knee brace for genu valgus--seems to be helping 2.  Antithrombotics: -DVT/anticoagulation:  SCD             -antiplatelet therapy: Aspirin 81 mg daily and Plavix 75 mg daily x3 weeks then aspirin alone 3. Pain Management: Tylenol as needed. continue kpad for upper back muscle spasms.   Controlled on 10/23 4. Mood: Provide emotional support             -antipsychotic agents: N/A 5. Neuropsych: This patient is capable of making decisions on her own behalf. 6. Skin/Wound Care: encourage appropriate PO  7. Fluids/Electrolytes/Nutrition: encourage better PO  Encourage p.o.  Labs are ordered for Monday 8.  Hypertension.  Norvasc 10 mg daily, Tenormin 25 mg daily.    - po Hydralazine 25mg  q8H prn for SBP>180.   Lisinopril 2.5 started on 10/23, monitor kidney function 9.  Hyperlipidemia.  Lipitor 10.  History of colon cancer with colectomy.     -10/22 stools seem to be firming up  -added probiotic to bulk up stool as well as daily fiber  -appreciate RD input.   - q2 hour toileting schedule    We'll not make any changes today  per patient preference 11.  Anemia of chronic disease.    Hemoglobin 11.0 on 11/18 12.  Incidental finding of nontoxic multinodular goiter.  Follow-up outpatient ultrasound    LOS: 8 days A FACE TO FACE EVALUATION WAS PERFORMED  Iisha Soyars Lorie Phenix 11/27/2019, 12:50 PM

## 2019-11-27 NOTE — Progress Notes (Signed)
Occupational Therapy Session Note  Patient Details  Name: Cindy Smith MRN: [id number] Date of Birth: [birth year]  Today's Date: 11/27/2019 OT Group Time: 1100-1200 OT Group Time Calculation (min): 60 min  Skilled Therapeutic Interventions/Progress Updates:    Pt engaged in therapeutic w/c level dance group focusing on patient choice, UE/LE strengthening, salience, activity tolerance, and social participation. Pt was guided through various dance-based exercises involving UEs/LEs and trunk. All music was selected by group members. Emphasis placed on general strengthening and activity tolerance. Pts daughter, Lattie Haw, attended group with her. Pt participated well and followed exercise instruction throughout. She declined standing though was provided with opportunities. At end of session her daughter returned pt to the room via w/c.    Therapy Documentation Precautions:  Precautions Precautions: Fall Precaution Comments: chronic Lt knee injury, knee brace when OOB Restrictions Weight Bearing Restrictions: No Pain: no s/s pain during tx   ADL: ADL Eating: Set up Where Assessed-Eating: Bed level Grooming: Minimal assistance Where Assessed-Grooming: Sitting at sink Upper Body Bathing: Setup Where Assessed-Upper Body Bathing: Edge of bed Lower Body Bathing: Minimal assistance Where Assessed-Lower Body Bathing: Edge of bed Upper Body Dressing: Setup Where Assessed-Upper Body Dressing: Other (Comment) (sitting on BSc) Lower Body Dressing: Maximal assistance Where Assessed-Lower Body Dressing: Other (Comment) (sitting on BSC) Toileting: Minimal assistance Where Assessed-Toileting: Glass blower/designer: Therapist, music Method: Arts development officer: Ambulance person Transfer: Not assessed      Therapy/Group: Group Therapy  [employer] 11/27/2019, 12:33 PM

## 2019-11-28 ENCOUNTER — Inpatient Hospital Stay (HOSPITAL_COMMUNITY): Payer: Medicare Other | Admitting: Physical Therapy

## 2019-11-28 ENCOUNTER — Inpatient Hospital Stay (HOSPITAL_COMMUNITY): Payer: Medicare Other | Admitting: Occupational Therapy

## 2019-11-28 DIAGNOSIS — N1831 Chronic kidney disease, stage 3a: Secondary | ICD-10-CM

## 2019-11-28 MED ORDER — NUTRISOURCE FIBER PO PACK
1.0000 | PACK | Freq: Every day | ORAL | Status: DC
  Administered 2019-11-29 – 2019-12-01 (×3): 1
  Filled 2019-11-28 (×3): qty 1

## 2019-11-28 NOTE — Progress Notes (Signed)
Occupational Therapy Session Note  Patient Details  Name: Cindy Smith MRN: [id number] Date of Birth: [birth year]  Today's Date: 11/28/2019 OT Individual Time: 1000-1100 OT Individual Time Calculation (min): 60 min    Short Term Goals: Week 1:  OT Short Term Goal 1 (Week 1): Pt will don pants with Mod A using AE as needed OT Short Term Goal 2 (Week 1): Pt will complete 1/3 components of toileting with no more than Min balance assistance OT Short Term Goal 3 (Week 1): Pt will complete toilet transfer with Min A using LRAD  Skilled Therapeutic Interventions/Progress Updates:    patient seated in recliner, alert and ready for therapy session.  She denies pain at this time and requests a shower.  Daughter present for session.  SPT without AD min A to/from recliner, w/c, toilet - with RW CGA to/from shower bench, w/c, recliner.  Reviewed DME options - daughter to purchase a safety frame for toilet.  Patient completed toileting with min A.  Bathing seated on shower bench with CS/CGA in stance - she is able to reach bilateral feet by crossing legs.  Dressing completed seated in w/c - min A for LB dressing, Mod A for CM in stance, set up for UB dressing, min A to donn knee brace.  She is able to complete oral care and hair care with set up seated in w/c. She returned to recliner at close of session, call bell and tray table in reach.    Therapy Documentation Precautions:  Precautions Precautions: Fall Precaution Comments: chronic Lt knee injury, knee brace when OOB Restrictions Weight Bearing Restrictions: No  Therapy/Group: Individual Therapy  Carlos Levering 11/28/2019, 7:34 AM

## 2019-11-28 NOTE — Progress Notes (Signed)
PHYSICAL MEDICINE & REHABILITATION PROGRESS NOTE   Subjective/Complaints: Patient seen sitting up in her chair this morning.  She states she slept well overnight.  Similar conversations yesterday regarding bowels.  She is also worried about her blood thinning, reassured patient.  ROS: + Constipation.  Denies CP, SOB, N/V/D  Objective:   No results found. No results for input(s): WBC, HGB, HCT, PLT in the last 72 hours. No results for input(s): NA, K, CL, CO2, GLUCOSE, BUN, CREATININE, CALCIUM in the last 72 hours.  Intake/Output Summary (Last 24 hours) at 11/28/2019 1239 Last data filed at 11/27/2019 1324 Gross per 24 hour  Intake 120 ml  Output --  Net 120 ml     Pressure Injury 11/19/19 Coccyx Bilateral Stage 1 -  Intact skin with non-blanchable redness of a localized area usually over a bony prominence. Non blanchable,red flaky skin, bilaterally (Active)  11/19/19 1456  Location: Coccyx  Location Orientation: Bilateral  Staging: Stage 1 -  Intact skin with non-blanchable redness of a localized area usually over a bony prominence.  Wound Description (Comments): Non blanchable,red flaky skin, bilaterally  Present on Admission: Yes    Physical Exam: Vital Signs Blood pressure (!) 161/52, pulse (!) 51, temperature 98.2 F (36.8 C), temperature source Oral, resp. rate 16, height 5' (1.524 m), weight 58.9 kg, SpO2 97 %.  Constitutional: No distress . Vital signs reviewed. HENT: Normocephalic.  Atraumatic. Eyes: EOMI. No discharge. Cardiovascular: No JVD.  RRR. Respiratory: Normal effort.  No stridor.  Bilateral clear to auscultation. GI: Non-distended.  BS +. Skin: Warm and dry.  Sacral ulcer not examined today. Psych: Normal mood.  Normal behavior. Musc: No edema in extremities.  No tenderness in extremities. Neuro: Alert Motor: RUE/RLE: 5/5 proximal distally in LUE/LLE: 4+/5 proximal to distal, unchanged  Assessment/Plan: 1. Functional deficits secondary to  Right corona radiata infarct which require 3+ hours per day of interdisciplinary therapy in a comprehensive inpatient rehab setting.  Physiatrist is providing close team supervision and 24 hour management of active medical problems listed below.  Physiatrist and rehab team continue to assess barriers to discharge/monitor patient progress toward functional and medical goals  Care Tool:  Bathing    Body parts bathed by patient: Right arm, Left arm, Chest, Abdomen, Front perineal area, Face, Left lower leg, Right lower leg, Left upper leg, Right upper leg   Body parts bathed by helper: Buttocks     Bathing assist Assist Level: Contact Guard/Touching assist     Upper Body Dressing/Undressing Upper body dressing   What is the patient wearing?: Pull over shirt    Upper body assist Assist Level: Supervision/Verbal cueing    Lower Body Dressing/Undressing Lower body dressing      What is the patient wearing?: Incontinence brief, Pants     Lower body assist Assist for lower body dressing: Maximal Assistance - Patient 25 - 49%     Toileting Toileting    Toileting assist Assist for toileting: Minimal Assistance - Patient > 75%     Transfers Chair/bed transfer  Transfers assist     Chair/bed transfer assist level: Supervision/Verbal cueing     Locomotion Ambulation   Ambulation assist      Assist level: Supervision/Verbal cueing Assistive device: Walker-rolling Max distance: 70'   Walk 10 feet activity   Assist     Assist level: Supervision/Verbal cueing Assistive device: Walker-rolling   Walk 50 feet activity   Assist Walk 50 feet with 2 turns activity did not occur:  Safety/medical concerns  Assist level: Supervision/Verbal cueing Assistive device: Walker-rolling    Walk 150 feet activity   Assist Walk 150 feet activity did not occur: Safety/medical concerns         Walk 10 feet on uneven surface  activity   Assist Walk 10 feet on  uneven surfaces activity did not occur: Safety/medical concerns         Wheelchair     Assist Will patient use wheelchair at discharge?: Yes (Per PT long term goals ) Type of Wheelchair: Manual    Wheelchair assist level: Minimal Assistance - Patient > 75% Max wheelchair distance: 150    Wheelchair 50 feet with 2 turns activity    Assist        Assist Level: Minimal Assistance - Patient > 75%   Wheelchair 150 feet activity     Assist      Assist Level: Minimal Assistance - Patient > 75%   Blood pressure (!) 161/52, pulse (!) 51, temperature 98.2 F (36.8 C), temperature source Oral, resp. rate 16, height 5' (1.524 m), weight 58.9 kg, SpO2 97 %.  Medical Problem List and Plan: 1.  Blurred vision with gait disturbance secondary to right Corona radiata infarction.  Event monitor to be arranged for outpatient  Continue CIR  Continue trial with left knee brace for genu valgus--seems to be helping 2.  Antithrombotics: -DVT/anticoagulation:  SCD             -antiplatelet therapy: Aspirin 81 mg daily and Plavix 75 mg daily x3 weeks then aspirin alone 3. Pain Management: Tylenol as needed. continue kpad for upper back muscle spasms.   Controlled on 10/24 4. Mood: Provide emotional support             -antipsychotic agents: N/A 5. Neuropsych: This patient is capable of making decisions on her own behalf. 6. Skin/Wound Care: encourage appropriate PO  7. Fluids/Electrolytes/Nutrition: encourage better PO  Encourage p.o.  Labs are ordered for Monday 8.  Hypertension.  Norvasc 10 mg daily, Tenormin 25 mg daily.    - po Hydralazine 25mg  q8H prn for SBP>180.   Lisinopril 2.5 started on 10/23, monitor kidney function  Elevated, but improving on 10/24 9.  Hyperlipidemia.  Lipitor 10.  History of colon cancer with colectomy.     -10/22 stools seem to be firming up  -added probiotic to bulk up stool as well as daily fiber, decreased to daily on 10/24  -appreciate RD  input.   - q2 hour toileting schedule   11.  Anemia of chronic disease.    Hemoglobin 11.0 on 11/18 12.  Incidental finding of nontoxic multinodular goiter.  Follow-up outpatient ultrasound  13.?  CKD  Creatinine 1.08 on 10/20    LOS: 9 days A FACE TO FACE EVALUATION WAS PERFORMED  Cindy Smith 11/28/2019, 12:39 PM

## 2019-11-28 NOTE — Progress Notes (Signed)
Physical Therapy Session Note  Patient Details  Name: Cindy Smith MRN: [id number] Date of Birth: 1923-11-17  Today's Date: 11/28/2019 PT Individual Time: 1300-1400 PT Individual Time Calculation (min): 60 min   Short Term Goals: Week 1:  PT Short Term Goal 1 (Week 1): Pt will transfer to and from [facility] with CGA. PT Short Term Goal 1 - Progress (Week 1): Met PT Short Term Goal 2 (Week 1): Pt will ambulate 47ft with CGA and RW PT Short Term Goal 2 - Progress (Week 1): Met PT Short Term Goal 3 (Week 1): Pt will perform bed mobility with supervision assist PT Short Term Goal 3 - Progress (Week 1): Met  Skilled Therapeutic Interventions/Progress Updates:  Pt was seen bedside in the pm. Pt transferred recliner to w/c with min to mod A without assistive device. Pt transported to gym. Pt transferred multiple times sit to stand with rolling walker and S with verbal cues. While standing worked on posture and upright position. Pt ambulated 50 feet with rolling walker and c/g. Pt returned to room and left sitting up in w/c with daughter at bedside.   Therapy Documentation Precautions:  Precautions Precautions: Fall Precaution Comments: chronic Lt knee injury, knee brace when OOB Restrictions Weight Bearing Restrictions: No General:   Pain: No c/o pain.  Therapy/Group: Individual Therapy  Dub Amis 11/28/2019, 3:14 PM

## 2019-11-29 ENCOUNTER — Inpatient Hospital Stay (HOSPITAL_COMMUNITY): Payer: Medicare Other | Admitting: Occupational Therapy

## 2019-11-29 ENCOUNTER — Inpatient Hospital Stay (HOSPITAL_COMMUNITY): Payer: Medicare Other

## 2019-11-29 LAB — BASIC METABOLIC PANEL
Anion gap: 8 (ref 5–15)
BUN: 33 mg/dL — ABNORMAL HIGH (ref 8–23)
CO2: 24 mmol/L (ref 22–32)
Calcium: 9.9 mg/dL (ref 8.9–10.3)
Chloride: 103 mmol/L (ref 98–111)
Creatinine, Ser: 1.42 mg/dL — ABNORMAL HIGH (ref 0.44–1.00)
GFR, Estimated: 34 mL/min — ABNORMAL LOW (ref >60)
Glucose, Bld: 121 mg/dL — ABNORMAL HIGH (ref 70–99)
Potassium: 4.2 mmol/L (ref 3.5–5.1)
Sodium: 135 mmol/L (ref 135–145)

## 2019-11-29 LAB — CBC
HCT: 36.2 % (ref 36.0–46.0)
Hemoglobin: 11.7 g/dL — ABNORMAL LOW (ref 12.0–15.0)
MCH: 30.8 pg (ref 26.0–34.0)
MCHC: 32.3 g/dL (ref 30.0–36.0)
MCV: 95.3 fL (ref 80.0–100.0)
Platelets: 284 10*3/uL (ref 150–400)
RBC: 3.8 MIL/uL — ABNORMAL LOW (ref 3.87–5.11)
RDW: 13.7 % (ref 11.5–15.5)
WBC: 7.9 10*3/uL (ref 4.0–10.5)
nRBC: 0 % (ref 0.0–0.2)

## 2019-11-29 NOTE — Progress Notes (Signed)
Occupational Therapy Session Note  Patient Details  Name: Cindy Smith MRN: [id number] Date of Birth: Jul 16, 1923  Today's Date: 11/29/2019 OT Individual Time: 6568-1275 OT Individual Time Calculation (min): 60 min    Short Term Goals: Week 1:  OT Short Term Goal 1 (Week 1): Pt will don pants with Mod A using AE as needed OT Short Term Goal 2 (Week 1): Pt will complete 1/3 components of toileting with no more than Min balance assistance OT Short Term Goal 3 (Week 1): Pt will complete toilet transfer with Min A using LRAD  Skilled Therapeutic Interventions/Progress Updates:    Pt received in recliner and stated she already got bathed and dressed to be ready for this session.  Reminded her that we can work on these task as part of OT.     Pt donned jacket with set up and min cues for donning L knee brace.  Pt does best by leaning back to fully lift her L thigh to position the brace well.  She will benefit from more practice.    Pt preparing to get up from recliner to wc when she stated she needed to toilet again. Pt ambulated with RW to toilet with S and did well with clothing management.  She had a BM and had to wipe numerous times.   She then had to use wet cloths to ensure she was fully cleansed. Recommended she use wet wipes at home. Pt ambulated to wc in room with S with rW.   Discussed her use of a rollator in the the kitchen at home. She is quite determined to keep her same system of using the rollator to transport items in the kitchen, position it when standing at the sink for support, and moving from her table to the counter.    Discussed practicing this with one of our rollators. Pt taken to kitchen and demonstrated to me how she moves around with the rollator. She leans her forearms on the handles (she feels more supported this way) and does fairly well locking and unlocking the brakes and positioning the walker to support her.  More practice will be beneficial.  At this  point, changing her "routine" to a different ambulation strategy may not be the most beneficial thing for her.  She states she does feel stronger today.   Pt returned to her room and sat in wc with her daughter present. Updated her daughter on what we just worked on.    Pt in room with all needs met.  Therapy Documentation Precautions:  Precautions Precautions: Fall Precaution Comments: chronic Lt knee injury, knee brace when OOB Restrictions Weight Bearing Restrictions: No      Pain: Pain Assessment Pain Scale: 0-10 Pain Score: 0-No pain Pain Type: Acute pain Pain Location: Generalized ADL: ADL Eating: Set up Where Assessed-Eating: Bed level Grooming: Minimal assistance Where Assessed-Grooming: Sitting at sink Upper Body Bathing: Setup Where Assessed-Upper Body Bathing: Edge of bed Lower Body Bathing: Minimal assistance Where Assessed-Lower Body Bathing: Edge of bed Upper Body Dressing: Setup Where Assessed-Upper Body Dressing: Other (Comment) (sitting on BSc) Lower Body Dressing: Maximal assistance Where Assessed-Lower Body Dressing: Other (Comment) (sitting on BSC) Toileting: Minimal assistance Where Assessed-Toileting: Glass blower/designer: Therapist, music Method: Arts development officer: Energy manager: Not assessed  Therapy/Group: Individual Therapy  [place] 11/29/2019, 12:33 PM

## 2019-11-29 NOTE — Progress Notes (Signed)
Spoke with Algis Liming PA regarding patient going to cafeteria with daughter for lunch due to smell in room.  Maintenance is working on sink.  Algis Liming PA gave permission and spoke with the daughter.

## 2019-11-29 NOTE — Progress Notes (Signed)
Physical Therapy Session Note  Patient Details  Name: Cindy Smith MRN: [id number] Date of Birth: 1923/04/15  Today's Date: 11/29/2019 PT Individual Time: 1105-1200 PT Individual Time Calculation (min): 55 min   Short Term Goals: Week 2:  PT Short Term Goal 1 (Week 2): STGs = LTGs due to ELOS  Skilled Therapeutic Interventions/Progress Updates:     Pt received seated in [facility] and agrees to therapy. No complaint of pain. WC transport to dayroom for time management. Pt verbalizes wanting to practices with rollator because she uses a rollator in her kitchen at home, using the seat as a way to transport objects from one place to another. PT adjusts rollator to pt's height prior to ambulation. Sit to stand with supervision. Pt ambulates 30' with close supervision and cues for body mechanics, especially relative to Left Lower extremity and preventing valgus collapse in knee. Pt takes seated rest break and then ambulates 50' with rollator through obstacle course, going around cones and between targets. No LOBs but pt appears fatigued and verbalizes muscular fatigue/discomfort between scapulae.   Following extended seated rest break, pt performs x15 bilateral upper extremity shoulder presses with 5 lb bar. Pt then performs x15 trunk rotations holding onto 5lb bar with extended elbows in front of trunk. Performed for core strengthening and balance.  Pt left seated in recliner with alarm intact and all needs within reach.  Therapy Documentation Precautions:  Precautions Precautions: Fall Precaution Comments: chronic Lt knee injury, knee brace when OOB Restrictions Weight Bearing Restrictions: No    Therapy/Group: Individual Therapy  Breck Coons, PT, DPT 11/29/2019, 3:01 PM

## 2019-11-29 NOTE — Progress Notes (Signed)
[place] PHYSICAL MEDICINE & REHABILITATION PROGRESS NOTE   Subjective/Complaints: Really no new issues today. Stools more formed. Doing better adjusting to routine here. Denies pain.   ROS: Patient denies fever, rash, sore throat, blurred vision, nausea, vomiting, diarrhea, cough, shortness of breath or chest pain, joint or back pain, headache, or mood change.   Objective:   No results found. Recent Labs    11/29/19 0927  WBC 7.9  HGB 11.7*  HCT 36.2  PLT 284   Recent Labs    11/29/19 0927  NA 135  K 4.2  CL 103  CO2 24  GLUCOSE 121*  BUN 33*  CREATININE 1.42*  CALCIUM 9.9    Intake/Output Summary (Last 24 hours) at 11/29/2019 1227 Last data filed at 11/29/2019 0900 Gross per 24 hour  Intake 458 ml  Output --  Net 458 ml     Pressure Injury 11/19/19 Coccyx Bilateral Stage 1 -  Intact skin with non-blanchable redness of a localized area usually over a bony prominence. Non blanchable,red flaky skin, bilaterally (Active)  11/19/19 1456  Location: Coccyx  Location Orientation: Bilateral  Staging: Stage 1 -  Intact skin with non-blanchable redness of a localized area usually over a bony prominence.  Wound Description (Comments): Non blanchable,red flaky skin, bilaterally  Present on Admission: Yes    Physical Exam: Vital Signs Blood pressure (!) 147/43, pulse (!) 50, temperature 97.9 F (36.6 C), temperature source Oral, resp. rate 16, height 5' (1.524 m), weight 58.9 kg, SpO2 96 %.  Constitutional: No distress . Vital signs reviewed. HEENT: EOMI, oral membranes moist Neck: supple Cardiovascular: RRR without murmur. No JVD    Respiratory/Chest: CTA Bilaterally without wheezes or rales. Normal effort    GI/Abdomen: BS +, non-tender, non-distended Ext: no clubbing, cyanosis, or edema Psych: pleasant and cooperative Skin: sacrum sl red Musc: No edema in extremities.  No tenderness in extremities. Neuro: Alert, fair insight. Mild STM deficits Motor: RUE/RLE:  5/5 proximal distally in LUE/LLE: 4 to 4+/5 proximal to distally. Sensory norma.   Assessment/Plan: 1. Functional deficits secondary to Right corona radiata infarct which require 3+ hours per day of interdisciplinary therapy in a comprehensive inpatient rehab setting.  Physiatrist is providing close team supervision and 24 hour management of active medical problems listed below.  Physiatrist and rehab team continue to assess barriers to discharge/monitor patient progress toward functional and medical goals  Care Tool:  Bathing    Body parts bathed by patient: Right arm, Left arm, Chest, Abdomen, Front perineal area, Face, Left lower leg, Right lower leg, Left upper leg, Right upper leg   Body parts bathed by helper: Buttocks     Bathing assist Assist Level: Contact Guard/Touching assist     Upper Body Dressing/Undressing Upper body dressing   What is the patient wearing?: Pull over shirt    Upper body assist Assist Level: Supervision/Verbal cueing    Lower Body Dressing/Undressing Lower body dressing      What is the patient wearing?: Incontinence brief, Pants     Lower body assist Assist for lower body dressing: Maximal Assistance - Patient 25 - 49%     Toileting Toileting    Toileting assist Assist for toileting: Minimal Assistance - Patient > 75%     Transfers Chair/bed transfer  Transfers assist     Chair/bed transfer assist level: Supervision/Verbal cueing     Locomotion Ambulation   Ambulation assist      Assist level: Contact Guard/Touching assist Assistive device: Walker-rolling Max distance:  50   Walk 10 feet activity   Assist     Assist level: Contact Guard/Touching assist Assistive device: Walker-rolling   Walk 50 feet activity   Assist Walk 50 feet with 2 turns activity did not occur: Safety/medical concerns  Assist level: Contact Guard/Touching assist Assistive device: Walker-rolling    Walk 150 feet activity   Assist  Walk 150 feet activity did not occur: Safety/medical concerns         Walk 10 feet on uneven surface  activity   Assist Walk 10 feet on uneven surfaces activity did not occur: Safety/medical concerns         Wheelchair     Assist Will patient use wheelchair at discharge?: Yes (Per PT long term goals ) Type of Wheelchair: Manual    Wheelchair assist level: Minimal Assistance - Patient > 75% Max wheelchair distance: 150    Wheelchair 50 feet with 2 turns activity    Assist        Assist Level: Minimal Assistance - Patient > 75%   Wheelchair 150 feet activity     Assist      Assist Level: Minimal Assistance - Patient > 75%   Blood pressure (!) 147/43, pulse (!) 50, temperature 97.9 F (36.6 C), temperature source Oral, resp. rate 16, height 5' (1.524 m), weight 58.9 kg, SpO2 96 %.  Medical Problem List and Plan: 1.  Blurred vision with gait disturbance secondary to right Corona radiata infarction.  Event monitor to be arranged for outpatient  Continue CIR  ELOS 10/27  Continue trial with left knee brace for genu valgus--seems to be helping. Left knee is as much a problem as anything right now 2.  Antithrombotics: -DVT/anticoagulation:  SCD             -antiplatelet therapy: Aspirin 81 mg daily and Plavix 75 mg daily x3 weeks then aspirin alone 3. Pain Management: Tylenol as needed. continue kpad for upper back muscle spasms.   Controlled on 10/25 4. Mood: Provide emotional support             -antipsychotic agents: N/A 5. Neuropsych: This patient is capable of making decisions on her own behalf. 6. Skin/Wound Care: encourage appropriate PO  7. Fluids/Electrolytes/Nutrition: encourage better PO  Encourage p.o.  Labs are ordered for Monday 8.  Hypertension.  Norvasc 10 mg daily, Tenormin 25 mg daily.    - po Hydralazine 25mg  q8H prn for SBP>180.   Lisinopril 2.5 started on 10/23, monitor kidney function  10/25 IMPROVED control  -recheck Cr  Wednesday 9.  Hyperlipidemia.  Lipitor 10.  History of colon cancer with colectomy.     -10/25 fiber/probiotic  -appreciate RD input.   - q2 hour toileting schedule    -stool more formed 11.  Anemia of chronic disease.    Hemoglobin 11.0 on 11/18 12.  Incidental finding of nontoxic multinodular goiter.  Follow-up outpatient ultrasound  13.?  CKD  Creatinine sl elevated today again 1.42---recheck Wednesday    LOS: 10 days A FACE TO FACE EVALUATION WAS PERFORMED  Meredith Staggers 11/29/2019, 12:27 PM

## 2019-11-29 NOTE — Progress Notes (Signed)
Occupational Therapy Session Note  Patient Details  Name: Cindy Smith MRN: [id number] Date of Birth: 05-May-1923  Today's Date: 11/29/2019 OT Individual Time: 1430-1545 OT Individual Time Calculation (min): 75 min    Short Term Goals: Week 1:  OT Short Term Goal 1 (Week 1): Pt will don pants with Mod A using AE as needed OT Short Term Goal 2 (Week 1): Pt will complete 1/3 components of toileting with no more than Min balance assistance OT Short Term Goal 3 (Week 1): Pt will complete toilet transfer with Min A using LRAD  Skilled Therapeutic Interventions/Progress Updates:    Patient seated in w/c, ready for PM session.  No pain noted at this time.  Daughter present for session.  Reviewed and practiced doff/donn of knee brace - she was able to complete on second attempt with CS and min cues.  Reviewed and practiced reach and transport of items in kitchen environment.  Discussed placement of items for increased efficiency/energy conservation.  Discussed use of rollator vs RW and determined that RW allowed for improved body mechanics and less need to reach outside of BOS.  Reviewed furniture placement and options to improve access to table and chairs.  She demonstrated ability to access sink, fill coffee pot and complete transfer to chair without arms at kitchen table with CGA and min cues.  Patient and daughter verbalize understanding of topics discussed.  SPT to/from w/c, toilet and bed with RW CGA.  toileting completed with mod A for hygiene after BM and pants up due to fatigue.  She returned to bed at close of session, min A for sit to supine.  Bed alarm set and call bell in reach.    Therapy Documentation Precautions:  Precautions Precautions: Fall Precaution Comments: chronic Lt knee injury, knee brace when OOB Restrictions Weight Bearing Restrictions: No   Therapy/Group: Individual Therapy  Carlos Levering 11/29/2019, 7:40 AM

## 2019-11-29 NOTE — Progress Notes (Addendum)
Patient ID: Cindy Smith, female   DOB: 09-23-1923, 86 y.o.   MRN: [id number]  SW spoke with pt dtr Lattie Haw to discuss H[facility] preference, and inform on DME: junior RW to be ordered. Preferred [facility] is [facility]. SW sent HHPT/OT/SLP/aide order to [facility] and DME order to [facility] via parachute.   *HH referral accepted by [facility].  Loralee Pacas, MSW, [place] Office: [phone] Cell: [phone] Fax: [phone]

## 2019-11-30 ENCOUNTER — Inpatient Hospital Stay (HOSPITAL_COMMUNITY): Payer: Medicare Other | Admitting: Occupational Therapy

## 2019-11-30 ENCOUNTER — Inpatient Hospital Stay (HOSPITAL_COMMUNITY): Payer: Medicare Other

## 2019-11-30 ENCOUNTER — Inpatient Hospital Stay (HOSPITAL_COMMUNITY): Payer: Medicare Other | Admitting: Physical Therapy

## 2019-11-30 LAB — BASIC METABOLIC PANEL
Anion gap: 11 (ref 5–15)
BUN: 34 mg/dL — ABNORMAL HIGH (ref 8–23)
CO2: 22 mmol/L (ref 22–32)
Calcium: 9.9 mg/dL (ref 8.9–10.3)
Chloride: 101 mmol/L (ref 98–111)
Creatinine, Ser: 1.29 mg/dL — ABNORMAL HIGH (ref 0.44–1.00)
GFR, Estimated: 39 mL/min — ABNORMAL LOW (ref >60)
Glucose, Bld: 89 mg/dL (ref 70–99)
Potassium: 4.4 mmol/L (ref 3.5–5.1)
Sodium: 134 mmol/L — ABNORMAL LOW (ref 135–145)

## 2019-11-30 NOTE — Progress Notes (Signed)
Physical Therapy Discharge Summary  Patient Details  Name: Cindy Smith MRN: [id number] Date of Birth: November 28, 1923  Today's Date: 11/30/2019 PT Individual Time: 1105-1202 PT Individual Time Calculation (min): 57 min    Patient has met 11 of 11 long term goals due to improved activity tolerance, improved balance and increased strength.  Patient to discharge at an ambulatory level Modified Independent.   Patient's daughter attended family ed andis independent to provide physicalassistance if needed at discharge.  Reasons goals not met: NA  Recommendation:  Patient will benefit from ongoing skilled PT services in home health setting to continue to advance safe functional mobility, address ongoing impairments in strength, balance, ambulation, and minimize fall risk.  Equipment: Junior RW  Reasons for discharge: treatment goals met and discharge from hospital  Patient/family agrees with progress made and goals achieved: Yes   Skilled therapeutic Interventions:  Pt received seated in [facility] and agrees to therapy> no complaint of pain. WC transport to gym for time management. Car transfer performed with supervision and cues for hand placement and sequencing. Pt ascends/descends ramp with RW at mod(I). Seated rest break prior to performing stand pivot transfer to mat table with mod(I) and sit<>supine independently. Pt completes x2 3" steps with RW and supervision for cues on sequencing, RW management, and foot placement. Pt self propels WC x100' at [facility]). Seated rest break and pt ambulates 20' with RW and mod(I). PT educates pt and daughter on proper placement of knee brace. Pt performs stand pivot transfer to recliner with mod(I). Left seated in recliner with all needs within reach.  PT Discharge Precautions/Restrictions Precautions Precautions: Fall Precaution Comments: chronic Lt knee injury, knee brace when OOB Restrictions Weight Bearing Restrictions: No  Vision/Perception   Perception Perception: Within Functional Limits Praxis Praxis: Intact  Cognition Overall Cognitive Status: Within Functional Limits for tasks assessed Arousal/Alertness: Awake/alert Orientation Level: Oriented X4 Safety/Judgment: Appears intact Sensation Sensation Light Touch: Appears Intact Additional Comments: able to detect all stimuli, reports baseline neuropathy Coordination Gross Motor Movements are Fluid and Coordinated: Yes Fine Motor Movements are Fluid and Coordinated: Yes Motor  Motor Motor: Within Functional Limits  Mobility Bed Mobility Bed Mobility: Rolling Right;Rolling Left;Sit to Supine;Supine to Sit Rolling Right: Independent Rolling Left: Independent Supine to Sit: Independent Sit to Supine: Independent Transfers Transfers: Sit to Stand;Stand Pivot Transfers;Stand to Sit Sit to Stand: Independent with assistive device Stand to Sit: Independent with assistive device Stand Pivot Transfers: Independent with assistive device Transfer (Assistive device): Rolling walker Locomotion  Gait Ambulation: Yes Gait Assistance: Independent with assistive device Gait Distance (Feet): 55 Feet Assistive device: Rolling walker Gait Gait: Yes Gait Pattern: Impaired Gait Pattern: Lateral hip instability;Poor foot clearance - right Gait velocity: Decreased Stairs / Additional Locomotion Stairs: Yes Stairs Assistance: Supervision/Verbal cueing Stair Management Technique: With walker Number of Stairs: 2 Height of Stairs: 3 Ramp: Independent with assistive device Curb: Supervision/Verbal cueing Wheelchair Mobility Wheelchair Mobility: Yes Wheelchair Assistance: Independent with Camera operator: Both upper extremities Wheelchair Parts Management: Supervision/cueing Distance: 100'  Trunk/Postural Assessment  Cervical Assessment Cervical Assessment: Exceptions to [facility] (forward head) Thoracic Assessment Thoracic Assessment: Exceptions to [facility]  (rounded shoulders) Lumbar Assessment Lumbar Assessment: Exceptions to [facility] (posterior pelvic tilt) Postural Control Postural Control: Within Functional Limits  Balance Balance Balance Assessed: Yes Dynamic Sitting Balance Dynamic Sitting - Level of Assistance: 7: Independent Static Standing Balance Static Standing - Balance Support: Bilateral upper extremity supported Static Standing - Level of Assistance: 6: Modified independent (Device/Increase time) Dynamic  Standing Balance Dynamic Standing - Balance Support: Bilateral upper extremity supported Dynamic Standing - Level of Assistance: 6: Modified independent (Device/Increase time) Extremity Assessment  RLE Assessment RLE Assessment: Within Functional Limits LLE Assessment LLE Assessment: Exceptions to [facility] General Strength Comments: Grossly 3+/5    Breck Coons, PT, DPT 11/30/2019, 12:43 PM

## 2019-11-30 NOTE — Progress Notes (Signed)
Occupational Therapy Session Note  Patient Details  Name: Cindy Smith MRN: [id number] Date of Birth: September 01, 1923  Today's Date: 11/30/2019 OT Individual Time: 0920-1030 OT Individual Time Calculation (min): 70 min    Short Term Goals: Week 1:  OT Short Term Goal 1 (Week 1): Pt will don pants with Mod A using AE as needed OT Short Term Goal 2 (Week 1): Pt will complete 1/3 components of toileting with no more than Min balance assistance OT Short Term Goal 3 (Week 1): Pt will complete toilet transfer with Min A using LRAD  Skilled Therapeutic Interventions/Progress Updates:    Pt seen for ADL training along with family education with her daughter. Given extra time, pt did extremely well today completing almost all tasks with mod independence using her RW for support.  Pt agreeable to a shower. Ambulated to bathroom to sit on toilet to undress. Pt needed to have a BM. She is able to cleanse herself but has difficulty managing without the use of wet wipes and often gets the toilet seat dirty. Had a discussion with the pt and her daughter on strategies to make this easier at home. For now, pt should have S with this task to ensure good hygiene. Her daughter will be staying with her for at least a week and a half, so they will work on that together.    For getting in and out of the shower, pt will always have S as she had that before. For bathing, dressing, donning Knee brace, standing at sink for oral care, pt is mod I.  Pt resting in wc at end of session with all needs met.  Daughter in room with pt.    Therapy Documentation Precautions:  Precautions Precautions: Fall Precaution Comments: chronic Lt knee injury, knee brace when OOB Restrictions Weight Bearing Restrictions: No    Vital Signs: Therapy Vitals BP: (!) 144/47 Pain: Pain Assessment Pain Scale: 0-10 Pain Score: 0-No pain ADL: ADL Eating: Set up Where Assessed-Eating: Bed level Grooming: Minimal  assistance Where Assessed-Grooming: Sitting at sink Upper Body Bathing: Setup Where Assessed-Upper Body Bathing: Edge of bed Lower Body Bathing: Minimal assistance Where Assessed-Lower Body Bathing: Edge of bed Upper Body Dressing: Setup Where Assessed-Upper Body Dressing: Other (Comment) (sitting on BSc) Lower Body Dressing: Maximal assistance Where Assessed-Lower Body Dressing: Other (Comment) (sitting on BSC) Toileting: Minimal assistance Where Assessed-Toileting: Glass blower/designer: Therapist, music Method: Arts development officer: Energy manager: Not assessed   Therapy/Group: Individual Therapy  [place] 11/30/2019, 10:44 AM

## 2019-11-30 NOTE — Discharge Summary (Signed)
Physician Discharge Summary  Patient ID: Cindy Smith MRN: [id number] DOB/AGE: 1923/10/04 86 y.o.  Admit date: 11/19/2019 Discharge date: 12/01/2019  Discharge Diagnoses:  Principal Problem:   Infarction of right basal ganglia (HCC) Active Problems:   Pressure injury of skin   Anemia of chronic disease   History of colon cancer   Essential hypertension   Muscle spasm   Stage 3a chronic kidney disease (HCC) Hyperlipidemia History of colon cancer with colectomy Incidental finding of nontoxic multinodular goiter  Discharged Condition: Stable  Significant Diagnostic Studies: CT HEAD WO CONTRAST  Result Date: 11/17/2019 CLINICAL DATA:  Acute neuro deficit.  Rule out stroke.  Weakness. EXAM: CT HEAD WITHOUT CONTRAST TECHNIQUE: Contiguous axial images were obtained from the base of the skull through the vertex without intravenous contrast. COMPARISON:  None. FINDINGS: Brain: Mild atrophy. Extensive white matter hypodensity diffusely bilaterally. Negative for acute cortical infarct. Negative for hemorrhage or mass. Vascular: Negative for hyperdense vessel Skull: Negative Sinuses/Orbits: Air-fluid level sphenoid sinus. Remaining sinuses clear. Mastoid clear bilaterally. Bilateral cataract extraction. Other: Multiple skin lesions in the scalp. Calcified dermal lesion right parietal scalp most likely Pilar cyst. IMPRESSION: Atrophy with diffuse white matter disease most likely chronic microvascular ischemia No acute abnormality. Electronically Signed   By: Franchot Gallo M.D.   On: 11/17/2019 14:52   MR BRAIN WO CONTRAST  Result Date: 11/17/2019 CLINICAL DATA:  86 year old female with acute weakness, imbalance. EXAM: MRI HEAD WITHOUT CONTRAST TECHNIQUE: Multiplanar, multiecho pulse sequences of the brain and surrounding structures were obtained without intravenous contrast. COMPARISON:  Head CT earlier today. FINDINGS: Brain: Linear, patchy restricted diffusion in the right corona  radiata tracking toward the external capsule (series 5, image 75 and series 7, image 62). No other restricted diffusion. Underlying Patchy and confluent bilateral cerebral white matter T2 and FLAIR hyperintensity. Similar signal heterogeneity throughout the bilateral basal ganglia, worse on the right. Occasional chronic micro hemorrhages (left periatrial white matter series 14, image 29). No evidence of acute hemorrhage. No mass effect. No midline shift, evidence of mass lesion, ventriculomegaly, extra-axial collection. Cervicomedullary junction and pituitary are within normal limits. Mild for age signal heterogeneity in the pons. No cortical encephalomalacia identified. Vascular: Major intracranial vascular flow voids are preserved. Skull and upper cervical spine: Cervical spine detailed separately today. Normal visible bone marrow signal. Sinuses/Orbits: Negative, postoperative changes to both globes. Other: Mastoids are well pneumatized. Visible internal auditory structures appear normal. Scalp and face appear negative. IMPRESSION: 1. Acute small vessel infarct in the right corona radiata, external capsule. No associated hemorrhage or mass effect. 2. Underlying moderate to advanced chronic small vessel disease. Electronically Signed   By: Genevie Ann M.D.   On: 11/17/2019 18:44   MR Cervical Spine Wo Contrast  Result Date: 11/17/2019 CLINICAL DATA:  86 year old female with acute weakness, imbalance. EXAM: MRI CERVICAL SPINE WITHOUT CONTRAST TECHNIQUE: Multiplanar, multisequence MR imaging of the cervical spine was performed. No intravenous contrast was administered. COMPARISON:  Brain MRI today reported separately. FINDINGS: Alignment: Mild degenerative anterolisthesis of C2 on C3, C7 on T1. Overall straightening of cervical lordosis. Vertebrae: Widespread degenerative endplate marrow signal changes in the cervical spine. No marrow edema or evidence of acute osseous abnormality. Benign vertebral body hemangioma  at T2 on the right. Cord: No cervical spinal cord signal abnormality despite multilevel degenerative stenosis with some cord mass effect detailed below. Visible upper thoracic cord appears normal. Posterior Fossa, vertebral arteries, paraspinal tissues: Cervicomedullary junction is within normal limits. Brain is detailed separately  today. Preserved major vascular flow voids in the neck. Thyromegaly (series 7, image 6 on the right, but no follow-up indicated in this age group (ref: J Am Coll Radiol. 2015 Feb;12(2): 143-50). Disc levels: C2-C3: Anterolisthesis associated with moderate left and severe right facet hypertrophy. Ligament flavum hypertrophy. No spinal stenosis. Moderate to severe bilateral C3 foraminal stenosis greater on the right. C3-C4: Severe disc space loss. Circumferential disc osteophyte complex. Mild to moderate facet and ligament flavum hypertrophy. Mild spinal stenosis. Mild if any cord mass effect. Severe bilateral C4 foraminal stenosis. C4-C5: Severe disc space loss. Circumferential disc osteophyte complex. Mild to moderate posterior element hypertrophy. Mild spinal stenosis and spinal cord mass effect. Severe bilateral C5 foraminal stenosis. C5-C6: Disc space loss with circumferential disc osteophyte complex. Mild to moderate posterior element hypertrophy. No significant spinal stenosis. Moderate left and moderate to severe right C6 foraminal stenosis. C6-C7: Mild disc space loss. Circumferential disc osteophyte complex. Moderate facet and ligament flavum hypertrophy. No significant spinal stenosis. Moderate bilateral C7 foraminal stenosis. C7-T1: Mild anterolisthesis. Mild to moderate posterior element hypertrophy. No significant stenosis. IMPRESSION: 1. No acute osseous abnormality in the cervical spine. Mild degenerative spondylolisthesis at C2-C3 and C7-T1 with facet arthropathy. 2. Widespread cervical spine degeneration. Mild spinal stenosis at C3-C4 and C4-C5 with up to mild spinal cord  mass effect, but no cord signal abnormality. 3. Widespread moderate and severe degenerative neural foraminal stenosis, only sparing the C8 nerve level. Electronically Signed   By: Genevie Ann M.D.   On: 11/17/2019 18:56   ECHOCARDIOGRAM COMPLETE  Result Date: 11/18/2019    ECHOCARDIOGRAM REPORT   Patient Name:   MARLY SCHULD Date of Exam: 11/18/2019 Medical Rec #:  [id number]            Height:       60.0 in Accession #:    [id number]           Weight:       130.3 lb Date of Birth:  1923-06-17           BSA:          1.556 m Patient Age:    12 years             BP:           163/71 mmHg Patient Gender: F                    HR:           68 bpm. Exam Location:  Inpatient Procedure: 2D Echo, Cardiac Doppler and Color Doppler Indications:    Stroke 434.91 / I163.9  History:        Patient has no prior history of Echocardiogram examinations.                 Risk Factors:Hypertension and Non-Smoker.  Sonographer:    Vickie Epley RDCS Referring Phys: [id number] [place]  1. Left ventricular ejection fraction, by estimation, is 65 to 70%. The left ventricle has normal function. The left ventricle has no regional wall motion abnormalities. Left ventricular diastolic parameters are consistent with Grade II diastolic dysfunction (pseudonormalization). Elevated left atrial pressure.  2. Right ventricular systolic function is normal. The right ventricular size is normal.  3. Left atrial size was mild to moderately dilated.  4. Right atrial size was mildly dilated.  5. The mitral valve is normal in structure. Mild mitral valve regurgitation. No evidence of mitral stenosis. Moderate mitral annular  calcification.  6. The aortic valve is normal in structure. There is moderate calcification of the aortic valve. There is moderate thickening of the aortic valve. Aortic valve regurgitation is mild. Mild aortic valve stenosis. Aortic valve mean gradient measures 10.0 mmHg.  7. The inferior vena cava is normal in  size with greater than 50% respiratory variability, suggesting right atrial pressure of 3 mmHg. Conclusion(s)/Recommendation(s): No intracardiac source of embolism detected on this transthoracic study. A transesophageal echocardiogram is recommended to exclude cardiac source of embolism if clinically indicated. FINDINGS  Left Ventricle: Left ventricular ejection fraction, by estimation, is 65 to 70%. The left ventricle has normal function. The left ventricle has no regional wall motion abnormalities. The left ventricular internal cavity size was normal in size. There is  no left ventricular hypertrophy. Left ventricular diastolic parameters are consistent with Grade II diastolic dysfunction (pseudonormalization). Elevated left atrial pressure. Right Ventricle: The right ventricular size is normal. No increase in right ventricular wall thickness. Right ventricular systolic function is normal. Left Atrium: Left atrial size was mild to moderately dilated. Right Atrium: Right atrial size was mildly dilated. Pericardium: Trivial pericardial effusion is present. Mitral Valve: The mitral valve is normal in structure. There is moderate thickening of the mitral valve leaflet(s). There is moderate calcification of the mitral valve leaflet(s). Moderate mitral annular calcification. Mild mitral valve regurgitation. No  evidence of mitral valve stenosis. Tricuspid Valve: The tricuspid valve is normal in structure. Tricuspid valve regurgitation is mild . No evidence of tricuspid stenosis. Aortic Valve: The aortic valve is normal in structure. There is moderate calcification of the aortic valve. There is moderate thickening of the aortic valve. Aortic valve regurgitation is mild. Mild aortic stenosis is present. Aortic valve mean gradient measures 10.0 mmHg. Pulmonic Valve: The pulmonic valve was normal in structure. Pulmonic valve regurgitation is not visualized. No evidence of pulmonic stenosis. Aorta: The aortic root is normal  in size and structure. Venous: The inferior vena cava is normal in size with greater than 50% respiratory variability, suggesting right atrial pressure of 3 mmHg. IAS/Shunts: No atrial level shunt detected by color flow Doppler.  LEFT VENTRICLE PLAX 2D LVIDd:         5.10 cm      Diastology LVIDs:         3.60 cm      LV e' medial:    3.50 cm/s LV PW:         0.90 cm      LV E/e' medial:  28.1 LV IVS:        0.90 cm      LV e' lateral:   6.00 cm/s LVOT diam:     2.10 cm      LV E/e' lateral: 16.4 LV SV:         69 LV SV Index:   45 LVOT Area:     3.46 cm  LV Volumes (MOD) LV vol d, MOD A2C: 102.0 ml LV vol d, MOD A4C: 87.6 ml LV vol s, MOD A2C: 47.1 ml LV vol s, MOD A4C: 46.4 ml LV SV MOD A2C:     54.9 ml LV SV MOD A4C:     87.6 ml LV SV MOD BP:      51.1 ml RIGHT VENTRICLE RV S prime:     10.70 cm/s TAPSE (M-mode): 1.4 cm LEFT ATRIUM             Index       RIGHT ATRIUM  Index LA diam:        4.50 cm 2.89 cm/m  RA Area:     11.30 cm LA Vol (A2C):   59.4 ml 38.18 ml/m RA Volume:   24.60 ml  15.81 ml/m LA Vol (A4C):   40.9 ml 26.29 ml/m LA Biplane Vol: 49.8 ml 32.01 ml/m  AORTIC VALVE AV Mean Grad: 10.0 mmHg LVOT Vmax:    94.60 cm/s LVOT Vmean:   63.500 cm/s LVOT VTI:     0.200 m  AORTA Ao Root diam: 3.00 cm MITRAL VALVE MV Area (PHT): 3.85 cm    SHUNTS MV Decel Time: 197 msec    Systemic VTI:  0.20 m MV E velocity: 98.50 cm/s  Systemic Diam: 2.10 cm MV A velocity: 71.60 cm/s MV E/A ratio:  1.38 Ena Dawley MD Electronically signed by Ena Dawley MD Signature Date/Time: 11/18/2019/1:52:00 PM    Final    CT ANGIO HEAD CODE STROKE  Result Date: 11/17/2019 CLINICAL DATA:  TIA EXAM: CT ANGIOGRAPHY HEAD AND NECK TECHNIQUE: Multidetector CT imaging of the head and neck was performed using the standard protocol during bolus administration of intravenous contrast. Multiplanar CT image reconstructions and MIPs were obtained to evaluate the vascular anatomy. Carotid stenosis measurements (when  applicable) are obtained utilizing NASCET criteria, using the distal internal carotid diameter as the denominator. CONTRAST:  34mL OMNIPAQUE IOHEXOL 350 MG/ML SOLN COMPARISON:  None. FINDINGS: CTA NECK FINDINGS SKELETON: There is no bony spinal canal stenosis. No lytic or blastic lesion. OTHER NECK: Heterogeneous and enlarged right thyroid lobe measuring 3 1 x 2.5 cm. UPPER CHEST: Biapical emphysema AORTIC ARCH: There is calcific atherosclerosis of the aortic arch. There is no aneurysm, dissection or hemodynamically significant stenosis of the visualized portion of the aorta. Conventional 3 vessel aortic branching pattern. The visualized proximal subclavian arteries are widely patent. RIGHT CAROTID SYSTEM: No dissection, occlusion or aneurysm. There is predominantly calcified atherosclerosis extending into the proximal ICA, resulting in 50% stenosis. LEFT CAROTID SYSTEM: No dissection, occlusion or aneurysm. Mild atherosclerotic calcification at the carotid bifurcation without hemodynamically significant stenosis. VERTEBRAL ARTERIES: Codominant configuration. Both origins are clearly patent. There is no dissection, occlusion or flow-limiting stenosis to the skull base (V1-V3 segments). CTA HEAD FINDINGS POSTERIOR CIRCULATION: --Vertebral arteries: Normal V4 segments. --Inferior cerebellar arteries: Normal. --Basilar artery: Normal. --Superior cerebellar arteries: Normal. --Posterior cerebral arteries (PCA): Normal. ANTERIOR CIRCULATION: --Intracranial internal carotid arteries: Atherosclerotic calcification of the internal carotid arteries at the skull base without hemodynamically significant stenosis. --Anterior cerebral arteries (ACA): Normal. Both A1 segments are present. Patent anterior communicating artery (a-comm). --Middle cerebral arteries (MCA): Normal. VENOUS SINUSES: As permitted by contrast timing, patent. ANATOMIC VARIANTS: None Review of the MIP images confirms the above findings. IMPRESSION: 1. No  intracranial arterial occlusion or high-grade stenosis. 2. Approximately 50% stenosis of the proximal right internal carotid artery secondary to predominantly calcified atherosclerosis. 3. Heterogeneous and enlarged right thyroid lobe measuring 3 1 x 2.5 cm. Recommend thyroid ultrasound (ref: J Am Coll Radiol. 2015 Feb;12(2): 143-50). Aortic Atherosclerosis (ICD10-I70.0) and Emphysema (ICD10-J43.9). Electronically Signed   By: Ulyses Jarred M.D.   On: 11/17/2019 21:51   CT ANGIO NECK CODE STROKE  Result Date: 11/17/2019 CLINICAL DATA:  TIA EXAM: CT ANGIOGRAPHY HEAD AND NECK TECHNIQUE: Multidetector CT imaging of the head and neck was performed using the standard protocol during bolus administration of intravenous contrast. Multiplanar CT image reconstructions and MIPs were obtained to evaluate the vascular anatomy. Carotid stenosis measurements (when applicable) are obtained utilizing  NASCET criteria, using the distal internal carotid diameter as the denominator. CONTRAST:  69mL OMNIPAQUE IOHEXOL 350 MG/ML SOLN COMPARISON:  None. FINDINGS: CTA NECK FINDINGS SKELETON: There is no bony spinal canal stenosis. No lytic or blastic lesion. OTHER NECK: Heterogeneous and enlarged right thyroid lobe measuring 3 1 x 2.5 cm. UPPER CHEST: Biapical emphysema AORTIC ARCH: There is calcific atherosclerosis of the aortic arch. There is no aneurysm, dissection or hemodynamically significant stenosis of the visualized portion of the aorta. Conventional 3 vessel aortic branching pattern. The visualized proximal subclavian arteries are widely patent. RIGHT CAROTID SYSTEM: No dissection, occlusion or aneurysm. There is predominantly calcified atherosclerosis extending into the proximal ICA, resulting in 50% stenosis. LEFT CAROTID SYSTEM: No dissection, occlusion or aneurysm. Mild atherosclerotic calcification at the carotid bifurcation without hemodynamically significant stenosis. VERTEBRAL ARTERIES: Codominant configuration. Both  origins are clearly patent. There is no dissection, occlusion or flow-limiting stenosis to the skull base (V1-V3 segments). CTA HEAD FINDINGS POSTERIOR CIRCULATION: --Vertebral arteries: Normal V4 segments. --Inferior cerebellar arteries: Normal. --Basilar artery: Normal. --Superior cerebellar arteries: Normal. --Posterior cerebral arteries (PCA): Normal. ANTERIOR CIRCULATION: --Intracranial internal carotid arteries: Atherosclerotic calcification of the internal carotid arteries at the skull base without hemodynamically significant stenosis. --Anterior cerebral arteries (ACA): Normal. Both A1 segments are present. Patent anterior communicating artery (a-comm). --Middle cerebral arteries (MCA): Normal. VENOUS SINUSES: As permitted by contrast timing, patent. ANATOMIC VARIANTS: None Review of the MIP images confirms the above findings. IMPRESSION: 1. No intracranial arterial occlusion or high-grade stenosis. 2. Approximately 50% stenosis of the proximal right internal carotid artery secondary to predominantly calcified atherosclerosis. 3. Heterogeneous and enlarged right thyroid lobe measuring 3 1 x 2.5 cm. Recommend thyroid ultrasound (ref: J Am Coll Radiol. 2015 Feb;12(2): 143-50). Aortic Atherosclerosis (ICD10-I70.0) and Emphysema (ICD10-J43.9). Electronically Signed   By: Ulyses Jarred M.D.   On: 11/17/2019 21:51    Labs:  Basic Metabolic Panel: Recent Labs  Lab 11/24/19 0623 11/29/19 0927 11/30/19 1209  NA 138 135 134*  K 3.7 4.2 4.4  CL 107 103 101  CO2 22 24 22   GLUCOSE 93 121* 89  BUN 32* 33* 34*  CREATININE 1.08* 1.42* 1.29*  CALCIUM 9.3 9.9 9.9    CBC: Recent Labs  Lab 11/29/19 0927  WBC 7.9  HGB 11.7*  HCT 36.2  MCV 95.3  PLT 284    CBG: No results for input(s): GLUCAP in the last 168 hours.  Family history positive for hypertension as well as hyperlipidemia.  Denies any diabetes mellitus colon cancer esophageal cancer  Brief HPI:   Cindy Smith is a 86 y.o.  right-handed female with history of hypertension as well as colon cancer with colectomy.  Patient lives alone independent with assistive device.  1 level home.  She does have family checks on her routinely.  Presented 11/17/2019 with blurred vision gait disturbance.  CT/MRI showed acute small vessel infarct in the right corona radiata external capsule.  No associated hemorrhage.  CT angiogram of the head and neck with no intracranial occlusion or high-grade stenosis.  Incidental finding of thyroid nodule.  Echocardiogram with ejection fraction of 65 to 70% no wall motion abnormalities grade 2 diastolic dysfunction.  Admission chemistry sodium 133 glucose 108 creatinine 1.10 urinalysis negative nitrite.  Maintain on aspirin and Plavix for CVA prophylaxis.  Tolerating a regular diet.  Therapy evaluations completed and patient was admitted for a comprehensive rehab program   Hospital Course: Cindy Smith was admitted to rehab 11/19/2019 for inpatient therapies to  consist of PT, ST and OT at least three hours five days a week. Past admission physiatrist, therapy team and rehab RN have worked together to provide customized collaborative inpatient rehab.  Pertaining to patient's right corona radiata infarction remained stable event monitor to be arranged as outpatient.  Aspirin and Plavix x3 weeks and aspirin alone patient would follow neurology services.  Blood pressure monitored on Norvasc as well as Tenormin patient will need follow-up primary MD.  Mild CKD latest creatinine improved 1.29 from 1.42.  Incidental finding of nontoxic multinodular goiter follow-up outpatient ultrasound.  History of colon cancer with colectomy monitoring of bowel program.  Lipitor for hyperlipidemia.   Blood pressures were monitored on TID basis and   Diabetes has been monitored with ac/hs CBG checks and SSI was use prn for tighter BS control.    Rehab course: During patient's stay in rehab weekly team conferences were  held to monitor patient's progress, set goals and discuss barriers to discharge. At admission, patient required moderate assist sit to stand moderate assist stand pivot transfers minimal assist supine to sit.  Minimal assist upper body dressing moderate assist lower body dressing set up for eating and grooming  Physical exam.  Blood pressure 159/53 pulse 65 temperature 98.2 respiration 16 oxygen saturation 97% room air Constitutional.  No acute distress HEENT Head.  Normocephalic and atraumatic Eyes.  Pupils round and reactive to light no discharge.nystagmus Neck.  Supple nontender no JVD without thyromegaly Cardiac regular rate rhythm without extra sounds or murmur heard Abdomen.  Soft nontender positive bowel sounds without rebound Respiratory effort normal no respiratory distress without wheeze Extremity no clubbing cyanosis or edema 2+ pulses Skin.  Clean and intact Neurological.  Alert oriented follows commands fair awareness of deficits.  5/5 strength throughout except 4/5 left lower extremity.  Cindy Smith  has had improvement in activity tolerance, balance, postural control as well as ability to compensate for deficits. Cindy Smith has had improvement in functional use RUE/LUE  and RLE/LLE as well as improvement in awareness.  Contact-guard assist for initial hygiene required total assist to complete by PT.  Ambulating 55-75 feet contact-guard assist.  Patient directed in dynamic standing balance with 1 upper extremity support at rolling walker.  Patient is able to cleanse herself but has some difficulty managing without the use of wet wipes.  Stands at the sink for grooming modified independent upper body bathing modified independent lower body bathing modified independent lower body dressing.  Full family teaching completed plan discharge to home       Disposition: Discharge to home    Diet: Regular  Special Instructions: No driving smoking or alcohol  Medications at discharge 1.  Plavix 75  mg daily x10 days and stop 2.  Tylenol as needed 3.  Norvasc 10 mg p.o. daily 4.  Aspirin 81 mg p.o. daily 5.  Tenormin 25 mg p.o. daily 6.  Lipitor 40 mg p.o. daily 7.  Vitamin D 1000 units p.o. daily 8.  Xalatan 0.05% 1 drop both eyes every evening 9.  Florastor 250 mg p.o. twice daily 10.  Vitamin B12 1000 mcg p.o. daily  30-35 minutes were spent completing discharge summary and discharge planning  Discharge Instructions    Ambulatory referral to Neurology   Complete by: As directed    An appointment is requested in approximately 4 weeks right basal ganglia infarction   Ambulatory referral to Physical Medicine Rehab   Complete by: As directed    Moderate complexity follow-up 1 to 2  weeks right basal ganglia infarction       Follow-up Information    Meredith Staggers, MD Follow up.   Specialty: Physical Medicine and Rehabilitation Why: Office to call for appointment Contact information: [address] [place] [address] [phone]        Tisovec, Fransico Him, MD Follow up.   Specialty: Internal Medicine Contact information: [address] [place] [address] [phone]        [facility] Follow up.   Contact information: [address]     [place] [id number] [phone]              Signed: Cathlyn Parsons 11/30/2019, 5:44 PM

## 2019-11-30 NOTE — Progress Notes (Signed)
Physical Therapy Session Note  Patient Details  Name: Cindy Smith MRN: [id number] Date of Birth: 1923-07-21  Today's Date: 11/30/2019 PT Individual Time: 1400-1515 PT Individual Time Calculation (min): 75 min   Short Term Goals: Week 1:  PT Short Term Goal 1 (Week 1): Pt will transfer to and from [facility] with CGA. PT Short Term Goal 1 - Progress (Week 1): Met PT Short Term Goal 2 (Week 1): Pt will ambulate 42ft with CGA and RW PT Short Term Goal 2 - Progress (Week 1): Met PT Short Term Goal 3 (Week 1): Pt will perform bed mobility with supervision assist PT Short Term Goal 3 - Progress (Week 1): Met Week 2:  PT Short Term Goal 1 (Week 2): STGs = LTGs due to ELOS  Skilled Therapeutic Interventions/Progress Updates:    pt received at toilet with daughter present and NCT and agreeable to therapy; handed off to PT. Pt denied pain at start and end of session. Pt able to void BM, CGA for initial hygiene and required total A to complete by PT. Pt then required max A for pulling pants over hips in standing with Rolling walker for stability. Pt directed in [place] mobility at min A to sink and teeth brushing at setup A. Pt taken to gym in [facility], dependent for time management and energy conservation. Pt directed in dynamic standing balance with one UE support at Rolling walker with 2x20 forward and cross body reaching inside and outside [place] of support and limits of stability. Pt required rest break in sitting post each set 2/2 fatigue, min A -CGA overall for standing balance with VC for trunk extension, midline stance and increased Base of support. Pt directed in gait training with Rolling walker for 75' and prolonged rest break at this distance 2/2 fatigue, CGA and then additional 63' CGA. Pt continues to have severe genuvalgus on L knee and limits stability and decreased distance of ambulation. Though pt is very motivated. Pt returned to room in [facility], directed in stand pivot transfer Hand held assist to  recliner and left in recliner, All needs in reach and in good condition. Call light in hand.  Daughter present.   Therapy Documentation Precautions:  Precautions Precautions: Fall Precaution Comments: chronic Lt knee injury, knee brace when OOB Restrictions Weight Bearing Restrictions: No    Therapy/Group: Individual Therapy  Junie Panning 11/30/2019, 3:19 PM

## 2019-11-30 NOTE — Patient Care Conference (Signed)
Inpatient RehabilitationTeam Conference and Plan of Care Update Date: 11/30/2019   Time: 10:38 AM    Patient Name: Cindy Smith      Medical Record Number: [id number]  Date of Birth: October 10, 1923 Sex: Female         Room/Bed: 4M11C/4M11C-01 Payor Info: Payor: MEDICARE / Plan: MEDICARE PART A AND B / Product Type: *No Product type* /    Admit Date/Time:  11/19/2019  1:41 PM  Primary Diagnosis:  Infarction of right basal ganglia [facility])  Hospital Problems: Principal Problem:   Infarction of right basal ganglia (HCC) Active Problems:   Pressure injury of skin   Anemia of chronic disease   History of colon cancer   Essential hypertension   Muscle spasm   Stage 3a chronic kidney disease [facility])    Expected Discharge Date: Expected Discharge Date: 12/01/19  Team Members Present: Physician leading conference: Dr. Alger Simons Care Coodinator Present: Loralee Pacas, LCSWA;Lequita Meadowcroft Creig Hines, RN, BSN, [place] Nurse Present: Suella Grove, RN PT Present: Tereasa Coop, PT OT Present: Meriel Pica, OT PPS Coordinator present : Ileana Ladd, PT     Current Status/Progress Goal Weekly Team Focus  Bowel/Bladder   pt incontinent of b and b, using briefs and q 2 toiletting  decrease incontinent episodes and increase control of bladder and bowel  Assess q shift ad prn    Swallow/Nutrition/ Hydration             ADL's   S with most self care tasks and transfers - working towards mod I level  Mod I (lives alone)  ADL training, functional mobility, pt education   Mobility   supervision to mod(I) for all mobility  mod(I), gait household distances with LRAD  DC prep   Communication             Safety/Cognition/ Behavioral Observations            Pain   pt denies pain at this time   mainstain pain free  Assess q shift and prn    Skin   stage 1 to coccyx, MASD to bottom  prevent furthur breakdown and skin infection  assess q shift and prn, vhange dressings according to orders      Discharge Planning:  D/c to home with dtr who will initially be in the home to provide assistance. Pt will have support from family that lives locally that will check in on patient intermittently.   Team Discussion: Continent with incontinent episodes, foam dressing to buttocks. OT reports patient has met goals except for toileting which are at a supervision level. PT reports patient has met goals and is ready for discharge. MD reports patient is medically ready for discharge Patient on target to meet rehab goals: yes  *See Care Plan and progress notes for long and short-term goals.   Revisions to Treatment Plan:  SW adding nursing to come to home for treatment of bottom.  Teaching Needs: Family education complete.  Current Barriers to Discharge: Medical stability  Possible Resolutions to Barriers: Finalizing discharge plan.     Medical Summary Current Status: improved bowel/bladder continence, toileting schedule. BP better controlled. monitoring renal fxn  Barriers to Discharge: Medical stability   Possible Resolutions to Barriers/Weekly Focus: finalize dc planning, f/u renal fxn today   Continued Need for Acute Rehabilitation Level of Care: The patient requires daily medical management by a physician with specialized training in physical medicine and rehabilitation for the following reasons: Direction of a multidisciplinary physical rehabilitation program to  maximize functional independence : Yes Medical management of patient stability for increased activity during participation in an intensive rehabilitation regime.: Yes   I attest that I was present, lead the team conference, and concur with the assessment and plan of the team.   Cristi Loron 11/30/2019, 3:12 PM

## 2019-11-30 NOTE — Progress Notes (Signed)
Occupational Therapy Discharge Summary  Patient Details  Name: Cindy Smith MRN: [id number] Date of Birth: 1923/09/22  Patient has met 7 of 8 long term goals due to improved activity tolerance, improved balance, postural control, ability to compensate for deficits, functional use of  LEFT lower extremity and improved coordination.  Patient to discharge at overall Modified Independent level.  Patient's care partner is independent to provide the necessary physical assistance at discharge.    Reasons goals not met: She is able to cleanse herself but has difficulty managing without the use of wet wipes and often gets the toilet seat dirty. Had a discussion with the pt and her daughter on strategies to make this easier at home. For now, pt should have S with this task to ensure good hygiene. Her daughter will be staying with her for at least a week and a half, so they will work on that together.    For getting in and out of the shower, pt will always have S as she had that before. For bathing, dressing, donning Knee brace, standing at sink for oral care, pt is mod I.   Recommendation:  Patient will benefit from ongoing skilled OT services in home health setting to continue to advance functional skills in the area of BADL and iADL.  Equipment: No equipment provided  Reasons for discharge: treatment goals met  Patient/family agrees with progress made and goals achieved: Yes  OT Discharge Precautions/Restrictions  Precautions Precautions: Fall Precaution Comments: chronic Lt knee injury, knee brace when OOB Restrictions Weight Bearing Restrictions: No   ADL ADL Eating: Independent Where Assessed-Eating: Chair Grooming: Modified independent Where Assessed-Grooming: Standing at sink Upper Body Bathing: Modified independent Where Assessed-Upper Body Bathing: Sitting at sink Lower Body Bathing: Modified independent Where Assessed-Lower Body Bathing: Sitting at sink Upper Body  Dressing: Independent Where Assessed-Upper Body Dressing: Chair Lower Body Dressing: Modified independent Where Assessed-Lower Body Dressing: Chair Toileting: Supervision/safety Where Assessed-Toileting: Glass blower/designer: Diplomatic Services operational officer Method: Counselling psychologist: Energy manager: Close supervison Clinical cytogeneticist Method: Engineer, production: Radio broadcast assistant, Grab bars Vision Baseline Vision/History: Wears glasses Vision Assessment?: No apparent visual deficits Perception  Perception: Within Functional Limits Praxis Praxis: Intact Cognition Overall Cognitive Status: Within Functional Limits for tasks assessed Arousal/Alertness: Awake/alert Orientation Level: Oriented X4 Safety/Judgment: Appears intact Sensation Sensation Light Touch: Appears Intact Hot/Cold: Appears Intact Proprioception: Appears Intact Stereognosis: Appears Intact Additional Comments: able to detect all stimuli, reports baseline neuropathy Coordination Gross Motor Movements are Fluid and Coordinated: Yes Fine Motor Movements are Fluid and Coordinated: Yes Motor  Motor Motor: Within Functional Limits Mobility  Bed Mobility Bed Mobility: Rolling Right;Rolling Left;Sit to Supine;Supine to Sit Rolling Right: Independent Rolling Left: Independent Supine to Sit: Independent Sit to Supine: Independent Transfers Sit to Stand: Independent with assistive device Stand to Sit: Independent with assistive device  Trunk/Postural Assessment  Cervical Assessment Cervical Assessment: Exceptions to [facility] (forward head) Thoracic Assessment Thoracic Assessment: Exceptions to [facility] (rounded shoulders) Lumbar Assessment Lumbar Assessment: Exceptions to [facility] (posterior pelvic tilt) Postural Control Postural Control: Within Functional Limits  Balance Balance Balance Assessed: Yes Dynamic Sitting Balance Dynamic Sitting - Level of Assistance:  7: Independent Static Standing Balance Static Standing - Balance Support: Bilateral upper extremity supported Static Standing - Level of Assistance: 6: Modified independent (Device/Increase time) Dynamic Standing Balance Dynamic Standing - Balance Support: Bilateral upper extremity supported Dynamic Standing - Level of Assistance: 6: Modified independent (Device/Increase time) Extremity/Trunk Assessment RUE Assessment  RUE Assessment: Within Functional Limits General Strength Comments: 4-/5 grossly LUE Assessment LUE Assessment: Within Functional Limits General Strength Comments: 4-/5 grossly   Cindy Smith 11/30/2019, 1:00 PM

## 2019-11-30 NOTE — Progress Notes (Signed)
[facility] PHYSICAL MEDICINE & REHABILITATION PROGRESS NOTE   Subjective/Complaints: Up with NT in bathroom. No major issues overnight. No incontinence and bowels seem much  More formed.  ROS: Patient denies fever, rash, sore throat, blurred vision, nausea, vomiting, diarrhea, cough, shortness of breath or chest pain, joint or back pain, headache, or mood change.    Objective:   No results found. Recent Labs    11/29/19 0927  WBC 7.9  HGB 11.7*  HCT 36.2  PLT 284   Recent Labs    11/29/19 0927  NA 135  K 4.2  CL 103  CO2 24  GLUCOSE 121*  BUN 33*  CREATININE 1.42*  CALCIUM 9.9    Intake/Output Summary (Last 24 hours) at 11/30/2019 2094 Last data filed at 11/30/2019 0700 Gross per 24 hour  Intake 500 ml  Output --  Net 500 ml     Pressure Injury 11/19/19 Coccyx Bilateral Stage 1 -  Intact skin with non-blanchable redness of a localized area usually over a bony prominence. Non blanchable,red flaky skin, bilaterally (Active)  11/19/19 1456  Location: Coccyx  Location Orientation: Bilateral  Staging: Stage 1 -  Intact skin with non-blanchable redness of a localized area usually over a bony prominence.  Wound Description (Comments): Non blanchable,red flaky skin, bilaterally  Present on Admission: Yes    Physical Exam: Vital Signs Blood pressure (!) 144/47, pulse (!) 49, temperature 98.2 F (36.8 C), resp. rate 15, height 5' (1.524 m), weight 58.9 kg, SpO2 98 %.  Constitutional: No distress . Vital signs reviewed. HEENT: EOMI, oral membranes moist Neck: supple Cardiovascular: RRR without murmur. No JVD    Respiratory/Chest: CTA Bilaterally without wheezes or rales. Normal effort    GI/Abdomen: BS +, non-tender, non-distended Ext: no clubbing, cyanosis, or edema Psych: pleasant and cooperative Skin: sacrum sl red only Musc: No edema in extremities.  No tenderness in extremities. Neuro: Alert, fair insight. Mild STM deficits Motor: RUE/RLE: 5/5 proximal  distally in LUE/LLE: 4 to 4+/5 proximal to distally. Sensory norma.   Assessment/Plan: 1. Functional deficits secondary to Right corona radiata infarct which require 3+ hours per day of interdisciplinary therapy in a comprehensive inpatient rehab setting.  Physiatrist is providing close team supervision and 24 hour management of active medical problems listed below.  Physiatrist and rehab team continue to assess barriers to discharge/monitor patient progress toward functional and medical goals  Care Tool:  Bathing    Body parts bathed by patient: Right arm, Left arm, Chest, Abdomen, Front perineal area, Face, Left lower leg, Right lower leg, Left upper leg, Right upper leg   Body parts bathed by helper: Buttocks     Bathing assist Assist Level: Contact Guard/Touching assist     Upper Body Dressing/Undressing Upper body dressing   What is the patient wearing?: Pull over shirt    Upper body assist Assist Level: Supervision/Verbal cueing    Lower Body Dressing/Undressing Lower body dressing      What is the patient wearing?: Incontinence brief, Pants     Lower body assist Assist for lower body dressing: Maximal Assistance - Patient 25 - 49%     Toileting Toileting    Toileting assist Assist for toileting: Supervision/Verbal cueing     Transfers Chair/bed transfer  Transfers assist     Chair/bed transfer assist level: Supervision/Verbal cueing     Locomotion Ambulation   Ambulation assist      Assist level: Supervision/Verbal cueing Assistive device: Rollator Max distance: 50'   Walk 10  feet activity   Assist     Assist level: Supervision/Verbal cueing Assistive device: Rollator   Walk 50 feet activity   Assist Walk 50 feet with 2 turns activity did not occur: Safety/medical concerns  Assist level: Supervision/Verbal cueing Assistive device: Rollator    Walk 150 feet activity   Assist Walk 150 feet activity did not occur:  Safety/medical concerns         Walk 10 feet on uneven surface  activity   Assist Walk 10 feet on uneven surfaces activity did not occur: Safety/medical concerns         Wheelchair     Assist Will patient use wheelchair at discharge?: Yes (Per PT long term goals ) Type of Wheelchair: Manual    Wheelchair assist level: Minimal Assistance - Patient > 75% Max wheelchair distance: 150    Wheelchair 50 feet with 2 turns activity    Assist        Assist Level: Minimal Assistance - Patient > 75%   Wheelchair 150 feet activity     Assist      Assist Level: Minimal Assistance - Patient > 75%   Blood pressure (!) 144/47, pulse (!) 49, temperature 98.2 F (36.8 C), resp. rate 15, height 5' (1.524 m), weight 58.9 kg, SpO2 98 %.  Medical Problem List and Plan: 1.  Blurred vision with gait disturbance secondary to right Corona radiata infarction.  Event monitor to be arranged for outpatient  Continue CIR--finalize dc planning, team conf today  ELOS 10/27  Continue with left knee brace for genu valgus--seems to be helping. Left knee is as much a problem as anything right now 2.  Antithrombotics: -DVT/anticoagulation:  SCD             -antiplatelet therapy: Aspirin 81 mg daily and Plavix 75 mg daily x3 weeks then aspirin alone 3. Pain Management: Tylenol as needed. continue kpad for upper back muscle spasms.   Controlled on 10/26 4. Mood: Provide emotional support             -antipsychotic agents: N/A 5. Neuropsych: This patient is capable of making decisions on her own behalf. 6. Skin/Wound Care: encourage appropriate PO  7. Fluids/Electrolytes/Nutrition: encourage better PO  Encourage p.o.  Labs are ordered for Monday 8.  Hypertension.  Norvasc 10 mg daily, Tenormin 25 mg daily.    - po Hydralazine 25mg  q8H prn for SBP>180.   Lisinopril 2.5 started on 10/23, monitor kidney function  10/25 IMPROVED control  -recheck Cr today since going home tomorrow 9.   Hyperlipidemia.  Lipitor 10.  History of colon cancer with colectomy.     -10/25 fiber/probiotic  -appreciate RD input.   - q2 hour toileting schedule    -stool more formed and overall much improved 11.  Anemia of chronic disease.    Hemoglobin 11.0 on 11/18 12.  Incidental finding of nontoxic multinodular goiter.  Follow-up outpatient ultrasound  13.?  CKD  Creatinine sl elevated today again 1.42---recheck today    LOS: 11 days A FACE TO FACE EVALUATION WAS PERFORMED  Meredith Staggers 11/30/2019, 9:21 AM

## 2019-11-30 NOTE — Progress Notes (Addendum)
Patient ID: Cindy Smith, female   DOB: [birth year], 86 y.o.   MRN: [id number]  SW met with pt and pt dtr Lattie Haw in room to provide updates from team conference on gains made, and d/c remains tomorrow. SW confirms RW delivered to room. Pt dtr reports she cancelled her trip for this weekend, and will be with patient at time of discharge.   *Dtr would like for pt to have booster vaccine. Pt have J&J COVID vaccine in March 2021. Amenable to [employer] or [employer] booster. SW updated medical team.  Update: booster not offered at hospital. SW updated pt dtr.   Loralee Pacas, MSW, [place] Office: [phone] Cell: [phone] Fax: [phone]

## 2019-12-01 LAB — BASIC METABOLIC PANEL
Anion gap: 11 (ref 5–15)
BUN: 36 mg/dL — ABNORMAL HIGH (ref 8–23)
CO2: 21 mmol/L — ABNORMAL LOW (ref 22–32)
Calcium: 9.3 mg/dL (ref 8.9–10.3)
Chloride: 104 mmol/L (ref 98–111)
Creatinine, Ser: 1.32 mg/dL — ABNORMAL HIGH (ref 0.44–1.00)
GFR, Estimated: 38 mL/min — ABNORMAL LOW (ref >60)
Glucose, Bld: 93 mg/dL (ref 70–99)
Potassium: 4.3 mmol/L (ref 3.5–5.1)
Sodium: 136 mmol/L (ref 135–145)

## 2019-12-01 MED ORDER — CYANOCOBALAMIN 1000 MCG PO TABS: 1000.0000 ug | ORAL_TABLET | Freq: Every day | ORAL | 0 refills | Status: DC

## 2019-12-01 MED ORDER — AMLODIPINE BESYLATE 10 MG PO TABS: 10.0000 mg | ORAL_TABLET | Freq: Every day | ORAL | 0 refills | Status: AC

## 2019-12-01 MED ORDER — CLOPIDOGREL BISULFATE 75 MG PO TABS: 75.0000 mg | ORAL_TABLET | Freq: Every day | ORAL | 0 refills | Status: AC

## 2019-12-01 MED ORDER — SACCHAROMYCES BOULARDII 250 MG PO CAPS: 250.0000 mg | ORAL_CAPSULE | Freq: Two times a day (BID) | ORAL | 0 refills | Status: AC

## 2019-12-01 MED ORDER — ATENOLOL 25 MG PO TABS: 25.0000 mg | ORAL_TABLET | Freq: Every day | ORAL | 0 refills | Status: AC

## 2019-12-01 MED ORDER — ATORVASTATIN CALCIUM 40 MG PO TABS
40.0000 mg | ORAL_TABLET | Freq: Every day | ORAL | 0 refills | Status: DC
Start: 2019-12-01 — End: 2020-08-01

## 2019-12-01 NOTE — Plan of Care (Signed)
  Problem: Consults Goal: RH STROKE PATIENT EDUCATION Description: See Patient Education module for education specifics  Outcome: Completed/Met   Problem: RH BOWEL ELIMINATION Goal: RH STG MANAGE BOWEL WITH ASSISTANCE Description: STG Manage Bowel with mod I Assistance. Outcome: Completed/Met   Problem: RH BLADDER ELIMINATION Goal: RH STG MANAGE BLADDER WITH ASSISTANCE Description: STG Manage Bladder With Mod I Assistance Outcome: Completed/Met   Problem: RH SKIN INTEGRITY Goal: RH STG SKIN FREE OF INFECTION/BREAKDOWN Description: Skin to remain free from breakdown and infection while on rehab with mod I assistance. Outcome: Completed/Met Goal: RH STG MAINTAIN SKIN INTEGRITY WITH ASSISTANCE Description: STG Maintain Skin Integrity With mod I Assistance. Outcome: Completed/Met   Problem: RH PAIN MANAGEMENT Goal: RH STG PAIN MANAGED AT OR BELOW PT'S PAIN GOAL Description: <3 on a 0-10 pain scale. Outcome: Completed/Met   Problem: RH KNOWLEDGE DEFICIT Goal: RH STG INCREASE KNOWLEDGE OF HYPERTENSION Description: Patient and caregiver will be able to demonstrate knowledge of HTN medications, medication management, dietary control, BP management, and follow up care with the MD with cues and reminders from rehab staff. Outcome: Completed/Met Goal: RH STG INCREASE KNOWLEGDE OF HYPERLIPIDEMIA Description: Patient and caregiver will be able to demonstrate knowledge of HLD medications, medication management, dietary control, and follow up care with the MD with cues and reminders from rehab staff.  Outcome: Completed/Met Goal: RH STG INCREASE KNOWLEDGE OF STROKE PROPHYLAXIS Description: Patient and caregiver will be able to demonstrate knowledge of medications used to treat and prevent future strokes, medication management, dietary control, BP management, cholesterol management, and follow up care with the MD with cues and reminders from rehab staff.  Outcome: Completed/Met

## 2019-12-01 NOTE — Progress Notes (Signed)
Patient discharged off of unit with all belongings. Discharge papers/instructions explained by physician assistant to family. Patient and family have no further questions at time of discharge. No complications noted at this time.  Cindy Smith L [name]  

## 2019-12-01 NOTE — Discharge Instructions (Signed)
Inpatient Rehab Discharge Instructions  Cindy Smith Discharge date and time: 11/30/19   Activities/Precautions/ Functional Status: Activity: activity as tolerated Diet: regular diet Need to increase fluid intake (getting dehydrated) Wound Care: Routine skin checks   Functional status:  ___ No restrictions     ___ Walk up steps independently _X__ 24/7 supervision/assistance   ___ Walk up steps with assistance ___ Intermittent supervision/assistance  ___ Bathe/dress independently ___ Walk with walker     _X__ Bathe/dress with assistance ___ Walk Independently    ___ Shower independently ___ Walk with assistance    ___ Shower with assistance _X__ No alcohol     ___ Return to work/school ________  COMMUNITY REFERRALS UPON DISCHARGE:    Home Health:   PT     OT     SNA                  Agency: [facility]  Phone: [phone] *Please expect follow-up within 2-3 days of discharge to schedule your home visit. If you have not received follow-up, be sure to contact the branch directly.*   Medical Equipment/Items Ordered: junior rolling walker                                                 Agency/Supplier: [facility] [phone]    Special Instructions: 1. No driving smoking or alcohol 2. Continue Plavix for 10 more days then stop. Continue to take aspirin for stroke prevention.     STROKE/TIA DISCHARGE INSTRUCTIONS SMOKING Cigarette smoking nearly doubles your risk of having a stroke & is the single most alterable risk factor  If you smoke or have smoked in the last 12 months, you are advised to quit smoking for your health.  Most of the excess cardiovascular risk related to smoking disappears within a year of stopping.  Ask you doctor about anti-smoking medications  [place] Quit Line: 1-800-QUIT NOW  Free Smoking Cessation Classes [phone]  CHOLESTEROL Know your levels; limit fat & cholesterol in your diet  Lipid Panel     Component Value  Date/Time   CHOL 212 (H) 11/18/2019 0440   TRIG 90 11/18/2019 0440   HDL 62 11/18/2019 0440   CHOLHDL 3.4 11/18/2019 0440   VLDL 18 11/18/2019 0440   LDLCALC 132 (H) 11/18/2019 0440      Many patients benefit from treatment even if their cholesterol is at goal.  Goal: Total Cholesterol (CHOL) less than 160  Goal:  Triglycerides (TRIG) less than 150  Goal:  HDL greater than 40  Goal:  LDL (LDLCALC) less than 100   BLOOD PRESSURE American Stroke Association blood pressure target is less that 120/80 mm/Hg  Your discharge blood pressure is:  BP: (!) 187/60  Monitor your blood pressure  Limit your salt and alcohol intake  Many individuals will require more than one medication for high blood pressure  DIABETES (A1c is a blood sugar average for last 3 months) Goal HGBA1c is under 7% (HBGA1c is blood sugar average for last 3 months)  Diabetes: No known diagnosis of diabetes    Lab Results  Component Value Date   HGBA1C 5.4 11/18/2019     Your HGBA1c can be lowered with medications, healthy diet, and exercise.  Check your blood sugar as directed by your physician  Call your physician if you experience unexplained or low  blood sugars.  PHYSICAL ACTIVITY/REHABILITATION Goal is 30 minutes at least 4 days per week  Activity: Increase activity slowly, Therapies: Physical Therapy: Home Health Return to work:   Activity decreases your risk of heart attack and stroke and makes your heart stronger.  It helps control your weight and blood pressure; helps you relax and can improve your mood.  Participate in a regular exercise program.  Talk with your doctor about the best form of exercise for you (dancing, walking, swimming, cycling).  DIET/WEIGHT Goal is to maintain a healthy weight  Your discharge diet is:  Diet Order            Diet Heart Room service appropriate? Yes; Fluid consistency: Thin  Diet effective now                 liquids Your height is:  Height: 5' (152.4  cm) Your current weight is: Weight: 58.9 kg Your Body Mass Index (BMI) is:  BMI (Calculated): 25.36  Following the type of diet specifically designed for you will help prevent another stroke.  Your goal weight range is:    Your goal Body Mass Index (BMI) is 19-24.  Healthy food habits can help reduce 3 risk factors for stroke:  High cholesterol, hypertension, and excess weight.  RESOURCES Stroke/Support Group:  Call [phone]   STROKE EDUCATION PROVIDED/REVIEWED AND GIVEN TO PATIENT Stroke warning signs and symptoms How to activate emergency medical system (call 911). Medications prescribed at discharge. Need for follow-up after discharge. Personal risk factors for stroke. Pneumonia vaccine given:  Flu vaccine given:  My questions have been answered, the writing is legible, and I understand these instructions.  I will adhere to these goals & educational materials that have been provided to me after my discharge from the hospital.     My questions have been answered and I understand these instructions. I will adhere to these goals and the provided educational materials after my discharge from the hospital.  Patient/Caregiver Signature _______________________________ Date __________  Clinician Signature _______________________________________ Date __________  Please bring this form and your medication list with you to all your follow-up doctor's appointments.

## 2019-12-01 NOTE — Progress Notes (Signed)
Inpatient Rehabilitation Care Coordinator  Discharge Note  The overall goal for the admission was met for:   Discharge location: Yes. D/c to home with support from her dtr, and various family members.   Length of Stay: Yes. 11 days.   Discharge activity level: Yes  Home/community participation: Yes. Min A to [facility].  Services provided included: MD, RD, PT, OT, RN, CM, TR, Pharmacy, Neuropsych and SW  Financial Services: Medicare and Private Insurance: [place]  Follow-up services arranged: Home Health: [facility] for HHPT/OT/SN/aide and DME: [facility] for junior RW  Comments (or additional information): contact pt dtr Lattie Haw [phone]  Patient/Family verbalized understanding of follow-up arrangements: Yes  Individual responsible for coordination of the follow-up plan: Pt to have assistance with coordinating care needs.   Confirmed correct DME delivered: Rana Snare 12/01/2019    Loralee Pacas, MSW, [place] Office: [phone] Cell: [phone] Fax: [phone]

## 2019-12-03 ENCOUNTER — Telehealth: Payer: Self-pay | Admitting: *Deleted

## 2019-12-03 ENCOUNTER — Other Ambulatory Visit: Payer: Self-pay | Admitting: *Deleted

## 2019-12-03 DIAGNOSIS — E785 Hyperlipidemia, unspecified: Secondary | ICD-10-CM | POA: Diagnosis not present

## 2019-12-03 DIAGNOSIS — E042 Nontoxic multinodular goiter: Secondary | ICD-10-CM | POA: Diagnosis not present

## 2019-12-03 DIAGNOSIS — H547 Unspecified visual loss: Secondary | ICD-10-CM | POA: Diagnosis not present

## 2019-12-03 DIAGNOSIS — D631 Anemia in chronic kidney disease: Secondary | ICD-10-CM | POA: Diagnosis not present

## 2019-12-03 DIAGNOSIS — M47812 Spondylosis without myelopathy or radiculopathy, cervical region: Secondary | ICD-10-CM | POA: Diagnosis not present

## 2019-12-03 DIAGNOSIS — I69398 Other sequelae of cerebral infarction: Secondary | ICD-10-CM | POA: Diagnosis not present

## 2019-12-03 DIAGNOSIS — I1310 Hypertensive heart and chronic kidney disease without heart failure, with stage 1 through stage 4 chronic kidney disease, or unspecified chronic kidney disease: Secondary | ICD-10-CM | POA: Diagnosis not present

## 2019-12-03 DIAGNOSIS — M1711 Unilateral primary osteoarthritis, right knee: Secondary | ICD-10-CM | POA: Diagnosis not present

## 2019-12-03 DIAGNOSIS — R2681 Unsteadiness on feet: Secondary | ICD-10-CM | POA: Diagnosis not present

## 2019-12-03 DIAGNOSIS — I083 Combined rheumatic disorders of mitral, aortic and tricuspid valves: Secondary | ICD-10-CM | POA: Diagnosis not present

## 2019-12-03 DIAGNOSIS — I69312 Visuospatial deficit and spatial neglect following cerebral infarction: Secondary | ICD-10-CM | POA: Diagnosis not present

## 2019-12-03 DIAGNOSIS — N1831 Chronic kidney disease, stage 3a: Secondary | ICD-10-CM | POA: Diagnosis not present

## 2019-12-03 DIAGNOSIS — Z7982 Long term (current) use of aspirin: Secondary | ICD-10-CM | POA: Diagnosis not present

## 2019-12-03 DIAGNOSIS — Z9181 History of falling: Secondary | ICD-10-CM | POA: Diagnosis not present

## 2019-12-03 DIAGNOSIS — Z85038 Personal history of other malignant neoplasm of large intestine: Secondary | ICD-10-CM | POA: Diagnosis not present

## 2019-12-03 DIAGNOSIS — I639 Cerebral infarction, unspecified: Secondary | ICD-10-CM | POA: Diagnosis not present

## 2019-12-03 NOTE — Telephone Encounter (Signed)
Transitional Care call completed    1. Are you/is patient experiencing any problems since coming home? Tired all the time.  Sleeping at night is good one night and then bad the next.  Trying to get back into a normal rhythm Are there any questions regarding any aspect of care? No  2. Are there any questions regarding medications administration/dosing? Probiotic that is prescribed is expensive. Can alternatives be used that are cheaper.  Are meds being taken as prescribed? Yes Patient should review meds with caller to confirm   3. Have there been any falls? No  4. Has Home Health been to the house and/or have they contacted you? If not, have you tried to contact them? Can we help you contact them?   5. Are bowels and bladder emptying properly? Patient is already on a bowel program. Are there any unexpected incontinence issues?  NoIf applicable, is patient following bowel/bladder programs?   6. Any fevers, problems with breathing, unexpected pain?  No  7. Are there any skin problems or new areas of breakdown? No  8. Has the patient/family member arranged specialty MD follow up (ie cardiology/neurology/renal/surgical/etc)? Yes Can we help arrange? No, daughter is very efficient  9. Does the patient need any other services or support that we can help arrange? Orthopedic referral   10. Are caregivers following through as expected in assisting the patient? Yes, Aide requested to assist  11. Has the patient quit smoking, drinking alcohol, or using drugs as recommended? Patient does not do these things according to daughter

## 2019-12-05 DIAGNOSIS — I69398 Other sequelae of cerebral infarction: Secondary | ICD-10-CM | POA: Diagnosis not present

## 2019-12-05 DIAGNOSIS — N1831 Chronic kidney disease, stage 3a: Secondary | ICD-10-CM | POA: Diagnosis not present

## 2019-12-05 DIAGNOSIS — I69312 Visuospatial deficit and spatial neglect following cerebral infarction: Secondary | ICD-10-CM | POA: Diagnosis not present

## 2019-12-05 DIAGNOSIS — I1310 Hypertensive heart and chronic kidney disease without heart failure, with stage 1 through stage 4 chronic kidney disease, or unspecified chronic kidney disease: Secondary | ICD-10-CM | POA: Diagnosis not present

## 2019-12-05 DIAGNOSIS — R2681 Unsteadiness on feet: Secondary | ICD-10-CM | POA: Diagnosis not present

## 2019-12-05 DIAGNOSIS — D631 Anemia in chronic kidney disease: Secondary | ICD-10-CM | POA: Diagnosis not present

## 2019-12-06 DIAGNOSIS — R2681 Unsteadiness on feet: Secondary | ICD-10-CM | POA: Diagnosis not present

## 2019-12-06 DIAGNOSIS — I69398 Other sequelae of cerebral infarction: Secondary | ICD-10-CM | POA: Diagnosis not present

## 2019-12-06 DIAGNOSIS — I69312 Visuospatial deficit and spatial neglect following cerebral infarction: Secondary | ICD-10-CM | POA: Diagnosis not present

## 2019-12-06 DIAGNOSIS — N1831 Chronic kidney disease, stage 3a: Secondary | ICD-10-CM | POA: Diagnosis not present

## 2019-12-06 DIAGNOSIS — I1310 Hypertensive heart and chronic kidney disease without heart failure, with stage 1 through stage 4 chronic kidney disease, or unspecified chronic kidney disease: Secondary | ICD-10-CM | POA: Diagnosis not present

## 2019-12-06 DIAGNOSIS — D631 Anemia in chronic kidney disease: Secondary | ICD-10-CM | POA: Diagnosis not present

## 2019-12-07 DIAGNOSIS — I10 Essential (primary) hypertension: Secondary | ICD-10-CM | POA: Diagnosis not present

## 2019-12-07 DIAGNOSIS — Z9181 History of falling: Secondary | ICD-10-CM | POA: Diagnosis not present

## 2019-12-07 DIAGNOSIS — I6381 Other cerebral infarction due to occlusion or stenosis of small artery: Secondary | ICD-10-CM | POA: Diagnosis not present

## 2019-12-07 DIAGNOSIS — L8991 Pressure ulcer of unspecified site, stage 1: Secondary | ICD-10-CM | POA: Diagnosis not present

## 2019-12-07 DIAGNOSIS — G629 Polyneuropathy, unspecified: Secondary | ICD-10-CM | POA: Diagnosis not present

## 2019-12-07 DIAGNOSIS — N1832 Chronic kidney disease, stage 3b: Secondary | ICD-10-CM | POA: Diagnosis not present

## 2019-12-07 DIAGNOSIS — R2689 Other abnormalities of gait and mobility: Secondary | ICD-10-CM | POA: Diagnosis not present

## 2019-12-07 DIAGNOSIS — D638 Anemia in other chronic diseases classified elsewhere: Secondary | ICD-10-CM | POA: Diagnosis not present

## 2019-12-07 DIAGNOSIS — E7800 Pure hypercholesterolemia, unspecified: Secondary | ICD-10-CM | POA: Diagnosis not present

## 2019-12-07 DIAGNOSIS — E042 Nontoxic multinodular goiter: Secondary | ICD-10-CM | POA: Diagnosis not present

## 2019-12-08 ENCOUNTER — Telehealth: Payer: Self-pay | Admitting: *Deleted

## 2019-12-08 ENCOUNTER — Other Ambulatory Visit: Payer: Self-pay

## 2019-12-08 ENCOUNTER — Encounter: Payer: Self-pay | Admitting: Physical Medicine & Rehabilitation

## 2019-12-08 ENCOUNTER — Encounter: Payer: Medicare Other | Attending: Physical Medicine & Rehabilitation | Admitting: Physical Medicine & Rehabilitation

## 2019-12-08 VITALS — BP 154/58 | HR 61 | Temp 98.7°F | Ht 60.0 in | Wt 133.5 lb

## 2019-12-08 DIAGNOSIS — N1831 Chronic kidney disease, stage 3a: Secondary | ICD-10-CM | POA: Diagnosis not present

## 2019-12-08 DIAGNOSIS — I639 Cerebral infarction, unspecified: Secondary | ICD-10-CM | POA: Diagnosis not present

## 2019-12-08 DIAGNOSIS — R2681 Unsteadiness on feet: Secondary | ICD-10-CM | POA: Diagnosis not present

## 2019-12-08 DIAGNOSIS — I69312 Visuospatial deficit and spatial neglect following cerebral infarction: Secondary | ICD-10-CM | POA: Diagnosis not present

## 2019-12-08 DIAGNOSIS — I1310 Hypertensive heart and chronic kidney disease without heart failure, with stage 1 through stage 4 chronic kidney disease, or unspecified chronic kidney disease: Secondary | ICD-10-CM | POA: Diagnosis not present

## 2019-12-08 DIAGNOSIS — M1712 Unilateral primary osteoarthritis, left knee: Secondary | ICD-10-CM | POA: Insufficient documentation

## 2019-12-08 DIAGNOSIS — I69398 Other sequelae of cerebral infarction: Secondary | ICD-10-CM | POA: Diagnosis not present

## 2019-12-08 DIAGNOSIS — D631 Anemia in chronic kidney disease: Secondary | ICD-10-CM | POA: Diagnosis not present

## 2019-12-08 NOTE — Patient Instructions (Signed)
PLEASE FEEL FREE TO CALL OUR OFFICE WITH ANY PROBLEMS OR QUESTIONS ([phone])      

## 2019-12-08 NOTE — Progress Notes (Signed)
Subjective:    Patient ID: Cindy Smith, female    DOB: [birth year], 86 y.o.   MRN: [id number]  HPI   Cindy Smith is here for a transitional care visit and follow-up of her right basal ganglia infarct with associated left hemiparesis and left hemiproprioceptive deficits.  She has been home with home health PT and OT working with her.  Family has provided additional assistance and supervision at home.  She is starting to get back to somewhat of a routine again which is helped her from a emotional standpoint.  She still reports that she struggles sleeping at times and still feels that she is a bit "hyped up" from the hospital stay.  She complains about her left knee brace being difficult to wear as the inner portions of the brace and the tabs from the Velcro rub up against the medial right thigh.  She does wear the brace still as it does help with her knee stability.  Otherwise she has been doing better from a fluid intake standpoint and has been pushing fluids.  She still does urinate frequently and moves her bowels somewhat frequently due to her short gut.  She followed up with her primary care physician Dr. Domenick Gong yesterday and apparently her assessment was good.  Daughter brought with her a record of her blood pressures.  Her systolic has been borderline to high although diastolic pressures have been within normal limits.  From a pain standpoint she is doing well and denies pain except that at her left knee when she ambulates.  Emotionally she is improving although admittedly not settled down quite yet.   Pain Inventory Average Pain 0 Pain Right Now 0 My pain is No pain at this time. Sometimes shoulder pain.  LOCATION OF PAIN  Right shoulder pain, under shoulder blade that come & goes.  FBP10 Number of stools per week: 14 Oral laxative use No  Type of laxative : No need  Enema or suppository use No  History of colostomy No  Incontinent No   BLADDER Pads In and  out cath, frequency   Able to self cath No  Bladder incontinence Yes  Frequent urination No  Leakage with coughing Yes  Difficulty starting stream No  Incomplete bladder emptying Yes    Mobility use a walker how many minutes can you walk? 100 feet without resting. ability to climb steps?  no do you drive?  no  Function retired I need assistance with the following:  dressing, bathing, meal prep, household duties and shopping Do you have any goals in this area?  yes  Neuro/Psych bladder control problems bowel control problems numbness trouble walking  Prior Studies NEW PATIENT  Physicians involved in your care NEW PATIENT   History reviewed. No pertinent family history. Social History   Socioeconomic History  . Marital status: Married    Spouse name: Not on file  . Number of children: Not on file  . Years of education: Not on file  . Highest education level: Not on file  Occupational History  . Not on file  Tobacco Use  . Smoking status: Never Smoker  . Smokeless tobacco: Never Used  Vaping Use  . Vaping Use: Never used  Substance and Sexual Activity  . Alcohol use: Never  . Drug use: Never  . Sexual activity: Not Currently  Other Topics Concern  . Not on file  Social History Narrative  . Not on file   Social Determinants of Health  Financial Resource Strain:   . Difficulty of Paying Living Expenses: Not on file  Food Insecurity:   . Worried About Charity fundraiser in the Last Year: Not on file  . Ran Out of Food in the Last Year: Not on file  Transportation Needs:   . Lack of Transportation (Medical): Not on file  . Lack of Transportation (Non-Medical): Not on file  Physical Activity:   . Days of Exercise per Week: Not on file  . Minutes of Exercise per Session: Not on file  Stress:   . Feeling of Stress : Not on file  Social Connections:   . Frequency of Communication with Friends and Family: Not on file  . Frequency of Social Gatherings  with Friends and Family: Not on file  . Attends Religious Services: Not on file  . Active Member of Clubs or Organizations: Not on file  . Attends Archivist Meetings: Not on file  . Marital Status: Not on file   Past Surgical History:  Procedure Laterality Date  . COLECTOMY     Past Medical History:  Diagnosis Date  . HTN (hypertension)   . Hyperthyroidism    BP (!) 154/58   Pulse 61   Temp 98.7 F (37.1 C)   Ht 5' (1.524 m)   Wt 133 lb 8 oz (60.6 kg)   SpO2 96%   BMI 26.07 kg/m   Opioid Risk Score:   Fall Risk Score:  `1  Depression screen PHQ 2/9  Depression screen PHQ 2/9 12/08/2019  Decreased Interest 0  Down, Depressed, Hopeless 0  PHQ - 2 Score 0  Altered sleeping 1  Tired, decreased energy 0  Change in appetite 0  Feeling bad or failure about yourself  0  Trouble concentrating 0  Moving slowly or fidgety/restless 0  Suicidal thoughts 0  PHQ-9 Score 1   Review of Systems  Constitutional: Negative.   HENT: Negative.   Eyes: Negative.   Respiratory: Negative.   Cardiovascular: Negative.   Gastrointestinal: Negative.   Endocrine: Negative.   Genitourinary: Negative.   Musculoskeletal: Positive for gait problem.  Skin: Negative.   Allergic/Immunologic: Negative.   Neurological: Positive for numbness.  Hematological: Bruises/bleeds easily.  Psychiatric/Behavioral: Negative.   All other systems reviewed and are negative.      Objective:   Physical Exam  Gen: no distress, normal appearing HEENT: oral mucosa pink and moist, NCAT Cardio: Reg rate Chest: normal effort, normal rate of breathing Abd: soft, non-distended Ext: no edema Skin: intact Neuro: alert.  Remains hard of hearing.  Reasonable insight and awareness otherwise.  Cranial nerve exam nonfocal otherwise.  Decreased proprioception LLE, lesser so in left upper extremity. Musculoskeletal: severe genu valgus and pronation of left foot causing severe deformity. Walks on medial  side of left foot essentially despite brace.  She tends to drag the left foot to help move along and really is not swing it at all. Psych: pleasant, normal affect      Assessment & Plan:  Medical Problem List and Plan: 1.  Blurred vision with gait disturbance secondary to right [place] radiata infarction.  Event monitor to be arranged for outpatient             -HH therapies PT, OT--already making gains despite her knee deformity. 2.  Antithrombotics:             -antiplatelet therapy: Aspirin 81 mg daily and Plavix 75 mg daily x3 weeks then aspirin alone 3. Pain Management:  tylenol  -LEFT knee brace fit was discussed. sthe has severe pronation of the left foot along with the genu valgus.  The knee brace itself does not provide enough support for the knee to prevent the ankle from pronating.  Will make referral to [facility] to assess for KAFO.  -She is not a surgical candidate 4. Sleep:  -sleep hygiene was discussed 5. Fluids/Electrolytes/Nutrition: Discussed pushing fluids at home, especially in the morning and early afternoon.  Discussed the fact that her sense of thirst may be altered given her age and that she needs to aggressively stay on top of this.  Daughter is very invested in this as well.  -Follow-up lab work per primary care physician 8.  Hypertension.  Norvasc 10 mg daily, Tenormin 25 mg daily.               -SBP borderline but overall bp's acceptable  -Observe for now.   9.  Hyperlipidemia.  Lipitor 10.  History of colon cancer with colectomy.                -continue appropriate 11.  Anemia of chronic disease.               Hemoglobin 11.0 on 11/18 12.  Incidental finding of nontoxic multinodular goiter.  Follow-up outpatient ultrasound was recommended                 Thirty minutes of face to face patient care time were spent during this visit. All questions were encouraged and answered. Follow up with me in 2 mos.

## 2019-12-08 NOTE — Telephone Encounter (Signed)
Discussed setting up Cindy Smith with a 30 day cardiac event monitor.  Patients daughter will discuss with Cindy Smith and her primary care physician.   She believes it will be overwheming and too difficult for her mother to perform the necessary maintenance of a 30 day cardiac event monitor.  She will contact our office 12/09/2019, to let us know if they want to proceed with the monitor or decline.

## 2019-12-09 ENCOUNTER — Telehealth: Payer: Self-pay | Admitting: *Deleted

## 2019-12-09 NOTE — Telephone Encounter (Signed)
Patients daughter discussed monitor with PCP, who replied, was indifferent to necessity of a Cardiac Event Monitor.  She discussed with her mother, who felt it would be overwhelming .   At this time she would rather focus on her physical and occupational therapy.  They will discuss the necessity at her appointment 01/03/2020 with [facility], but at this time decline the Cardiac Event Monitor.

## 2019-12-09 NOTE — Telephone Encounter (Signed)
Follow Up:; ° ° °Returning your call. °

## 2019-12-13 DIAGNOSIS — R2681 Unsteadiness on feet: Secondary | ICD-10-CM | POA: Diagnosis not present

## 2019-12-13 DIAGNOSIS — D631 Anemia in chronic kidney disease: Secondary | ICD-10-CM | POA: Diagnosis not present

## 2019-12-13 DIAGNOSIS — N1831 Chronic kidney disease, stage 3a: Secondary | ICD-10-CM | POA: Diagnosis not present

## 2019-12-13 DIAGNOSIS — I69312 Visuospatial deficit and spatial neglect following cerebral infarction: Secondary | ICD-10-CM | POA: Diagnosis not present

## 2019-12-13 DIAGNOSIS — I1310 Hypertensive heart and chronic kidney disease without heart failure, with stage 1 through stage 4 chronic kidney disease, or unspecified chronic kidney disease: Secondary | ICD-10-CM | POA: Diagnosis not present

## 2019-12-13 DIAGNOSIS — I69398 Other sequelae of cerebral infarction: Secondary | ICD-10-CM | POA: Diagnosis not present

## 2019-12-14 DIAGNOSIS — I1310 Hypertensive heart and chronic kidney disease without heart failure, with stage 1 through stage 4 chronic kidney disease, or unspecified chronic kidney disease: Secondary | ICD-10-CM | POA: Diagnosis not present

## 2019-12-14 DIAGNOSIS — I69398 Other sequelae of cerebral infarction: Secondary | ICD-10-CM | POA: Diagnosis not present

## 2019-12-14 DIAGNOSIS — D631 Anemia in chronic kidney disease: Secondary | ICD-10-CM | POA: Diagnosis not present

## 2019-12-14 DIAGNOSIS — I69312 Visuospatial deficit and spatial neglect following cerebral infarction: Secondary | ICD-10-CM | POA: Diagnosis not present

## 2019-12-14 DIAGNOSIS — R2681 Unsteadiness on feet: Secondary | ICD-10-CM | POA: Diagnosis not present

## 2019-12-14 DIAGNOSIS — N1831 Chronic kidney disease, stage 3a: Secondary | ICD-10-CM | POA: Diagnosis not present

## 2019-12-15 DIAGNOSIS — I69398 Other sequelae of cerebral infarction: Secondary | ICD-10-CM | POA: Diagnosis not present

## 2019-12-15 DIAGNOSIS — N1831 Chronic kidney disease, stage 3a: Secondary | ICD-10-CM | POA: Diagnosis not present

## 2019-12-15 DIAGNOSIS — I69312 Visuospatial deficit and spatial neglect following cerebral infarction: Secondary | ICD-10-CM | POA: Diagnosis not present

## 2019-12-15 DIAGNOSIS — I1310 Hypertensive heart and chronic kidney disease without heart failure, with stage 1 through stage 4 chronic kidney disease, or unspecified chronic kidney disease: Secondary | ICD-10-CM | POA: Diagnosis not present

## 2019-12-15 DIAGNOSIS — D631 Anemia in chronic kidney disease: Secondary | ICD-10-CM | POA: Diagnosis not present

## 2019-12-15 DIAGNOSIS — R2681 Unsteadiness on feet: Secondary | ICD-10-CM | POA: Diagnosis not present

## 2019-12-16 DIAGNOSIS — N1831 Chronic kidney disease, stage 3a: Secondary | ICD-10-CM | POA: Diagnosis not present

## 2019-12-16 DIAGNOSIS — I1310 Hypertensive heart and chronic kidney disease without heart failure, with stage 1 through stage 4 chronic kidney disease, or unspecified chronic kidney disease: Secondary | ICD-10-CM | POA: Diagnosis not present

## 2019-12-16 DIAGNOSIS — R2681 Unsteadiness on feet: Secondary | ICD-10-CM | POA: Diagnosis not present

## 2019-12-16 DIAGNOSIS — D631 Anemia in chronic kidney disease: Secondary | ICD-10-CM | POA: Diagnosis not present

## 2019-12-16 DIAGNOSIS — I69398 Other sequelae of cerebral infarction: Secondary | ICD-10-CM | POA: Diagnosis not present

## 2019-12-16 DIAGNOSIS — I69312 Visuospatial deficit and spatial neglect following cerebral infarction: Secondary | ICD-10-CM | POA: Diagnosis not present

## 2019-12-17 DIAGNOSIS — D631 Anemia in chronic kidney disease: Secondary | ICD-10-CM | POA: Diagnosis not present

## 2019-12-17 DIAGNOSIS — I1310 Hypertensive heart and chronic kidney disease without heart failure, with stage 1 through stage 4 chronic kidney disease, or unspecified chronic kidney disease: Secondary | ICD-10-CM | POA: Diagnosis not present

## 2019-12-17 DIAGNOSIS — I69398 Other sequelae of cerebral infarction: Secondary | ICD-10-CM | POA: Diagnosis not present

## 2019-12-17 DIAGNOSIS — R2681 Unsteadiness on feet: Secondary | ICD-10-CM | POA: Diagnosis not present

## 2019-12-17 DIAGNOSIS — I69312 Visuospatial deficit and spatial neglect following cerebral infarction: Secondary | ICD-10-CM | POA: Diagnosis not present

## 2019-12-17 DIAGNOSIS — N1831 Chronic kidney disease, stage 3a: Secondary | ICD-10-CM | POA: Diagnosis not present

## 2019-12-20 DIAGNOSIS — R2681 Unsteadiness on feet: Secondary | ICD-10-CM | POA: Diagnosis not present

## 2019-12-20 DIAGNOSIS — Z23 Encounter for immunization: Secondary | ICD-10-CM | POA: Diagnosis not present

## 2019-12-20 DIAGNOSIS — I69312 Visuospatial deficit and spatial neglect following cerebral infarction: Secondary | ICD-10-CM | POA: Diagnosis not present

## 2019-12-20 DIAGNOSIS — N1831 Chronic kidney disease, stage 3a: Secondary | ICD-10-CM | POA: Diagnosis not present

## 2019-12-20 DIAGNOSIS — I1310 Hypertensive heart and chronic kidney disease without heart failure, with stage 1 through stage 4 chronic kidney disease, or unspecified chronic kidney disease: Secondary | ICD-10-CM | POA: Diagnosis not present

## 2019-12-20 DIAGNOSIS — D631 Anemia in chronic kidney disease: Secondary | ICD-10-CM | POA: Diagnosis not present

## 2019-12-20 DIAGNOSIS — I69398 Other sequelae of cerebral infarction: Secondary | ICD-10-CM | POA: Diagnosis not present

## 2019-12-22 DIAGNOSIS — I69398 Other sequelae of cerebral infarction: Secondary | ICD-10-CM | POA: Diagnosis not present

## 2019-12-22 DIAGNOSIS — R2681 Unsteadiness on feet: Secondary | ICD-10-CM | POA: Diagnosis not present

## 2019-12-22 DIAGNOSIS — I1310 Hypertensive heart and chronic kidney disease without heart failure, with stage 1 through stage 4 chronic kidney disease, or unspecified chronic kidney disease: Secondary | ICD-10-CM | POA: Diagnosis not present

## 2019-12-22 DIAGNOSIS — I69312 Visuospatial deficit and spatial neglect following cerebral infarction: Secondary | ICD-10-CM | POA: Diagnosis not present

## 2019-12-22 DIAGNOSIS — D631 Anemia in chronic kidney disease: Secondary | ICD-10-CM | POA: Diagnosis not present

## 2019-12-22 DIAGNOSIS — N1831 Chronic kidney disease, stage 3a: Secondary | ICD-10-CM | POA: Diagnosis not present

## 2019-12-23 DIAGNOSIS — I69312 Visuospatial deficit and spatial neglect following cerebral infarction: Secondary | ICD-10-CM | POA: Diagnosis not present

## 2019-12-23 DIAGNOSIS — D631 Anemia in chronic kidney disease: Secondary | ICD-10-CM | POA: Diagnosis not present

## 2019-12-23 DIAGNOSIS — R2681 Unsteadiness on feet: Secondary | ICD-10-CM | POA: Diagnosis not present

## 2019-12-23 DIAGNOSIS — I1310 Hypertensive heart and chronic kidney disease without heart failure, with stage 1 through stage 4 chronic kidney disease, or unspecified chronic kidney disease: Secondary | ICD-10-CM | POA: Diagnosis not present

## 2019-12-23 DIAGNOSIS — I69398 Other sequelae of cerebral infarction: Secondary | ICD-10-CM | POA: Diagnosis not present

## 2019-12-23 DIAGNOSIS — N1831 Chronic kidney disease, stage 3a: Secondary | ICD-10-CM | POA: Diagnosis not present

## 2019-12-24 DIAGNOSIS — R2681 Unsteadiness on feet: Secondary | ICD-10-CM | POA: Diagnosis not present

## 2019-12-24 DIAGNOSIS — D631 Anemia in chronic kidney disease: Secondary | ICD-10-CM | POA: Diagnosis not present

## 2019-12-24 DIAGNOSIS — I1310 Hypertensive heart and chronic kidney disease without heart failure, with stage 1 through stage 4 chronic kidney disease, or unspecified chronic kidney disease: Secondary | ICD-10-CM | POA: Diagnosis not present

## 2019-12-24 DIAGNOSIS — I69312 Visuospatial deficit and spatial neglect following cerebral infarction: Secondary | ICD-10-CM | POA: Diagnosis not present

## 2019-12-24 DIAGNOSIS — I69398 Other sequelae of cerebral infarction: Secondary | ICD-10-CM | POA: Diagnosis not present

## 2019-12-24 DIAGNOSIS — N1831 Chronic kidney disease, stage 3a: Secondary | ICD-10-CM | POA: Diagnosis not present

## 2019-12-27 DIAGNOSIS — R2681 Unsteadiness on feet: Secondary | ICD-10-CM | POA: Diagnosis not present

## 2019-12-27 DIAGNOSIS — N1831 Chronic kidney disease, stage 3a: Secondary | ICD-10-CM | POA: Diagnosis not present

## 2019-12-27 DIAGNOSIS — I69398 Other sequelae of cerebral infarction: Secondary | ICD-10-CM | POA: Diagnosis not present

## 2019-12-27 DIAGNOSIS — D631 Anemia in chronic kidney disease: Secondary | ICD-10-CM | POA: Diagnosis not present

## 2019-12-27 DIAGNOSIS — I69312 Visuospatial deficit and spatial neglect following cerebral infarction: Secondary | ICD-10-CM | POA: Diagnosis not present

## 2019-12-27 DIAGNOSIS — I1310 Hypertensive heart and chronic kidney disease without heart failure, with stage 1 through stage 4 chronic kidney disease, or unspecified chronic kidney disease: Secondary | ICD-10-CM | POA: Diagnosis not present

## 2019-12-28 DIAGNOSIS — I69398 Other sequelae of cerebral infarction: Secondary | ICD-10-CM | POA: Diagnosis not present

## 2019-12-28 DIAGNOSIS — N1831 Chronic kidney disease, stage 3a: Secondary | ICD-10-CM | POA: Diagnosis not present

## 2019-12-28 DIAGNOSIS — I69312 Visuospatial deficit and spatial neglect following cerebral infarction: Secondary | ICD-10-CM | POA: Diagnosis not present

## 2019-12-28 DIAGNOSIS — D631 Anemia in chronic kidney disease: Secondary | ICD-10-CM | POA: Diagnosis not present

## 2019-12-28 DIAGNOSIS — I1310 Hypertensive heart and chronic kidney disease without heart failure, with stage 1 through stage 4 chronic kidney disease, or unspecified chronic kidney disease: Secondary | ICD-10-CM | POA: Diagnosis not present

## 2019-12-28 DIAGNOSIS — R2681 Unsteadiness on feet: Secondary | ICD-10-CM | POA: Diagnosis not present

## 2019-12-29 DIAGNOSIS — I69312 Visuospatial deficit and spatial neglect following cerebral infarction: Secondary | ICD-10-CM | POA: Diagnosis not present

## 2019-12-29 DIAGNOSIS — D631 Anemia in chronic kidney disease: Secondary | ICD-10-CM | POA: Diagnosis not present

## 2019-12-29 DIAGNOSIS — N1831 Chronic kidney disease, stage 3a: Secondary | ICD-10-CM | POA: Diagnosis not present

## 2019-12-29 DIAGNOSIS — R2681 Unsteadiness on feet: Secondary | ICD-10-CM | POA: Diagnosis not present

## 2019-12-29 DIAGNOSIS — I1310 Hypertensive heart and chronic kidney disease without heart failure, with stage 1 through stage 4 chronic kidney disease, or unspecified chronic kidney disease: Secondary | ICD-10-CM | POA: Diagnosis not present

## 2019-12-29 DIAGNOSIS — I69398 Other sequelae of cerebral infarction: Secondary | ICD-10-CM | POA: Diagnosis not present

## 2019-12-31 DIAGNOSIS — D631 Anemia in chronic kidney disease: Secondary | ICD-10-CM | POA: Diagnosis not present

## 2019-12-31 DIAGNOSIS — N1831 Chronic kidney disease, stage 3a: Secondary | ICD-10-CM | POA: Diagnosis not present

## 2019-12-31 DIAGNOSIS — I69312 Visuospatial deficit and spatial neglect following cerebral infarction: Secondary | ICD-10-CM | POA: Diagnosis not present

## 2019-12-31 DIAGNOSIS — I1310 Hypertensive heart and chronic kidney disease without heart failure, with stage 1 through stage 4 chronic kidney disease, or unspecified chronic kidney disease: Secondary | ICD-10-CM | POA: Diagnosis not present

## 2019-12-31 DIAGNOSIS — I69398 Other sequelae of cerebral infarction: Secondary | ICD-10-CM | POA: Diagnosis not present

## 2019-12-31 DIAGNOSIS — R2681 Unsteadiness on feet: Secondary | ICD-10-CM | POA: Diagnosis not present

## 2020-01-02 DIAGNOSIS — Z85038 Personal history of other malignant neoplasm of large intestine: Secondary | ICD-10-CM | POA: Diagnosis not present

## 2020-01-02 DIAGNOSIS — M1711 Unilateral primary osteoarthritis, right knee: Secondary | ICD-10-CM | POA: Diagnosis not present

## 2020-01-02 DIAGNOSIS — M47812 Spondylosis without myelopathy or radiculopathy, cervical region: Secondary | ICD-10-CM | POA: Diagnosis not present

## 2020-01-02 DIAGNOSIS — Z9181 History of falling: Secondary | ICD-10-CM | POA: Diagnosis not present

## 2020-01-02 DIAGNOSIS — E042 Nontoxic multinodular goiter: Secondary | ICD-10-CM | POA: Diagnosis not present

## 2020-01-02 DIAGNOSIS — R2681 Unsteadiness on feet: Secondary | ICD-10-CM | POA: Diagnosis not present

## 2020-01-02 DIAGNOSIS — N1831 Chronic kidney disease, stage 3a: Secondary | ICD-10-CM | POA: Diagnosis not present

## 2020-01-02 DIAGNOSIS — Z7982 Long term (current) use of aspirin: Secondary | ICD-10-CM | POA: Diagnosis not present

## 2020-01-02 DIAGNOSIS — I69312 Visuospatial deficit and spatial neglect following cerebral infarction: Secondary | ICD-10-CM | POA: Diagnosis not present

## 2020-01-02 DIAGNOSIS — D631 Anemia in chronic kidney disease: Secondary | ICD-10-CM | POA: Diagnosis not present

## 2020-01-02 DIAGNOSIS — I69398 Other sequelae of cerebral infarction: Secondary | ICD-10-CM | POA: Diagnosis not present

## 2020-01-02 DIAGNOSIS — I1310 Hypertensive heart and chronic kidney disease without heart failure, with stage 1 through stage 4 chronic kidney disease, or unspecified chronic kidney disease: Secondary | ICD-10-CM | POA: Diagnosis not present

## 2020-01-02 DIAGNOSIS — I083 Combined rheumatic disorders of mitral, aortic and tricuspid valves: Secondary | ICD-10-CM | POA: Diagnosis not present

## 2020-01-02 DIAGNOSIS — H547 Unspecified visual loss: Secondary | ICD-10-CM | POA: Diagnosis not present

## 2020-01-02 DIAGNOSIS — E785 Hyperlipidemia, unspecified: Secondary | ICD-10-CM | POA: Diagnosis not present

## 2020-01-03 ENCOUNTER — Encounter: Payer: Self-pay | Admitting: Adult Health

## 2020-01-03 ENCOUNTER — Ambulatory Visit (INDEPENDENT_AMBULATORY_CARE_PROVIDER_SITE_OTHER): Payer: Medicare Other | Admitting: Adult Health

## 2020-01-03 VITALS — BP 156/68 | HR 65 | Ht 60.0 in | Wt 134.0 lb

## 2020-01-03 DIAGNOSIS — I10 Essential (primary) hypertension: Secondary | ICD-10-CM | POA: Diagnosis not present

## 2020-01-03 DIAGNOSIS — I6381 Other cerebral infarction due to occlusion or stenosis of small artery: Secondary | ICD-10-CM

## 2020-01-03 DIAGNOSIS — E785 Hyperlipidemia, unspecified: Secondary | ICD-10-CM

## 2020-01-03 NOTE — Progress Notes (Signed)
[facility] [address] [place]. [place] [address] [phone]       HOSPITAL FOLLOW UP NOTE  [name] Date of Birth:  1923-09-15 Medical Record Number:  [id number]   Reason for Referral:  hospital stroke follow up    SUBJECTIVE:   CHIEF COMPLAINT:  Chief Complaint  Patient presents with  . Hospitalization Follow-up    pt here fora stroke f/u. Pt said she is having trouble sleeping and is not making progress wth walking like before.  . Cerebrovascular Accident    HPI:   [name] is a 86 y.o. female with history of HTN who presented on 11/17/2019 with blurred vision since Friday 10/8.  Personally reviewed hospitalization pertinent progress notes, lab work and imaging with summary provided.  Evaluated by Dr. Leonie Man with stroke work-up revealing right basal ganglia infarct, larger than a typical small vessel disease infarct (2cm) therefore possible embolic source.  Recommended 30-day cardiac event monitor to assess for AF and consider ILR if negative.  Initiated DAPT for 3 weeks and aspirin alone.  Presented in hypertensive urgency with BP 235/80 and long-term BP goal normotensive range.  LDL 132 and initiated atorvastatin 40 mg daily.  Other stroke risk factors include advanced age but no prior stroke history.  Other active problems include AKI with dehydration, normocytic anemia, hyponatremia, hx of colon cancer s/p colectomy, BLE neuropathy, asymptomatic bacteriuria, and right thyroid lobe heterogeneous and enlarged ([place] recommended).  Evaluated by therapies and recommended discharge to CIR for ongoing therapy needs.  Stroke:   R basal ganglia infarct, larger than a typical small vessel disease infarct, possible embolic source  CT head No acute abnormality. Small vessel disease. Atrophy.   MRI  2 cm R corona radiata infarct. Small vessel disease.   MR CS  Mild spondylolisthesis C2-3, C7-T1, widespread CS degeneration, no cord  signal abnormality. Widespread moderate and severe degnerative foraminal stenosis   CTA head & neck proximal R ICA 50% stenosis. R thyroid lobe heterogeneous and enlarged, [place] recommended  2D Echo EF 65-70%. No source of embolus   Given age 59, OP 30d monitor to look for AF, consider loop if 30 d neg     LDL 132  HgbA1c 5.4  VTE prophylaxis - SCDs   No antithrombotic prior to admission, now on aspirin 81 mg daily and clopidogrel 75 mg daily following plavix load.  Treat w/ DAPT x 3 weeks then aspirin alone  Therapy recommendations:  CIR  Disposition:  CIR - independent and lives alone PTA  Today, 01/03/2020, Cindy Smith is being seen for hospital follow-up accompanied by her daughter.  She was discharged home from CIR on 12/01/2019 after a 12-day stay.  Reports residual gait impairment and left leg weakness.  She has been working with home health PT/OT with improvement.  History of L knee injury which has been impacting recovery and plans on receiving a brace to help with support and stabilization on 12/13.  Currently ambulating with Rollator walker which she used PTA.  Denies new or worsening stroke/TIA symptoms.  Completed 3 weeks DAPT and remains on aspirin alone without bleeding or bruising.  Remains on atorvastatin without myalgias.  Daughter reports recent lipid panel completed by PCP with LDL<70 (unable to personally view via epic).  Blood pressure today initially elevated and on recheck 156/68 which is baseline per daughter.  Daughter questions indication/need for cardiac monitoring as she is concerned that would be too overwhelming for her mother to use.  Patient also is adamant that she does not want to be on any " blood thinners" due to fall concerns although denies any recent falls.  No further concerns at this time.    ROS:   14 system review of systems performed and negative with exception of those listed in HPI  PMH:  Past Medical History:  Diagnosis Date  . HTN  (hypertension)   . Hyperthyroidism     PSH:  Past Surgical History:  Procedure Laterality Date  . COLECTOMY      Social History:  Social History   Socioeconomic History  . Marital status: Married    Spouse name: Not on file  . Number of children: Not on file  . Years of education: Not on file  . Highest education level: Not on file  Occupational History  . Not on file  Tobacco Use  . Smoking status: Never Smoker  . Smokeless tobacco: Never Used  Vaping Use  . Vaping Use: Never used  Substance and Sexual Activity  . Alcohol use: Never  . Drug use: Never  . Sexual activity: Not Currently  Other Topics Concern  . Not on file  Social History Narrative  . Not on file   Social Determinants of Health   Financial Resource Strain:   . Difficulty of Paying Living Expenses: Not on file  Food Insecurity:   . Worried About Charity fundraiser in the Last Year: Not on file  . Ran Out of Food in the Last Year: Not on file  Transportation Needs:   . Lack of Transportation (Medical): Not on file  . Lack of Transportation (Non-Medical): Not on file  Physical Activity:   . Days of Exercise per Week: Not on file  . Minutes of Exercise per Session: Not on file  Stress:   . Feeling of Stress : Not on file  Social Connections:   . Frequency of Communication with Friends and Family: Not on file  . Frequency of Social Gatherings with Friends and Family: Not on file  . Attends Religious Services: Not on file  . Active Member of Clubs or Organizations: Not on file  . Attends Archivist Meetings: Not on file  . Marital Status: Not on file  Intimate Partner Violence:   . Fear of Current or Ex-Partner: Not on file  . Emotionally Abused: Not on file  . Physically Abused: Not on file  . Sexually Abused: Not on file    Family History: No family history on file.  Medications:   Current Outpatient Medications on File Prior to Visit  Medication Sig Dispense Refill  .  atenolol (TENORMIN) 25 MG tablet Take 1 tablet (25 mg total) by mouth daily. 30 tablet 0  . cyanocobalamin 1000 MCG tablet Take 1 tablet (1,000 mcg total) by mouth daily. 30 tablet 0  . latanoprost (XALATAN) 0.005 % ophthalmic solution Place 1 drop into both eyes every evening.    . saccharomyces boulardii (FLORASTOR) 250 MG capsule Take 1 capsule (250 mg total) by mouth 2 (two) times daily. 60 capsule 0  . VITAMIN D, CHOLECALCIFEROL, PO Take 1 tablet by mouth daily.    Marland Kitchen amLODipine (NORVASC) 10 MG tablet Take 1 tablet (10 mg total) by mouth daily. 30 tablet 0  . atorvastatin (LIPITOR) 40 MG tablet Take 1 tablet (40 mg total) by mouth daily. 30 tablet 0   No current facility-administered medications on file prior to visit.    Allergies:   Allergies  Allergen  Reactions  . Codeine     "I just pass out"  . Other Other (See Comments)    Steroid (Lotemax eye drops)      OBJECTIVE:  Physical Exam  Vitals:   01/03/20 1359 01/03/20 1459  BP: (!) 170/68 (!) 156/68  Pulse: 65   Weight: 134 lb (60.8 kg)   Height: 5' (1.524 m)    Body mass index is 26.17 kg/m. No exam data present  General: well developed, well nourished,  pleasant elderly Caucasian female, seated, in no evident distress Head: head normocephalic and atraumatic.   Neck: supple with no carotid or supraclavicular bruits Cardiovascular: regular rate and rhythm, no murmurs Musculoskeletal: no deformity Skin:  no rash/petichiae Vascular:  Normal pulses all extremities   Neurologic Exam Mental Status: Awake and fully alert.   Fluent speech and language.  Oriented to place and time. Recent and remote memory intact. Attention span, concentration and fund of knowledge appropriate. Mood and affect appropriate.  Cranial Nerves: Fundoscopic exam reveals sharp disc margins. Pupils equal, briskly reactive to light. Extraocular movements full without nystagmus. Visual fields full to confrontation. Hearing intact. Facial sensation  intact. Face, tongue, palate moves normally and symmetrically.  Motor: Normal bulk and tone. Normal strength in all tested extremity muscles although difficulty fully assessing LLE due to prior knee injury. Sensory.: intact to touch , pinprick , position and vibratory sensation.  Coordination: Rapid alternating movements normal in all extremities. Finger-to-nose performed accurately bilaterally and heel-to-shin performed accurately RLE -unable to adequately assess LLE Gait and Station: Deferred as patient in wheelchair without RW present Reflexes: 1+ and symmetric. Toes downgoing.     NIHSS  0 Modified Rankin  3    ASSESSMENT: Cindy Smith is a 86 y.o. year old female presented with 5 day hx of blurred vision on 11/17/2019 with stroke work-up revealing R BG infarct, larger than a typical small vessel disease infarct at 2cm therefore concern of embolic source. Vascular risk factors include advanced age, HTN, HLD and R ICA 50% stenosis.      PLAN:  1. R BG stroke :  a. Residual deficit: Gait impairment and possible LLE weakness.  Left knee injury PTA interfering with further recovery and currently awaiting a brace to further help with support and stability.  Advised to continue to follow with [facility] PT/OT and may possibly need outpatient therapy if indicated once completed b. Continue aspirin 81 mg daily  and atorvastatin for secondary stroke prevention.  c. Recommended cardiac monitor due to size of infarct but patient declines at this time.  She is also currently adamant that she is not interested in anticoagulants due to possible bleeding risk.  Advised that we can further discuss this at follow-up visit and she is currently overwhelmed with frequent doctor's appointments and therapy sessions d. Discussed secondary stroke prevention measures and importance of close PCP follow up for aggressive stroke risk factor management  2. HTN: BP goal <130/90.  At baseline monitor by PCP 3. HLD:  LDL goal <70. Recent LDL satisfactory per daughter with admission LDL 132.  Initiated atorvastatin 40 mg daily during stroke admission.  Advised continuation and ongoing monitoring and prescribing by PCP    Follow up in 3 months or call earlier if needed   CC:  [facility] provider: Dr. Marda Stalker, Fransico Him, MD    I spent 50 minutes of face-to-face and non-face-to-face time with patient and daughter.  This included previsit chart review, lab review, study review, order entry, electronic  health record documentation, patient education regarding recent stroke, residual deficits, importance of managing stroke risk factors and answered all questions to patient satisfaction   Frann Rider, [facility]  [facility] [address] [place], [place] [address]  Phone [phone] Fax [phone] Note: This document was prepared with digital dictation and possible smart phrase technology. Any transcriptional errors that result from this process are unintentional.

## 2020-01-03 NOTE — Patient Instructions (Addendum)
Continue working with [facility] PT/OT for ongoing recovery - may need additional outpatient therapies once completed  Continue aspirin 81 mg daily  and atorvastatin  for secondary stroke prevention  Continue to follow up with PCP regarding cholesterol and blood pressure management  Maintain strict control of hypertension with blood pressure goal below 130/90 and cholesterol with LDL cholesterol (bad cholesterol) goal below 70 mg/dL.     Followup in the future with me in 3 months or call earlier if needed     Thank you for coming to see [place] at [facility]. I hope we have been able to provide you high quality care today.  You may receive a patient satisfaction survey over the next few weeks. We would appreciate your feedback and comments so that we may continue to improve ourselves and the health of our patients.

## 2020-01-03 NOTE — Progress Notes (Signed)
I agree with the above plan 

## 2020-01-04 DIAGNOSIS — I69312 Visuospatial deficit and spatial neglect following cerebral infarction: Secondary | ICD-10-CM | POA: Diagnosis not present

## 2020-01-04 DIAGNOSIS — I1310 Hypertensive heart and chronic kidney disease without heart failure, with stage 1 through stage 4 chronic kidney disease, or unspecified chronic kidney disease: Secondary | ICD-10-CM | POA: Diagnosis not present

## 2020-01-04 DIAGNOSIS — I69398 Other sequelae of cerebral infarction: Secondary | ICD-10-CM | POA: Diagnosis not present

## 2020-01-04 DIAGNOSIS — R2681 Unsteadiness on feet: Secondary | ICD-10-CM | POA: Diagnosis not present

## 2020-01-04 DIAGNOSIS — N1831 Chronic kidney disease, stage 3a: Secondary | ICD-10-CM | POA: Diagnosis not present

## 2020-01-04 DIAGNOSIS — D631 Anemia in chronic kidney disease: Secondary | ICD-10-CM | POA: Diagnosis not present

## 2020-01-05 DIAGNOSIS — I1310 Hypertensive heart and chronic kidney disease without heart failure, with stage 1 through stage 4 chronic kidney disease, or unspecified chronic kidney disease: Secondary | ICD-10-CM | POA: Diagnosis not present

## 2020-01-05 DIAGNOSIS — R2681 Unsteadiness on feet: Secondary | ICD-10-CM | POA: Diagnosis not present

## 2020-01-05 DIAGNOSIS — D631 Anemia in chronic kidney disease: Secondary | ICD-10-CM | POA: Diagnosis not present

## 2020-01-05 DIAGNOSIS — N1831 Chronic kidney disease, stage 3a: Secondary | ICD-10-CM | POA: Diagnosis not present

## 2020-01-05 DIAGNOSIS — I69312 Visuospatial deficit and spatial neglect following cerebral infarction: Secondary | ICD-10-CM | POA: Diagnosis not present

## 2020-01-05 DIAGNOSIS — I69398 Other sequelae of cerebral infarction: Secondary | ICD-10-CM | POA: Diagnosis not present

## 2020-01-06 ENCOUNTER — Other Ambulatory Visit: Payer: Self-pay | Admitting: Physical Medicine and Rehabilitation

## 2020-01-06 DIAGNOSIS — I1310 Hypertensive heart and chronic kidney disease without heart failure, with stage 1 through stage 4 chronic kidney disease, or unspecified chronic kidney disease: Secondary | ICD-10-CM | POA: Diagnosis not present

## 2020-01-06 DIAGNOSIS — I69398 Other sequelae of cerebral infarction: Secondary | ICD-10-CM | POA: Diagnosis not present

## 2020-01-06 DIAGNOSIS — N1831 Chronic kidney disease, stage 3a: Secondary | ICD-10-CM | POA: Diagnosis not present

## 2020-01-06 DIAGNOSIS — R2681 Unsteadiness on feet: Secondary | ICD-10-CM | POA: Diagnosis not present

## 2020-01-06 DIAGNOSIS — D631 Anemia in chronic kidney disease: Secondary | ICD-10-CM | POA: Diagnosis not present

## 2020-01-06 DIAGNOSIS — I69312 Visuospatial deficit and spatial neglect following cerebral infarction: Secondary | ICD-10-CM | POA: Diagnosis not present

## 2020-01-12 DIAGNOSIS — I69312 Visuospatial deficit and spatial neglect following cerebral infarction: Secondary | ICD-10-CM | POA: Diagnosis not present

## 2020-01-12 DIAGNOSIS — I69398 Other sequelae of cerebral infarction: Secondary | ICD-10-CM | POA: Diagnosis not present

## 2020-01-12 DIAGNOSIS — D631 Anemia in chronic kidney disease: Secondary | ICD-10-CM | POA: Diagnosis not present

## 2020-01-12 DIAGNOSIS — N1831 Chronic kidney disease, stage 3a: Secondary | ICD-10-CM | POA: Diagnosis not present

## 2020-01-12 DIAGNOSIS — I1310 Hypertensive heart and chronic kidney disease without heart failure, with stage 1 through stage 4 chronic kidney disease, or unspecified chronic kidney disease: Secondary | ICD-10-CM | POA: Diagnosis not present

## 2020-01-12 DIAGNOSIS — R2681 Unsteadiness on feet: Secondary | ICD-10-CM | POA: Diagnosis not present

## 2020-01-19 DIAGNOSIS — I69312 Visuospatial deficit and spatial neglect following cerebral infarction: Secondary | ICD-10-CM | POA: Diagnosis not present

## 2020-01-19 DIAGNOSIS — D631 Anemia in chronic kidney disease: Secondary | ICD-10-CM | POA: Diagnosis not present

## 2020-01-19 DIAGNOSIS — I69398 Other sequelae of cerebral infarction: Secondary | ICD-10-CM | POA: Diagnosis not present

## 2020-01-19 DIAGNOSIS — N1831 Chronic kidney disease, stage 3a: Secondary | ICD-10-CM | POA: Diagnosis not present

## 2020-01-19 DIAGNOSIS — I1310 Hypertensive heart and chronic kidney disease without heart failure, with stage 1 through stage 4 chronic kidney disease, or unspecified chronic kidney disease: Secondary | ICD-10-CM | POA: Diagnosis not present

## 2020-01-19 DIAGNOSIS — R2681 Unsteadiness on feet: Secondary | ICD-10-CM | POA: Diagnosis not present

## 2020-01-20 DIAGNOSIS — N1831 Chronic kidney disease, stage 3a: Secondary | ICD-10-CM | POA: Diagnosis not present

## 2020-01-20 DIAGNOSIS — I1310 Hypertensive heart and chronic kidney disease without heart failure, with stage 1 through stage 4 chronic kidney disease, or unspecified chronic kidney disease: Secondary | ICD-10-CM | POA: Diagnosis not present

## 2020-01-20 DIAGNOSIS — D631 Anemia in chronic kidney disease: Secondary | ICD-10-CM | POA: Diagnosis not present

## 2020-01-20 DIAGNOSIS — R2681 Unsteadiness on feet: Secondary | ICD-10-CM | POA: Diagnosis not present

## 2020-01-20 DIAGNOSIS — I69398 Other sequelae of cerebral infarction: Secondary | ICD-10-CM | POA: Diagnosis not present

## 2020-01-20 DIAGNOSIS — I69312 Visuospatial deficit and spatial neglect following cerebral infarction: Secondary | ICD-10-CM | POA: Diagnosis not present

## 2020-01-21 DIAGNOSIS — I69312 Visuospatial deficit and spatial neglect following cerebral infarction: Secondary | ICD-10-CM | POA: Diagnosis not present

## 2020-01-21 DIAGNOSIS — I69398 Other sequelae of cerebral infarction: Secondary | ICD-10-CM | POA: Diagnosis not present

## 2020-01-21 DIAGNOSIS — N1831 Chronic kidney disease, stage 3a: Secondary | ICD-10-CM | POA: Diagnosis not present

## 2020-01-21 DIAGNOSIS — R2681 Unsteadiness on feet: Secondary | ICD-10-CM | POA: Diagnosis not present

## 2020-01-21 DIAGNOSIS — I1310 Hypertensive heart and chronic kidney disease without heart failure, with stage 1 through stage 4 chronic kidney disease, or unspecified chronic kidney disease: Secondary | ICD-10-CM | POA: Diagnosis not present

## 2020-01-21 DIAGNOSIS — D631 Anemia in chronic kidney disease: Secondary | ICD-10-CM | POA: Diagnosis not present

## 2020-01-24 DIAGNOSIS — D631 Anemia in chronic kidney disease: Secondary | ICD-10-CM | POA: Diagnosis not present

## 2020-01-24 DIAGNOSIS — I69312 Visuospatial deficit and spatial neglect following cerebral infarction: Secondary | ICD-10-CM | POA: Diagnosis not present

## 2020-01-24 DIAGNOSIS — R2681 Unsteadiness on feet: Secondary | ICD-10-CM | POA: Diagnosis not present

## 2020-01-24 DIAGNOSIS — N1831 Chronic kidney disease, stage 3a: Secondary | ICD-10-CM | POA: Diagnosis not present

## 2020-01-24 DIAGNOSIS — I69398 Other sequelae of cerebral infarction: Secondary | ICD-10-CM | POA: Diagnosis not present

## 2020-01-24 DIAGNOSIS — I1310 Hypertensive heart and chronic kidney disease without heart failure, with stage 1 through stage 4 chronic kidney disease, or unspecified chronic kidney disease: Secondary | ICD-10-CM | POA: Diagnosis not present

## 2020-01-27 DIAGNOSIS — D631 Anemia in chronic kidney disease: Secondary | ICD-10-CM | POA: Diagnosis not present

## 2020-01-27 DIAGNOSIS — I1310 Hypertensive heart and chronic kidney disease without heart failure, with stage 1 through stage 4 chronic kidney disease, or unspecified chronic kidney disease: Secondary | ICD-10-CM | POA: Diagnosis not present

## 2020-01-27 DIAGNOSIS — N1831 Chronic kidney disease, stage 3a: Secondary | ICD-10-CM | POA: Diagnosis not present

## 2020-01-27 DIAGNOSIS — R2681 Unsteadiness on feet: Secondary | ICD-10-CM | POA: Diagnosis not present

## 2020-01-27 DIAGNOSIS — I69398 Other sequelae of cerebral infarction: Secondary | ICD-10-CM | POA: Diagnosis not present

## 2020-01-27 DIAGNOSIS — I69312 Visuospatial deficit and spatial neglect following cerebral infarction: Secondary | ICD-10-CM | POA: Diagnosis not present

## 2020-02-01 DIAGNOSIS — N1831 Chronic kidney disease, stage 3a: Secondary | ICD-10-CM | POA: Diagnosis not present

## 2020-02-01 DIAGNOSIS — I69398 Other sequelae of cerebral infarction: Secondary | ICD-10-CM | POA: Diagnosis not present

## 2020-02-01 DIAGNOSIS — E042 Nontoxic multinodular goiter: Secondary | ICD-10-CM | POA: Diagnosis not present

## 2020-02-01 DIAGNOSIS — Z85038 Personal history of other malignant neoplasm of large intestine: Secondary | ICD-10-CM | POA: Diagnosis not present

## 2020-02-01 DIAGNOSIS — R2681 Unsteadiness on feet: Secondary | ICD-10-CM | POA: Diagnosis not present

## 2020-02-01 DIAGNOSIS — M1711 Unilateral primary osteoarthritis, right knee: Secondary | ICD-10-CM | POA: Diagnosis not present

## 2020-02-01 DIAGNOSIS — M21062 Valgus deformity, not elsewhere classified, left knee: Secondary | ICD-10-CM | POA: Diagnosis not present

## 2020-02-01 DIAGNOSIS — I083 Combined rheumatic disorders of mitral, aortic and tricuspid valves: Secondary | ICD-10-CM | POA: Diagnosis not present

## 2020-02-01 DIAGNOSIS — Z9181 History of falling: Secondary | ICD-10-CM | POA: Diagnosis not present

## 2020-02-01 DIAGNOSIS — Z7982 Long term (current) use of aspirin: Secondary | ICD-10-CM | POA: Diagnosis not present

## 2020-02-01 DIAGNOSIS — I69312 Visuospatial deficit and spatial neglect following cerebral infarction: Secondary | ICD-10-CM | POA: Diagnosis not present

## 2020-02-01 DIAGNOSIS — H547 Unspecified visual loss: Secondary | ICD-10-CM | POA: Diagnosis not present

## 2020-02-01 DIAGNOSIS — M47812 Spondylosis without myelopathy or radiculopathy, cervical region: Secondary | ICD-10-CM | POA: Diagnosis not present

## 2020-02-01 DIAGNOSIS — D631 Anemia in chronic kidney disease: Secondary | ICD-10-CM | POA: Diagnosis not present

## 2020-02-01 DIAGNOSIS — E785 Hyperlipidemia, unspecified: Secondary | ICD-10-CM | POA: Diagnosis not present

## 2020-02-01 DIAGNOSIS — I1310 Hypertensive heart and chronic kidney disease without heart failure, with stage 1 through stage 4 chronic kidney disease, or unspecified chronic kidney disease: Secondary | ICD-10-CM | POA: Diagnosis not present

## 2020-02-07 DIAGNOSIS — R2681 Unsteadiness on feet: Secondary | ICD-10-CM | POA: Diagnosis not present

## 2020-02-07 DIAGNOSIS — M21062 Valgus deformity, not elsewhere classified, left knee: Secondary | ICD-10-CM | POA: Diagnosis not present

## 2020-02-07 DIAGNOSIS — M1711 Unilateral primary osteoarthritis, right knee: Secondary | ICD-10-CM | POA: Diagnosis not present

## 2020-02-07 DIAGNOSIS — M47812 Spondylosis without myelopathy or radiculopathy, cervical region: Secondary | ICD-10-CM | POA: Diagnosis not present

## 2020-02-07 DIAGNOSIS — I1310 Hypertensive heart and chronic kidney disease without heart failure, with stage 1 through stage 4 chronic kidney disease, or unspecified chronic kidney disease: Secondary | ICD-10-CM | POA: Diagnosis not present

## 2020-02-07 DIAGNOSIS — I69398 Other sequelae of cerebral infarction: Secondary | ICD-10-CM | POA: Diagnosis not present

## 2020-02-09 ENCOUNTER — Encounter: Payer: Medicare Other | Admitting: Physical Medicine & Rehabilitation

## 2020-02-10 DIAGNOSIS — M47812 Spondylosis without myelopathy or radiculopathy, cervical region: Secondary | ICD-10-CM | POA: Diagnosis not present

## 2020-02-10 DIAGNOSIS — I69398 Other sequelae of cerebral infarction: Secondary | ICD-10-CM | POA: Diagnosis not present

## 2020-02-10 DIAGNOSIS — R2681 Unsteadiness on feet: Secondary | ICD-10-CM | POA: Diagnosis not present

## 2020-02-10 DIAGNOSIS — M21062 Valgus deformity, not elsewhere classified, left knee: Secondary | ICD-10-CM | POA: Diagnosis not present

## 2020-02-10 DIAGNOSIS — M1711 Unilateral primary osteoarthritis, right knee: Secondary | ICD-10-CM | POA: Diagnosis not present

## 2020-02-10 DIAGNOSIS — I1310 Hypertensive heart and chronic kidney disease without heart failure, with stage 1 through stage 4 chronic kidney disease, or unspecified chronic kidney disease: Secondary | ICD-10-CM | POA: Diagnosis not present

## 2020-02-15 DIAGNOSIS — R2681 Unsteadiness on feet: Secondary | ICD-10-CM | POA: Diagnosis not present

## 2020-02-15 DIAGNOSIS — I69398 Other sequelae of cerebral infarction: Secondary | ICD-10-CM | POA: Diagnosis not present

## 2020-02-15 DIAGNOSIS — M1711 Unilateral primary osteoarthritis, right knee: Secondary | ICD-10-CM | POA: Diagnosis not present

## 2020-02-15 DIAGNOSIS — I1310 Hypertensive heart and chronic kidney disease without heart failure, with stage 1 through stage 4 chronic kidney disease, or unspecified chronic kidney disease: Secondary | ICD-10-CM | POA: Diagnosis not present

## 2020-02-15 DIAGNOSIS — M47812 Spondylosis without myelopathy or radiculopathy, cervical region: Secondary | ICD-10-CM | POA: Diagnosis not present

## 2020-02-15 DIAGNOSIS — M21062 Valgus deformity, not elsewhere classified, left knee: Secondary | ICD-10-CM | POA: Diagnosis not present

## 2020-02-17 DIAGNOSIS — I69398 Other sequelae of cerebral infarction: Secondary | ICD-10-CM | POA: Diagnosis not present

## 2020-02-17 DIAGNOSIS — M1711 Unilateral primary osteoarthritis, right knee: Secondary | ICD-10-CM | POA: Diagnosis not present

## 2020-02-17 DIAGNOSIS — M47812 Spondylosis without myelopathy or radiculopathy, cervical region: Secondary | ICD-10-CM | POA: Diagnosis not present

## 2020-02-17 DIAGNOSIS — I1310 Hypertensive heart and chronic kidney disease without heart failure, with stage 1 through stage 4 chronic kidney disease, or unspecified chronic kidney disease: Secondary | ICD-10-CM | POA: Diagnosis not present

## 2020-02-17 DIAGNOSIS — M21062 Valgus deformity, not elsewhere classified, left knee: Secondary | ICD-10-CM | POA: Diagnosis not present

## 2020-02-17 DIAGNOSIS — R2681 Unsteadiness on feet: Secondary | ICD-10-CM | POA: Diagnosis not present

## 2020-02-22 DIAGNOSIS — M21062 Valgus deformity, not elsewhere classified, left knee: Secondary | ICD-10-CM | POA: Diagnosis not present

## 2020-02-22 DIAGNOSIS — R2681 Unsteadiness on feet: Secondary | ICD-10-CM | POA: Diagnosis not present

## 2020-02-22 DIAGNOSIS — M47812 Spondylosis without myelopathy or radiculopathy, cervical region: Secondary | ICD-10-CM | POA: Diagnosis not present

## 2020-02-22 DIAGNOSIS — I69398 Other sequelae of cerebral infarction: Secondary | ICD-10-CM | POA: Diagnosis not present

## 2020-02-22 DIAGNOSIS — M1711 Unilateral primary osteoarthritis, right knee: Secondary | ICD-10-CM | POA: Diagnosis not present

## 2020-02-22 DIAGNOSIS — I1310 Hypertensive heart and chronic kidney disease without heart failure, with stage 1 through stage 4 chronic kidney disease, or unspecified chronic kidney disease: Secondary | ICD-10-CM | POA: Diagnosis not present

## 2020-02-24 DIAGNOSIS — M1711 Unilateral primary osteoarthritis, right knee: Secondary | ICD-10-CM | POA: Diagnosis not present

## 2020-02-24 DIAGNOSIS — I69398 Other sequelae of cerebral infarction: Secondary | ICD-10-CM | POA: Diagnosis not present

## 2020-02-24 DIAGNOSIS — I1310 Hypertensive heart and chronic kidney disease without heart failure, with stage 1 through stage 4 chronic kidney disease, or unspecified chronic kidney disease: Secondary | ICD-10-CM | POA: Diagnosis not present

## 2020-02-24 DIAGNOSIS — M21062 Valgus deformity, not elsewhere classified, left knee: Secondary | ICD-10-CM | POA: Diagnosis not present

## 2020-02-24 DIAGNOSIS — R2681 Unsteadiness on feet: Secondary | ICD-10-CM | POA: Diagnosis not present

## 2020-02-24 DIAGNOSIS — M47812 Spondylosis without myelopathy or radiculopathy, cervical region: Secondary | ICD-10-CM | POA: Diagnosis not present

## 2020-03-01 DIAGNOSIS — M21062 Valgus deformity, not elsewhere classified, left knee: Secondary | ICD-10-CM | POA: Diagnosis not present

## 2020-03-01 DIAGNOSIS — M1711 Unilateral primary osteoarthritis, right knee: Secondary | ICD-10-CM | POA: Diagnosis not present

## 2020-03-01 DIAGNOSIS — R2681 Unsteadiness on feet: Secondary | ICD-10-CM | POA: Diagnosis not present

## 2020-03-01 DIAGNOSIS — I1310 Hypertensive heart and chronic kidney disease without heart failure, with stage 1 through stage 4 chronic kidney disease, or unspecified chronic kidney disease: Secondary | ICD-10-CM | POA: Diagnosis not present

## 2020-03-01 DIAGNOSIS — M47812 Spondylosis without myelopathy or radiculopathy, cervical region: Secondary | ICD-10-CM | POA: Diagnosis not present

## 2020-03-01 DIAGNOSIS — I69398 Other sequelae of cerebral infarction: Secondary | ICD-10-CM | POA: Diagnosis not present

## 2020-03-02 DIAGNOSIS — R2681 Unsteadiness on feet: Secondary | ICD-10-CM | POA: Diagnosis not present

## 2020-03-02 DIAGNOSIS — I1310 Hypertensive heart and chronic kidney disease without heart failure, with stage 1 through stage 4 chronic kidney disease, or unspecified chronic kidney disease: Secondary | ICD-10-CM | POA: Diagnosis not present

## 2020-03-02 DIAGNOSIS — I69312 Visuospatial deficit and spatial neglect following cerebral infarction: Secondary | ICD-10-CM | POA: Diagnosis not present

## 2020-03-02 DIAGNOSIS — Z9181 History of falling: Secondary | ICD-10-CM | POA: Diagnosis not present

## 2020-03-02 DIAGNOSIS — I69398 Other sequelae of cerebral infarction: Secondary | ICD-10-CM | POA: Diagnosis not present

## 2020-03-02 DIAGNOSIS — M1711 Unilateral primary osteoarthritis, right knee: Secondary | ICD-10-CM | POA: Diagnosis not present

## 2020-03-02 DIAGNOSIS — N1831 Chronic kidney disease, stage 3a: Secondary | ICD-10-CM | POA: Diagnosis not present

## 2020-03-02 DIAGNOSIS — M47812 Spondylosis without myelopathy or radiculopathy, cervical region: Secondary | ICD-10-CM | POA: Diagnosis not present

## 2020-03-02 DIAGNOSIS — I083 Combined rheumatic disorders of mitral, aortic and tricuspid valves: Secondary | ICD-10-CM | POA: Diagnosis not present

## 2020-03-02 DIAGNOSIS — E785 Hyperlipidemia, unspecified: Secondary | ICD-10-CM | POA: Diagnosis not present

## 2020-03-02 DIAGNOSIS — D631 Anemia in chronic kidney disease: Secondary | ICD-10-CM | POA: Diagnosis not present

## 2020-03-02 DIAGNOSIS — H401133 Primary open-angle glaucoma, bilateral, severe stage: Secondary | ICD-10-CM | POA: Diagnosis not present

## 2020-03-02 DIAGNOSIS — Z7982 Long term (current) use of aspirin: Secondary | ICD-10-CM | POA: Diagnosis not present

## 2020-03-02 DIAGNOSIS — M21062 Valgus deformity, not elsewhere classified, left knee: Secondary | ICD-10-CM | POA: Diagnosis not present

## 2020-03-02 DIAGNOSIS — E042 Nontoxic multinodular goiter: Secondary | ICD-10-CM | POA: Diagnosis not present

## 2020-03-02 DIAGNOSIS — H547 Unspecified visual loss: Secondary | ICD-10-CM | POA: Diagnosis not present

## 2020-03-02 DIAGNOSIS — Z85038 Personal history of other malignant neoplasm of large intestine: Secondary | ICD-10-CM | POA: Diagnosis not present

## 2020-03-08 DIAGNOSIS — M47812 Spondylosis without myelopathy or radiculopathy, cervical region: Secondary | ICD-10-CM | POA: Diagnosis not present

## 2020-03-08 DIAGNOSIS — M21062 Valgus deformity, not elsewhere classified, left knee: Secondary | ICD-10-CM | POA: Diagnosis not present

## 2020-03-08 DIAGNOSIS — R2681 Unsteadiness on feet: Secondary | ICD-10-CM | POA: Diagnosis not present

## 2020-03-08 DIAGNOSIS — I1310 Hypertensive heart and chronic kidney disease without heart failure, with stage 1 through stage 4 chronic kidney disease, or unspecified chronic kidney disease: Secondary | ICD-10-CM | POA: Diagnosis not present

## 2020-03-08 DIAGNOSIS — I69398 Other sequelae of cerebral infarction: Secondary | ICD-10-CM | POA: Diagnosis not present

## 2020-03-08 DIAGNOSIS — M1711 Unilateral primary osteoarthritis, right knee: Secondary | ICD-10-CM | POA: Diagnosis not present

## 2020-03-23 DIAGNOSIS — H401133 Primary open-angle glaucoma, bilateral, severe stage: Secondary | ICD-10-CM | POA: Diagnosis not present

## 2020-04-20 DIAGNOSIS — H401133 Primary open-angle glaucoma, bilateral, severe stage: Secondary | ICD-10-CM | POA: Diagnosis not present

## 2020-04-25 ENCOUNTER — Ambulatory Visit: Payer: BLUE CROSS/BLUE SHIELD | Admitting: Adult Health

## 2020-04-27 DIAGNOSIS — G629 Polyneuropathy, unspecified: Secondary | ICD-10-CM | POA: Diagnosis not present

## 2020-04-27 DIAGNOSIS — I6381 Other cerebral infarction due to occlusion or stenosis of small artery: Secondary | ICD-10-CM | POA: Diagnosis not present

## 2020-04-27 DIAGNOSIS — Z1339 Encounter for screening examination for other mental health and behavioral disorders: Secondary | ICD-10-CM | POA: Diagnosis not present

## 2020-04-27 DIAGNOSIS — N1832 Chronic kidney disease, stage 3b: Secondary | ICD-10-CM | POA: Diagnosis not present

## 2020-04-27 DIAGNOSIS — E538 Deficiency of other specified B group vitamins: Secondary | ICD-10-CM | POA: Diagnosis not present

## 2020-04-27 DIAGNOSIS — D638 Anemia in other chronic diseases classified elsewhere: Secondary | ICD-10-CM | POA: Diagnosis not present

## 2020-04-27 DIAGNOSIS — R2689 Other abnormalities of gait and mobility: Secondary | ICD-10-CM | POA: Diagnosis not present

## 2020-04-27 DIAGNOSIS — Z1331 Encounter for screening for depression: Secondary | ICD-10-CM | POA: Diagnosis not present

## 2020-04-27 DIAGNOSIS — E042 Nontoxic multinodular goiter: Secondary | ICD-10-CM | POA: Diagnosis not present

## 2020-04-27 DIAGNOSIS — I10 Essential (primary) hypertension: Secondary | ICD-10-CM | POA: Diagnosis not present

## 2020-04-27 DIAGNOSIS — E7800 Pure hypercholesterolemia, unspecified: Secondary | ICD-10-CM | POA: Diagnosis not present

## 2020-04-27 DIAGNOSIS — E559 Vitamin D deficiency, unspecified: Secondary | ICD-10-CM | POA: Diagnosis not present

## 2020-05-17 ENCOUNTER — Ambulatory Visit: Payer: Medicare Other | Admitting: Physical Medicine & Rehabilitation

## 2020-06-26 DIAGNOSIS — Z23 Encounter for immunization: Secondary | ICD-10-CM | POA: Diagnosis not present

## 2020-07-26 DIAGNOSIS — H401133 Primary open-angle glaucoma, bilateral, severe stage: Secondary | ICD-10-CM | POA: Diagnosis not present

## 2020-08-01 ENCOUNTER — Other Ambulatory Visit: Payer: Self-pay

## 2020-08-01 ENCOUNTER — Emergency Department (HOSPITAL_COMMUNITY): Payer: Medicare Other

## 2020-08-01 ENCOUNTER — Encounter (HOSPITAL_COMMUNITY): Payer: Self-pay | Admitting: Emergency Medicine

## 2020-08-01 ENCOUNTER — Emergency Department (HOSPITAL_COMMUNITY)
Admission: EM | Admit: 2020-08-01 | Discharge: 2020-08-03 | Disposition: A | Discharge status: Home / Self Care | Payer: Medicare Other | Attending: Emergency Medicine | Admitting: Emergency Medicine

## 2020-08-01 DIAGNOSIS — Z20822 Contact with and (suspected) exposure to covid-19: Secondary | ICD-10-CM | POA: Diagnosis not present

## 2020-08-01 DIAGNOSIS — Z9181 History of falling: Secondary | ICD-10-CM | POA: Diagnosis not present

## 2020-08-01 DIAGNOSIS — S8992XA Unspecified injury of left lower leg, initial encounter: Secondary | ICD-10-CM | POA: Diagnosis present

## 2020-08-01 DIAGNOSIS — R2689 Other abnormalities of gait and mobility: Secondary | ICD-10-CM | POA: Diagnosis not present

## 2020-08-01 DIAGNOSIS — I672 Cerebral atherosclerosis: Secondary | ICD-10-CM | POA: Diagnosis not present

## 2020-08-01 DIAGNOSIS — M25562 Pain in left knee: Secondary | ICD-10-CM | POA: Diagnosis not present

## 2020-08-01 DIAGNOSIS — W1830XA Fall on same level, unspecified, initial encounter: Secondary | ICD-10-CM | POA: Diagnosis not present

## 2020-08-01 DIAGNOSIS — R001 Bradycardia, unspecified: Secondary | ICD-10-CM | POA: Diagnosis not present

## 2020-08-01 DIAGNOSIS — I6381 Other cerebral infarction due to occlusion or stenosis of small artery: Secondary | ICD-10-CM | POA: Diagnosis not present

## 2020-08-01 DIAGNOSIS — M25462 Effusion, left knee: Secondary | ICD-10-CM | POA: Diagnosis not present

## 2020-08-01 DIAGNOSIS — N1831 Chronic kidney disease, stage 3a: Secondary | ICD-10-CM | POA: Insufficient documentation

## 2020-08-01 DIAGNOSIS — W19XXXA Unspecified fall, initial encounter: Secondary | ICD-10-CM

## 2020-08-01 DIAGNOSIS — S83105A Unspecified dislocation of left knee, initial encounter: Secondary | ICD-10-CM | POA: Diagnosis not present

## 2020-08-01 DIAGNOSIS — R531 Weakness: Secondary | ICD-10-CM | POA: Insufficient documentation

## 2020-08-01 DIAGNOSIS — I129 Hypertensive chronic kidney disease with stage 1 through stage 4 chronic kidney disease, or unspecified chronic kidney disease: Secondary | ICD-10-CM | POA: Diagnosis not present

## 2020-08-01 DIAGNOSIS — S83012A Lateral subluxation of left patella, initial encounter: Secondary | ICD-10-CM | POA: Diagnosis not present

## 2020-08-01 DIAGNOSIS — Y92000 Kitchen of unspecified non-institutional (private) residence as  the place of occurrence of the external cause: Secondary | ICD-10-CM | POA: Insufficient documentation

## 2020-08-01 DIAGNOSIS — Z85038 Personal history of other malignant neoplasm of large intestine: Secondary | ICD-10-CM | POA: Diagnosis not present

## 2020-08-01 DIAGNOSIS — Z79899 Other long term (current) drug therapy: Secondary | ICD-10-CM | POA: Diagnosis not present

## 2020-08-01 DIAGNOSIS — I639 Cerebral infarction, unspecified: Secondary | ICD-10-CM | POA: Insufficient documentation

## 2020-08-01 DIAGNOSIS — R29898 Other symptoms and signs involving the musculoskeletal system: Secondary | ICD-10-CM | POA: Diagnosis not present

## 2020-08-01 LAB — COMPREHENSIVE METABOLIC PANEL
ALT: 13 U/L (ref 0–44)
AST: 14 U/L — ABNORMAL LOW (ref 15–41)
Albumin: 3.9 g/dL (ref 3.5–5.0)
Alkaline Phosphatase: 63 U/L (ref 38–126)
Anion gap: 12 (ref 5–15)
BUN: 19 mg/dL (ref 8–23)
CO2: 24 mmol/L (ref 22–32)
Calcium: 10 mg/dL (ref 8.9–10.3)
Chloride: 100 mmol/L (ref 98–111)
Creatinine, Ser: 1.09 mg/dL — ABNORMAL HIGH (ref 0.44–1.00)
GFR, Estimated: 47 mL/min — ABNORMAL LOW (ref >60)
Glucose, Bld: 100 mg/dL — ABNORMAL HIGH (ref 70–99)
Potassium: 3.9 mmol/L (ref 3.5–5.1)
Sodium: 136 mmol/L (ref 135–145)
Total Bilirubin: 0.5 mg/dL (ref 0.3–1.2)
Total Protein: 7.7 g/dL (ref 6.5–8.1)

## 2020-08-01 LAB — URINALYSIS, ROUTINE W REFLEX MICROSCOPIC
Bilirubin Urine: NEGATIVE
Glucose, UA: NEGATIVE mg/dL
Ketones, ur: NEGATIVE mg/dL
Leukocytes,Ua: NEGATIVE
Nitrite: POSITIVE — AB
Protein, ur: 30 mg/dL — AB
Specific Gravity, Urine: 1.008 (ref 1.005–1.030)
pH: 7 (ref 5.0–8.0)

## 2020-08-01 LAB — CBC
HCT: 38.9 % (ref 36.0–46.0)
Hemoglobin: 12.5 g/dL (ref 12.0–15.0)
MCH: 30.4 pg (ref 26.0–34.0)
MCHC: 32.1 g/dL (ref 30.0–36.0)
MCV: 94.6 fL (ref 80.0–100.0)
Platelets: 286 10*3/uL (ref 150–400)
RBC: 4.11 MIL/uL (ref 3.87–5.11)
RDW: 13 % (ref 11.5–15.5)
WBC: 6.3 10*3/uL (ref 4.0–10.5)
nRBC: 0 % (ref 0.0–0.2)

## 2020-08-01 LAB — DIFFERENTIAL
Abs Immature Granulocytes: 0.02 10*3/uL (ref 0.00–0.07)
Basophils Absolute: 0 10*3/uL (ref 0.0–0.1)
Basophils Relative: 1 %
Eosinophils Absolute: 0.2 10*3/uL (ref 0.0–0.5)
Eosinophils Relative: 2 %
Immature Granulocytes: 0 %
Lymphocytes Relative: 14 %
Lymphs Abs: 0.9 10*3/uL (ref 0.7–4.0)
Monocytes Absolute: 0.4 10*3/uL (ref 0.1–1.0)
Monocytes Relative: 7 %
Neutro Abs: 4.8 10*3/uL (ref 1.7–7.7)
Neutrophils Relative %: 76 %

## 2020-08-01 LAB — PROTIME-INR
INR: 1 (ref 0.8–1.2)
Prothrombin Time: 13.6 seconds (ref 11.4–15.2)

## 2020-08-01 LAB — APTT: aPTT: 28 seconds (ref 24–36)

## 2020-08-01 MED ORDER — SODIUM CHLORIDE 0.9% FLUSH
3.0000 mL | Freq: Once | INTRAVENOUS | Status: AC
Start: 2020-08-01 — End: 2020-08-01
  Administered 2020-08-01: 14:00:00 3 mL via INTRAVENOUS

## 2020-08-01 NOTE — NC FL2 (Signed)
[place] MEDICAID FL2 LEVEL OF CARE SCREENING TOOL     IDENTIFICATION  Patient Name: Cindy Smith Birthdate: 1923/10/03 Sex: female Admission Date (Current Location): 08/01/2020  [facility] and [place] Number:  Herbalist and Address:  [facility] [place] [place] [address], [place], [place] [address]      Provider Number: [id number]  Attending Physician Name and Address:  Blanchie Dessert, MD  Relative Name and Phone Number:  Riley Churches Daughter   Nicholson    Current Level of Care: Hospital Recommended Level of Care: [place] Prior Approval Number:    Date Approved/Denied:   PASRR Number: [id number]  Discharge Plan: SNF    Current Diagnoses: Patient Active Problem List   Diagnosis Date Noted   Osteoarthritis of left knee 12/08/2019   Stage 3a chronic kidney disease ([place])    Anemia of chronic disease    History of colon cancer    Essential hypertension    Muscle spasm    Infarction of right basal ganglia ([place]) 11/19/2019   Pressure injury of skin 11/19/2019   AKI (acute kidney injury) ([place]) 11/17/2019   Hyponatremia 11/17/2019   Normocytic anemia 11/17/2019   Hypertensive urgency 11/17/2019   Stroke-like symptoms 11/17/2019   Ischemic stroke ([place]) 11/17/2019    Orientation RESPIRATION BLADDER Height & Weight     Self, Time, Situation, Place  Normal Incontinent Weight:   Height:     BEHAVIORAL SYMPTOMS/MOOD NEUROLOGICAL BOWEL NUTRITION STATUS      Continent Diet (Regular)  AMBULATORY STATUS COMMUNICATION OF NEEDS Skin   Extensive Assist Verbally Skin abrasions (Skin tear on left knee from attempting to move around after fall)                       Personal Care Assistance Level of Assistance  Bathing, Feeding, Dressing Bathing Assistance: Limited assistance Feeding assistance: Independent Dressing Assistance: Limited assistance     Functional Limitations Info  Sight, Hearing, Speech  Sight Info: Impaired (Uses eye drops. Also has bad peripheral vision) Hearing Info: Impaired (Difficulty hearing since stroke.) Speech Info: Adequate    SPECIAL CARE FACTORS FREQUENCY  PT (By licensed PT), OT (By licensed OT)     PT Frequency: 5x weekly OT Frequency: 5x weekly            Contractures Contractures Info: Not present    Additional Factors Info  Code Status, Allergies Code Status Info: Full Allergies Info: (7)Aspirin,Codeine,Lactose Intolerance ,Sulfamethoxazole-trimethoprim,Atorvastatin,Lotemax ,Prednisone           Current Medications (08/01/2020):  This is the current hospital active medication list No current facility-administered medications for this encounter.   Current Outpatient Medications  Medication Sig Dispense Refill   amLODipine (NORVASC) 10 MG tablet Take 1 tablet (10 mg total) by mouth daily. 30 tablet 0   atenolol (TENORMIN) 25 MG tablet Take 1 tablet (25 mg total) by mouth daily. 30 tablet 0   brimonidine (ALPHAGAN) 0.2 % ophthalmic solution Place 1 drop into both eyes 3 (three) times daily.     Cholecalciferol (VITAMIN D-3) 25 MCG (1000 UT) CAPS Take 1,000-2,000 Units by mouth daily.     cyanocobalamin 1000 MCG tablet Take 1 tablet (1,000 mcg total) by mouth daily. 30 tablet 0   dorzolamide-timolol (COSOPT) 22.3-6.8 MG/ML ophthalmic solution Place 1 drop into both eyes 2 (two) times daily.     ROCKLATAN 0.02-0.005 % SOLN Place 1 drop into both eyes at bedtime.  saccharomyces boulardii (FLORASTOR) 250 MG capsule Take 1 capsule (250 mg total) by mouth 2 (two) times daily. (Patient taking differently: Take 250 mg by mouth daily.) 60 capsule 0     Discharge Medications: Please see discharge summary for a list of discharge medications.  Relevant Imaging Results:  Relevant Lab Results:   Additional Information SSN3 [id number] Pt,  Pt has had 2 J&J Covid shots and 1 Pfizer booster  Catering manager, LCSW

## 2020-08-01 NOTE — ED Notes (Signed)
Pt left knee is swollen. She denies hitting her leg falling. She stated she collapsed due to weakness in her legs. She was working with the PT in January to help with the strength in her legs.

## 2020-08-01 NOTE — ED Notes (Signed)
Patient transported to X-ray 

## 2020-08-01 NOTE — ED Provider Notes (Signed)
[facility] EMERGENCY DEPARTMENT Provider Note   CSN: [id number] Arrival date & time: 08/01/20  1045     History Chief Complaint  Patient presents with   Weakness    Cindy Smith is a 87 y.o. female with a past medical history of hypertension, CKD, osteoarthritis of left knee, prior stroke presenting to the ED with a chief complaint of weakness.  On 07/27/2020, patient was ambulating in her kitchen with her walker at baseline.  States that she has "sub flooring" in her kitchen that makes it difficult for her to ambulate. She slowly fell to the ground 3x that day due to "my legs just have been feeling weak since I started that Lipitor."  She stopped taking the Lipitor several weeks ago but was told that the leg weakness and myalgias may last long-term.  She states that over the weekend she ambulated less and stated "in the safe zones in my house that my daughter set up for me."  She also use a bedside commode for convenience.  States that she feels at baseline right now but saw her PCP this morning and was told to come to the ER to make sure she did not have another stroke.  She denies any headache, worsening vision changes, vomiting, changes to appetite, chest pain, shortness of breath, fever, urinary symptoms, abdominal pain, loss of consciousness, head injury, anticoagulant use.  She admits that she stopped taking her baby aspirin several weeks ago as well.  The history is provided by the patient and a relative. No language interpreter was used.      Past Medical History:  Diagnosis Date   HTN (hypertension)    Hyperthyroidism     Patient Active Problem List   Diagnosis Date Noted   Osteoarthritis of left knee 12/08/2019   Stage 3a chronic kidney disease ([place])    Anemia of chronic disease    History of colon cancer    Essential hypertension    Muscle spasm    Infarction of right basal ganglia ([place]) 11/19/2019   Pressure injury of skin 11/19/2019   AKI  (acute kidney injury) ([place]) 11/17/2019   Hyponatremia 11/17/2019   Normocytic anemia 11/17/2019   Hypertensive urgency 11/17/2019   Stroke-like symptoms 11/17/2019   Ischemic stroke ([place]) 11/17/2019    Past Surgical History:  Procedure Laterality Date   COLECTOMY       OB History   No obstetric history on file.     No family history on file.  Social History   Tobacco Use   Smoking status: Never   Smokeless tobacco: Never  Vaping Use   Vaping Use: Never used  Substance Use Topics   Alcohol use: Never   Drug use: Never    Home Medications Prior to Admission medications   Medication Sig Start Date End Date Taking? Authorizing Provider  amLODipine (NORVASC) 10 MG tablet Take 1 tablet (10 mg total) by mouth daily. 12/01/19 08/01/20 Yes Love, Ivan Anchors, PA-C  atenolol (TENORMIN) 25 MG tablet Take 1 tablet (25 mg total) by mouth daily. 12/01/19  Yes Love, Ivan Anchors, PA-C  brimonidine (ALPHAGAN) 0.2 % ophthalmic solution Place 1 drop into both eyes 3 (three) times daily.   Yes [provider]  Cholecalciferol (VITAMIN D-3) 25 MCG (1000 UT) CAPS Take 1,000-2,000 Units by mouth daily.   Yes [provider]  cyanocobalamin 1000 MCG tablet Take 1 tablet (1,000 mcg total) by mouth daily. 12/01/19  Yes Love, Ivan Anchors, PA-C  dorzolamide-timolol (COSOPT) 22.3-6.8 MG/ML ophthalmic solution Place 1 drop into both eyes 2 (two) times daily.   Yes [provider]  ROCKLATAN 0.02-0.005 % SOLN Place 1 drop into both eyes at bedtime.   Yes [provider]  saccharomyces boulardii (FLORASTOR) 250 MG capsule Take 1 capsule (250 mg total) by mouth 2 (two) times daily. Patient taking differently: Take 250 mg by mouth daily. 12/01/19  Yes Love, Ivan Anchors, PA-C    Allergies    Asa [aspirin], Codeine, Lactose intolerance (gi), Lipitor [atorvastatin], Lotemax [loteprednol], Prednisone, and Sulfamethoxazole-trimethoprim  Review of Systems   Review of Systems   Constitutional:  Negative for appetite change, chills and fever.  HENT:  Negative for ear pain, rhinorrhea, sneezing and sore throat.   Eyes:  Negative for photophobia and visual disturbance.  Respiratory:  Negative for cough, chest tightness, shortness of breath and wheezing.   Cardiovascular:  Negative for chest pain and palpitations.  Gastrointestinal:  Negative for abdominal pain, blood in stool, constipation, diarrhea, nausea and vomiting.  Genitourinary:  Negative for dysuria, hematuria and urgency.  Musculoskeletal:  Positive for myalgias.  Skin:  Negative for rash.  Neurological:  Negative for dizziness, weakness and light-headedness.   Physical Exam Updated Vital Signs BP (!) 157/75   Pulse (!) 58   Temp (!) 97.5 F (36.4 C) (Oral)   Resp 17   SpO2 97%   Physical Exam Vitals and nursing note reviewed.  Constitutional:      General: She is not in acute distress.    Appearance: She is well-developed.  HENT:     Head: Normocephalic and atraumatic.     Nose: Nose normal.  Eyes:     General: No scleral icterus.       Right eye: No discharge.        Left eye: No discharge.     Conjunctiva/sclera: Conjunctivae normal.  Cardiovascular:     Rate and Rhythm: Normal rate and regular rhythm.     Heart sounds: Normal heart sounds. No murmur heard.   No friction rub. No gallop.  Pulmonary:     Effort: Pulmonary effort is normal. No respiratory distress.     Breath sounds: Normal breath sounds.  Abdominal:     General: Bowel sounds are normal. There is no distension.     Palpations: Abdomen is soft.     Tenderness: There is no abdominal tenderness. There is no guarding.  Musculoskeletal:        General: Tenderness present. Normal range of motion.     Cervical back: Normal range of motion and neck supple.     Right lower leg: Edema present.     Left lower leg: Edema present.     Comments: L knee tenderness with deformity of knee noted. 2+ DP  pulse noted bilaterally.   Normal sensation to light touch of bilateral lower extremities.  Skin:    General: Skin is warm and dry.     Findings: No rash.  Neurological:     Mental Status: She is alert and oriented to person, place, and time.     Cranial Nerves: No cranial nerve deficit.     Sensory: No sensory deficit.     Motor: No weakness or abnormal muscle tone.     Coordination: Coordination normal.     Comments: Pupils reactive. No facial asymmetry noted. Cranial nerves appear grossly intact. Sensation intact to light touch on face, BUE and BLE. Strength 5/5 in BUE and BLE. Moving extremities without  difficulty.    ED Results / Procedures / Treatments   Labs (all labs ordered are listed, but only abnormal results are displayed) Labs Reviewed  COMPREHENSIVE METABOLIC PANEL - Abnormal; Notable for the following components:      Result Value   Glucose, Bld 100 (*)    Creatinine, Ser 1.09 (*)    AST 14 (*)    GFR, Estimated 47 (*)    All other components within normal limits  URINALYSIS, ROUTINE W REFLEX MICROSCOPIC - Abnormal; Notable for the following components:   APPearance HAZY (*)    Hgb urine dipstick SMALL (*)    Protein, ur 30 (*)    Nitrite POSITIVE (*)    Bacteria, UA RARE (*)    All other components within normal limits  URINE CULTURE  PROTIME-INR  APTT  CBC  DIFFERENTIAL    EKG EKG Interpretation  Date/Time:  Tuesday August 01 2020 10:54:28 EDT Ventricular Rate:  54 PR Interval:  210 QRS Duration: 92 QT Interval:  434 QTC Calculation: 411 R Axis:   -26 Text Interpretation: Sinus bradycardia with sinus arrhythmia with 1st degree A-V block Septal infarct , age undetermined No significant change since last tracing Confirmed by Blanchie Dessert [phone]) on 08/01/2020 11:07:22 AM  Radiology DG Knee 1-2 Views Left  Result Date: 08/01/2020 CLINICAL DATA:  Recent fall with knee pain, initial encounter EXAM: LEFT KNEE - 2 VIEW COMPARISON:  None. FINDINGS: There are changes consistent  with lateral and anterior dislocation of the tibia with respect to the distal femur. Changes of prior fibular fracture are noted. No joint effusion is seen. IMPRESSION: Changes consistent with knee dislocation with lateral and anterior displacement of the tibia with respect to the distal femur. Electronically Signed   By: Inez Catalina M.D.   On: 08/01/2020 16:46   CT HEAD WO CONTRAST  Result Date: 08/01/2020 CLINICAL DATA:  Leg weakness EXAM: CT HEAD WITHOUT CONTRAST TECHNIQUE: Contiguous axial images were obtained from the base of the skull through the vertex without intravenous contrast. COMPARISON:  11/17/2019 FINDINGS: Brain: No sign of acute infarction. Chronic small-vessel ischemic changes affect the pons. No focal cerebellar insult. Cerebral hemispheres show pronounced chronic small-vessel ischemic changes throughout the deep and subcortical white matter. No mass, hemorrhage, hydrocephalus or extra-axial collection. Vascular: There is atherosclerotic calcification of the major vessels at the base of the brain. Skull: Negative Sinuses/Orbits: Clear/normal Other: None IMPRESSION: No acute finding. Age related atrophy and chronic small-vessel ischemic changes, particularly affecting the cerebral hemispheric white matter. Electronically Signed   By: Nelson Chimes M.D.   On: 08/01/2020 12:31   MR BRAIN WO CONTRAST  Result Date: 08/01/2020 CLINICAL DATA:  TIA, right-sided weakness EXAM: MRI HEAD WITHOUT CONTRAST TECHNIQUE: Multiplanar, multiecho pulse sequences of the brain and surrounding structures were obtained without intravenous contrast. COMPARISON:  11/17/2019 FINDINGS: Brain: There is no acute infarction or intracranial hemorrhage. There is no intracranial mass, mass effect, or edema. There is no hydrocephalus or extra-axial fluid collection. Prominence of the ventricles and sulci reflects mild generalized parenchymal volume loss. Patchy and confluent areas of T2 hyperintensity in the supratentorial  greater than pontine white matter are nonspecific but probably reflect moderate to advanced chronic microvascular ischemic changes. Few foci of chronic microhemorrhage again identified. Vascular: Major vessel flow voids at the skull base are preserved. Skull and upper cervical spine: Normal marrow signal is preserved. Sinuses/Orbits: Paranasal sinuses are aerated. Bilateral lens replacements. Other: Sella is unremarkable. Minor patchy mastoid fluid opacification. IMPRESSION: No  evidence of recent infarction, hemorrhage, or mass. Moderate to advanced chronic microvascular ischemic changes. Electronically Signed   By: Macy Mis M.D.   On: 08/01/2020 15:09    Procedures Procedures   Medications Ordered in ED Medications  sodium chloride flush (NS) 0.9 % injection 3 mL (3 mLs Intravenous Given 08/01/20 1348)    ED Course  I have reviewed the triage vital signs and the nursing notes.  Pertinent labs & imaging results that were available during my care of the patient were reviewed by me and considered in my medical decision making (see chart for details).  Clinical Course as of 08/01/20 1851  Tue Aug 01, 2020  1203 WBC: 6.3 [HK]  1237 Creatinine(!): 1.09 At baseline. [HK]  1237 Hemoglobin: 12.5 [HK]  1237 CT HEAD WO CONTRAST No acute findings, shows age related changes. [HK]  1403 Nitrite(!): POSITIVE [HK]  1275 Leukocytes,Ua: NEGATIVE [HK]  1443 WBC, UA: 0-5 [HK]  1443 Bacteria, UA(!): RARE [HK]    Clinical Course User Index [HK] Delia Heady, PA-C   MDM Rules/Calculators/A&P                          87 year old female with past medical history of hypertension, CKD, osteoarthritis of left knee, prior stroke presenting to the ED for chief complaint of weakness.  On 07/27/2020 patient was ambulating in her kitchen with her walker at baseline.  She has some uneven flooring in her kitchen that makes it difficult for her to ambulate.  States that she felt like her knees buckled from under  her and since then has been trouble ambulating.  She is able to pivot over the past few days but feels that her ambulation is not at her baseline.  She has had issues with this left knee in the past but "had I never had an x-ray done and they never told me what was wrong with it, they just put me in a brace which was so uncomfortable to wear."  She denies directly falling on her knee.  Denies any headache, vision changes, vomiting, numbness, urinary symptoms or fever.  On exam patient without any neurological deficits.  She has no numbness or weakness on exam.  She is moving extremities without difficulty although there is a left knee deformity.  She has equal and intact distal pulses bilaterally.  She has normal sensation to light touch.  She is able to lift her leg off of the bed but has pain with movement of her left knee.  CMP, CBC is unremarkable.  Urinalysis with nitrites but no other evidence of UTI.  Urine was sent for culture.  CT of the head without any acute findings and MRI without any acute findings or concerns for hemorrhage, mass or infarction.  X-ray done of knee which showed lateral anterior displacement of the tibia in comparison to the femur.  I spoke to Dr. Stann Mainland, on-call orthopedist who reviewed these x-ray imaging.  He is concerned that this is most likely chronic in nature as the mechanism does not fit that this would be acute.  He does recommend CT of the knee to evaluate for chronicity based on changes of the bone.  Based on orthopedic standpoint no indication for intervention at this time.  I will consult transition of care team as daughter would like outpatient rehab placement but is able to take care of her at home currently.  We will try to arrange for a wheelchair as well.  Care handed off to oncoming team pending recheck after CT results and TOC team recommendations.   Portions of this note were generated with [employer]. Dictation errors may occur despite best  attempts at proofreading.  Final Clinical Impression(s) / ED Diagnoses Final diagnoses:  Dislocation of left knee, initial encounter    Rx / DC Orders ED Discharge Orders     None        Delia Heady, PA-C 08/01/20 1852    Carmin Muskrat, MD 08/01/20 2352

## 2020-08-01 NOTE — ED Notes (Signed)
Patient placed on purewick at this time. 

## 2020-08-01 NOTE — Discharge Instructions (Addendum)
You have been seen and discharged from the emergency department.  Use wheelchair and walker as needed. Follow-up with your primary provider for reevaluation and further care. Take home medications as prescribed. If you have any worsening symptoms or further concerns for your health please return to an emergency department for further evaluation.

## 2020-08-01 NOTE — ED Triage Notes (Signed)
Pt fell on Thursday and EMS came to her house but she wasn't transported.  Family reports generalized weakness.  Seen by PCP today and sent to ED to R/o Stroke.  Hx of stroke.  Pt reports R arm weakness/numbness that she states is related to a pinched nerve.  No arm drift.  Speech clear.

## 2020-08-01 NOTE — ED Notes (Signed)
Pt returned from MRI °

## 2020-08-01 NOTE — ED Notes (Signed)
Pt requested something to drink. Informed we are waiting on ortho-surgeon consult.

## 2020-08-02 DIAGNOSIS — S83105A Unspecified dislocation of left knee, initial encounter: Secondary | ICD-10-CM | POA: Diagnosis not present

## 2020-08-02 LAB — SARS CORONAVIRUS 2 (TAT 6-24 HRS): SARS Coronavirus 2: NEGATIVE

## 2020-08-02 MED ORDER — VITAMIN D 25 MCG (1000 UNIT) PO TABS
1000.0000 [IU] | ORAL_TABLET | Freq: Every day | ORAL | Status: DC
  Administered 2020-08-02: 1000 [IU] via ORAL
  Filled 2020-08-02: qty 1

## 2020-08-02 MED ORDER — NETARSUDIL-LATANOPROST 0.02-0.005 % OP SOLN: 1.0000 [drp] | Freq: Every day | OPHTHALMIC | Status: DC

## 2020-08-02 MED ORDER — ATENOLOL 25 MG PO TABS
25.0000 mg | ORAL_TABLET | Freq: Every day | ORAL | Status: DC
  Administered 2020-08-02 – 2020-08-03 (×2): 25 mg via ORAL
  Filled 2020-08-02 (×2): qty 1

## 2020-08-02 MED ORDER — BRIMONIDINE TARTRATE 0.2 % OP SOLN
1.0000 [drp] | Freq: Three times a day (TID) | OPHTHALMIC | Status: DC
  Administered 2020-08-02: 1 [drp] via OPHTHALMIC
  Filled 2020-08-02: qty 5

## 2020-08-02 MED ORDER — AMLODIPINE BESYLATE 5 MG PO TABS
10.0000 mg | ORAL_TABLET | Freq: Every day | ORAL | Status: DC
  Administered 2020-08-02 – 2020-08-03 (×2): 10 mg via ORAL
  Filled 2020-08-02 (×2): qty 2

## 2020-08-02 MED ORDER — VITAMIN B-12 1000 MCG PO TABS
1000.0000 ug | ORAL_TABLET | Freq: Every day | ORAL | Status: DC
  Administered 2020-08-02 – 2020-08-03 (×2): 1000 ug via ORAL
  Filled 2020-08-02 (×2): qty 1

## 2020-08-02 MED ORDER — SACCHAROMYCES BOULARDII 250 MG PO CAPS
250.0000 mg | ORAL_CAPSULE | Freq: Every day | ORAL | Status: DC
  Administered 2020-08-02 – 2020-08-03 (×2): 250 mg via ORAL
  Filled 2020-08-02 (×3): qty 1

## 2020-08-02 MED ORDER — DORZOLAMIDE HCL-TIMOLOL MAL 2-0.5 % OP SOLN
1.0000 [drp] | Freq: Two times a day (BID) | OPHTHALMIC | Status: DC
  Administered 2020-08-02 – 2020-08-03 (×3): 1 [drp] via OPHTHALMIC
  Filled 2020-08-02: qty 10

## 2020-08-02 NOTE — Progress Notes (Signed)
PT Cancellation Note  Patient Details Name: Cindy Smith MRN: [id number] DOB: [birth year]   Cancelled Treatment:    Reason Eval/Treat Not Completed: Medical issues which prohibited therapy Currently awaiting ortho recommedations prior to initiation of therapies. Will follow up as pt appropriate and as schedule allows.   Reuel Derby, PT, DPT  Acute Rehabilitation Services  Pager: [phone] Office: [phone]    Rudean Hitt 08/02/2020, 9:04 AM

## 2020-08-02 NOTE — Evaluation (Signed)
Physical Therapy Evaluation Patient Details Name: Cindy Smith MRN: [id number] DOB: [birth year] Today's Date: 08/02/2020   History of Present Illness  Pt is a 87 y/o female presenting to the ED 6/28 secondary to inability to ambulate. Had a fall a few days prior to admission and has had increased difficulty since. Imaging showed fracture through the  osteophyte along the medial aspect of the medial tibial plateau and Anterior and lateral subluxation of the tibia and fibula in  relation to the femur. Per ortho, seems to be chronic. PMH includes HTN and CVA.  Clinical Impression  Pt presenting secondary to problem above with deficits below. Increased pain and instability in L knee noted throughout. Required max A +2 to stand and pivot to [facility]. Posterior lean noted as well. Feel pt would benefit from SNF level therapies to increase independence and safety. Prior to falling, pt was mod I level using walker. Will continue to follow acutely to maximize functional mobility independence and safety.     Follow Up Recommendations SNF    Equipment Recommendations  Wheelchair cushion (measurements PT);Wheelchair (measurements PT);Hospital bed    Recommendations for Other Services       Precautions / Restrictions Precautions Precautions: Fall Restrictions Weight Bearing Restrictions: Yes LLE Weight Bearing: Weight bearing as tolerated Other Position/Activity Restrictions: Per MD, WBAT      Mobility  Bed Mobility Overal bed mobility: Needs Assistance Bed Mobility: Supine to Sit;Sit to Supine     Supine to sit: Min assist Sit to supine: Mod assist;+2 for physical assistance   General bed mobility comments: Required assist for trunk to come to sitting on stretcher. Assist for trunk and LEs to return to supine on stretcher safely.    Transfers Overall transfer level: Needs assistance Equipment used: 2 person hand held assist Transfers: Stand Pivot Transfers;Sit to/from Stand Sit to  Stand: Max assist;+2 physical assistance Stand pivot transfers: Max assist;+2 physical assistance       General transfer comment: Max A +2 to stand and pivot to/from [facility]. Difficulty bearing weight on LLE and difficulty taking steps. Posterior lean noted as well  Ambulation/Gait                Stairs            Wheelchair Mobility    Modified Rankin (Stroke Patients Only)       Balance Overall balance assessment: Needs assistance Sitting-balance support: No upper extremity supported;Feet supported Sitting balance-Leahy Scale: Fair     Standing balance support: Bilateral upper extremity supported;During functional activity Standing balance-Leahy Scale: Poor Standing balance comment: Reliant on UE and external support                             Pertinent Vitals/Pain Pain Assessment: Faces Faces Pain Scale: Hurts even more Pain Location: L knee Pain Descriptors / Indicators: Burning;Guarding;Grimacing Pain Intervention(s): Limited activity within patient's tolerance;Monitored during session;Repositioned    Home Living Family/patient expects to be discharged to:: Skilled nursing facility Living Arrangements: Alone                    Prior Function Level of Independence: Independent with assistive device(s)         Comments: was independent with rollator     Hand Dominance        Extremity/Trunk Assessment   Upper Extremity Assessment Upper Extremity Assessment: Generalized weakness    Lower Extremity Assessment Lower Extremity Assessment: LLE  deficits/detail LLE Deficits / Details: L knee swollen and valgus posturing. Per pt this is baseline    Cervical / Trunk Assessment Cervical / Trunk Assessment: Normal  Communication   Communication: No difficulties  Cognition Arousal/Alertness: Awake/alert Behavior During Therapy: WFL for tasks assessed/performed Overall Cognitive Status: Within Functional Limits for tasks  assessed                                        General Comments      Exercises     Assessment/Plan    PT Assessment Patient needs continued PT services  PT Problem List Decreased strength;Decreased range of motion;Decreased activity tolerance;Decreased balance;Decreased mobility;Decreased knowledge of use of DME;Decreased knowledge of precautions       PT Treatment Interventions DME instruction;Gait training;Therapeutic activities;Functional mobility training;Therapeutic exercise;Balance training;Patient/family education;Wheelchair mobility training    PT Goals (Current goals can be found in the Care Plan section)  Acute Rehab PT Goals Patient Stated Goal: to go to SNF to get stronger PT Goal Formulation: With patient/family Time For Goal Achievement: 08/16/20 Potential to Achieve Goals: Fair    Frequency Min 2X/week   Barriers to discharge        Co-evaluation               AM-PAC PT "6 Clicks" Mobility  Outcome Measure Help needed turning from your back to your side while in a flat bed without using bedrails?: A Little Help needed moving from lying on your back to sitting on the side of a flat bed without using bedrails?: A Little Help needed moving to and from a bed to a chair (including a wheelchair)?: Total Help needed standing up from a chair using your arms (e.g., wheelchair or bedside chair)?: Total Help needed to walk in hospital room?: Total Help needed climbing 3-5 steps with a railing? : Total 6 Click Score: 10    End of Session Equipment Utilized During Treatment: Gait belt Activity Tolerance: Patient tolerated treatment well Patient left: in bed;with call bell/phone within reach;with family/visitor present (on stretcher in ED) Nurse Communication: Mobility status PT Visit Diagnosis: Unsteadiness on feet (R26.81);Muscle weakness (generalized) (M62.81);Difficulty in walking, not elsewhere classified (R26.2)    Time: 1235-1310 PT  Time Calculation (min) (ACUTE ONLY): 35 min   Charges:   PT Evaluation $PT Eval Moderate Complexity: 1 Mod PT Treatments $Therapeutic Activity: 8-22 mins        Lou Miner, DPT  Acute Rehabilitation Services  Pager: [phone] Office: [phone]   Rudean Hitt 08/02/2020, 1:35 PM

## 2020-08-02 NOTE — Progress Notes (Signed)
No beds at [facility] after chosen by Pt. Pt then chose [facility] and CSW called to begin placement was told someone would call back.  CSW called again with no return call again.  With Pt/family permission CSW widened SNF search.

## 2020-08-03 DIAGNOSIS — Z20822 Contact with and (suspected) exposure to covid-19: Secondary | ICD-10-CM | POA: Diagnosis not present

## 2020-08-03 DIAGNOSIS — W19XXXD Unspecified fall, subsequent encounter: Secondary | ICD-10-CM | POA: Diagnosis not present

## 2020-08-03 DIAGNOSIS — I10 Essential (primary) hypertension: Secondary | ICD-10-CM | POA: Diagnosis not present

## 2020-08-03 DIAGNOSIS — R531 Weakness: Secondary | ICD-10-CM | POA: Diagnosis not present

## 2020-08-03 DIAGNOSIS — Z8673 Personal history of transient ischemic attack (TIA), and cerebral infarction without residual deficits: Secondary | ICD-10-CM | POA: Diagnosis not present

## 2020-08-03 DIAGNOSIS — D638 Anemia in other chronic diseases classified elsewhere: Secondary | ICD-10-CM | POA: Diagnosis not present

## 2020-08-03 DIAGNOSIS — H04123 Dry eye syndrome of bilateral lacrimal glands: Secondary | ICD-10-CM | POA: Diagnosis not present

## 2020-08-03 DIAGNOSIS — Z9181 History of falling: Secondary | ICD-10-CM | POA: Diagnosis not present

## 2020-08-03 DIAGNOSIS — R5383 Other fatigue: Secondary | ICD-10-CM | POA: Diagnosis not present

## 2020-08-03 DIAGNOSIS — Z79899 Other long term (current) drug therapy: Secondary | ICD-10-CM | POA: Diagnosis not present

## 2020-08-03 DIAGNOSIS — M6281 Muscle weakness (generalized): Secondary | ICD-10-CM | POA: Diagnosis not present

## 2020-08-03 DIAGNOSIS — S83105D Unspecified dislocation of left knee, subsequent encounter: Secondary | ICD-10-CM | POA: Diagnosis not present

## 2020-08-03 DIAGNOSIS — Z743 Need for continuous supervision: Secondary | ICD-10-CM | POA: Diagnosis not present

## 2020-08-03 DIAGNOSIS — R5381 Other malaise: Secondary | ICD-10-CM | POA: Diagnosis not present

## 2020-08-03 DIAGNOSIS — L89322 Pressure ulcer of left buttock, stage 2: Secondary | ICD-10-CM | POA: Diagnosis not present

## 2020-08-03 DIAGNOSIS — M1732 Unilateral post-traumatic osteoarthritis, left knee: Secondary | ICD-10-CM | POA: Diagnosis not present

## 2020-08-03 DIAGNOSIS — M62838 Other muscle spasm: Secondary | ICD-10-CM | POA: Diagnosis not present

## 2020-08-03 DIAGNOSIS — R279 Unspecified lack of coordination: Secondary | ICD-10-CM | POA: Diagnosis not present

## 2020-08-03 DIAGNOSIS — I129 Hypertensive chronic kidney disease with stage 1 through stage 4 chronic kidney disease, or unspecified chronic kidney disease: Secondary | ICD-10-CM | POA: Diagnosis not present

## 2020-08-03 DIAGNOSIS — R41841 Cognitive communication deficit: Secondary | ICD-10-CM | POA: Diagnosis not present

## 2020-08-03 DIAGNOSIS — K5900 Constipation, unspecified: Secondary | ICD-10-CM | POA: Diagnosis not present

## 2020-08-03 DIAGNOSIS — Z85038 Personal history of other malignant neoplasm of large intestine: Secondary | ICD-10-CM | POA: Diagnosis not present

## 2020-08-03 DIAGNOSIS — M25562 Pain in left knee: Secondary | ICD-10-CM | POA: Diagnosis not present

## 2020-08-03 DIAGNOSIS — R2681 Unsteadiness on feet: Secondary | ICD-10-CM | POA: Diagnosis not present

## 2020-08-03 DIAGNOSIS — G9009 Other idiopathic peripheral autonomic neuropathy: Secondary | ICD-10-CM | POA: Diagnosis not present

## 2020-08-03 DIAGNOSIS — N1831 Chronic kidney disease, stage 3a: Secondary | ICD-10-CM | POA: Diagnosis not present

## 2020-08-03 DIAGNOSIS — S83105A Unspecified dislocation of left knee, initial encounter: Secondary | ICD-10-CM | POA: Diagnosis not present

## 2020-08-03 LAB — URINE CULTURE: Culture: >=100000 — AB

## 2020-08-03 MED ORDER — BRIMONIDINE TARTRATE 0.2 % OP SOLN
1.0000 [drp] | Freq: Three times a day (TID) | OPHTHALMIC | Status: DC
  Administered 2020-08-03: 1 [drp] via OPHTHALMIC

## 2020-08-03 MED ORDER — NETARSUDIL-LATANOPROST 0.02-0.005 % OP SOLN: 1.0000 [drp] | Freq: Every day | OPHTHALMIC | Status: DC

## 2020-08-03 MED ORDER — BRIMONIDINE TARTRATE 0.2 % OP SOLN
1.0000 [drp] | Freq: Once | OPHTHALMIC | Status: AC
  Administered 2020-08-03: 1 [drp] via OPHTHALMIC

## 2020-08-03 NOTE — ED Notes (Signed)
Called [facility] to assess timeframe. Pt was not on their list. Pt added to [facility]'s transport list at this time, and is #23 in cue.

## 2020-08-03 NOTE — ED Provider Notes (Signed)
Transition of care team has been working with the patient and they have secured SNF placement, transportation is ready, I did her AVS.  Patient stable at time of discharge.   Lorelle Gibbs, DO 08/03/20 1339

## 2020-08-03 NOTE — Progress Notes (Signed)
Voicemail left with [facility] Admissions.

## 2020-08-03 NOTE — Progress Notes (Signed)
SNF Placement update Below    [facility]- No beds until next Wednesday  [facility]- Unable to take patient until this weekend or Monday due to surveyors being in the building. CSW discussed with daughter  [facility]- Message left with admissions   [facility]- Currently reviewing patient for placement   [facility]- Message left with admissions   [facility]- Message left with admissions   [facility]- No beds at this time  [facility] left with admissions

## 2020-08-03 NOTE — Progress Notes (Signed)
Patient is going to [facility] for SNF rehab. Patient will be going to room 201. RN notified to call report and [facility].

## 2020-08-05 ENCOUNTER — Telehealth: Payer: Self-pay | Admitting: Emergency Medicine

## 2020-08-05 NOTE — Telephone Encounter (Signed)
Post ED Visit - Positive Culture Follow-up  Culture report reviewed by antimicrobial stewardship pharmacist: [place] Team []  Elenor Quinones, Pharm.D. []  Heide Guile, Pharm.D., BCPS AQ-ID []  Parks Neptune, Pharm.D., BCPS []  Alycia Rossetti, Pharm.D., BCPS []  [place], Pharm.D., BCPS, AAHIVP []  Legrand Como, Pharm.D., BCPS, AAHIVP []  Salome Arnt, PharmD, BCPS []  Johnnette Gourd, PharmD, BCPS []  Hughes Better, PharmD, BCPS [x]  Joetta Manners, PharmD []  Laqueta Linden, PharmD, BCPS []  Albertina Parr, PharmD  [place] Team []  Leodis Sias, PharmD []  Lindell Spar, PharmD []  Royetta Asal, PharmD []  Graylin Shiver, Rph []  Rema Fendt) Glennon Mac, PharmD []  Arlyn Dunning, PharmD []  Netta Cedars, PharmD []  Dia Sitter, PharmD []  Leone Haven, PharmD []  Gretta Arab, PharmD []  Theodis Shove, PharmD []  Peggyann Juba, PharmD []  Reuel Boom, PharmD   Positive urine culture No further patient follow-up is required at this time.  Sandi Raveling Latroy Gaymon 08/05/2020, 4:31 PM

## 2020-08-09 DIAGNOSIS — L89322 Pressure ulcer of left buttock, stage 2: Secondary | ICD-10-CM | POA: Diagnosis not present

## 2020-08-09 DIAGNOSIS — R2681 Unsteadiness on feet: Secondary | ICD-10-CM | POA: Diagnosis not present

## 2020-08-09 DIAGNOSIS — G9009 Other idiopathic peripheral autonomic neuropathy: Secondary | ICD-10-CM | POA: Diagnosis not present

## 2020-08-09 DIAGNOSIS — I10 Essential (primary) hypertension: Secondary | ICD-10-CM | POA: Diagnosis not present

## 2020-08-09 DIAGNOSIS — K5900 Constipation, unspecified: Secondary | ICD-10-CM | POA: Diagnosis not present

## 2020-08-09 DIAGNOSIS — S83105A Unspecified dislocation of left knee, initial encounter: Secondary | ICD-10-CM | POA: Diagnosis not present

## 2020-08-16 DIAGNOSIS — L89322 Pressure ulcer of left buttock, stage 2: Secondary | ICD-10-CM | POA: Diagnosis not present

## 2020-08-23 ENCOUNTER — Other Ambulatory Visit: Payer: Self-pay | Admitting: *Deleted

## 2020-08-23 DIAGNOSIS — H04123 Dry eye syndrome of bilateral lacrimal glands: Secondary | ICD-10-CM | POA: Diagnosis not present

## 2020-08-23 DIAGNOSIS — Z9181 History of falling: Secondary | ICD-10-CM | POA: Diagnosis not present

## 2020-08-23 DIAGNOSIS — M810 Age-related osteoporosis without current pathological fracture: Secondary | ICD-10-CM | POA: Diagnosis not present

## 2020-08-23 DIAGNOSIS — E871 Hypo-osmolality and hyponatremia: Secondary | ICD-10-CM | POA: Diagnosis not present

## 2020-08-23 DIAGNOSIS — E538 Deficiency of other specified B group vitamins: Secondary | ICD-10-CM | POA: Diagnosis not present

## 2020-08-23 DIAGNOSIS — Z8673 Personal history of transient ischemic attack (TIA), and cerebral infarction without residual deficits: Secondary | ICD-10-CM | POA: Diagnosis not present

## 2020-08-23 DIAGNOSIS — I440 Atrioventricular block, first degree: Secondary | ICD-10-CM | POA: Diagnosis not present

## 2020-08-23 DIAGNOSIS — S83105D Unspecified dislocation of left knee, subsequent encounter: Secondary | ICD-10-CM | POA: Diagnosis not present

## 2020-08-23 DIAGNOSIS — M1732 Unilateral post-traumatic osteoarthritis, left knee: Secondary | ICD-10-CM | POA: Diagnosis not present

## 2020-08-23 DIAGNOSIS — K5900 Constipation, unspecified: Secondary | ICD-10-CM | POA: Diagnosis not present

## 2020-08-23 DIAGNOSIS — M21961 Unspecified acquired deformity of right lower leg: Secondary | ICD-10-CM | POA: Diagnosis not present

## 2020-08-23 DIAGNOSIS — Z9221 Personal history of antineoplastic chemotherapy: Secondary | ICD-10-CM | POA: Diagnosis not present

## 2020-08-23 DIAGNOSIS — I129 Hypertensive chronic kidney disease with stage 1 through stage 4 chronic kidney disease, or unspecified chronic kidney disease: Secondary | ICD-10-CM | POA: Diagnosis not present

## 2020-08-23 DIAGNOSIS — E785 Hyperlipidemia, unspecified: Secondary | ICD-10-CM | POA: Diagnosis not present

## 2020-08-23 DIAGNOSIS — H547 Unspecified visual loss: Secondary | ICD-10-CM | POA: Diagnosis not present

## 2020-08-23 DIAGNOSIS — M25462 Effusion, left knee: Secondary | ICD-10-CM | POA: Diagnosis not present

## 2020-08-23 DIAGNOSIS — Z923 Personal history of irradiation: Secondary | ICD-10-CM | POA: Diagnosis not present

## 2020-08-23 DIAGNOSIS — N1831 Chronic kidney disease, stage 3a: Secondary | ICD-10-CM | POA: Diagnosis not present

## 2020-08-23 DIAGNOSIS — N179 Acute kidney failure, unspecified: Secondary | ICD-10-CM | POA: Diagnosis not present

## 2020-08-23 DIAGNOSIS — W19XXXD Unspecified fall, subsequent encounter: Secondary | ICD-10-CM | POA: Diagnosis not present

## 2020-08-23 DIAGNOSIS — Z85038 Personal history of other malignant neoplasm of large intestine: Secondary | ICD-10-CM | POA: Diagnosis not present

## 2020-08-23 DIAGNOSIS — D631 Anemia in chronic kidney disease: Secondary | ICD-10-CM | POA: Diagnosis not present

## 2020-08-23 DIAGNOSIS — G629 Polyneuropathy, unspecified: Secondary | ICD-10-CM | POA: Diagnosis not present

## 2020-08-23 DIAGNOSIS — H409 Unspecified glaucoma: Secondary | ICD-10-CM | POA: Diagnosis not present

## 2020-08-23 NOTE — Patient Outreach (Signed)
Member screened for potential [facility] Care Management needs. Verified in [facility] that [name] transitioned to home from [facility] on 08/22/20.  Telephone call made to daughter/DPR Cindy Smith [phone]. Patient identifiers confirmed. Cindy Smith reports [name] came home yesterday and [facility] came to see her today. She will have PT/OT and aide services with [facility]. States family will stay with [name] thru next Tuesday 24/7 and they will evaluate at that time if additional caregiver services are needed.   Cindy Smith states they are reactivating [name]'s medical alert system today. States she has spoke with the PCP's nurse about medication reconciliation post SNF. States she was informed PCP follow up appointment was not needed since member recently had PCP appointment on 07/30/20.  Cindy Smith reports member does not have any [facility] Care Management needs at this time. Expressed appreciation of the call.  Writer will email [facility] and writer's contact information for future reference.    Cindy Rolling, MSN, RN,BSN [place] Acute Care Coordinator [phone] [facility]) [phone]  (Toll free office)

## 2020-08-25 DIAGNOSIS — N1831 Chronic kidney disease, stage 3a: Secondary | ICD-10-CM | POA: Diagnosis not present

## 2020-08-25 DIAGNOSIS — S83105D Unspecified dislocation of left knee, subsequent encounter: Secondary | ICD-10-CM | POA: Diagnosis not present

## 2020-08-25 DIAGNOSIS — M25462 Effusion, left knee: Secondary | ICD-10-CM | POA: Diagnosis not present

## 2020-08-25 DIAGNOSIS — I129 Hypertensive chronic kidney disease with stage 1 through stage 4 chronic kidney disease, or unspecified chronic kidney disease: Secondary | ICD-10-CM | POA: Diagnosis not present

## 2020-08-25 DIAGNOSIS — D631 Anemia in chronic kidney disease: Secondary | ICD-10-CM | POA: Diagnosis not present

## 2020-08-25 DIAGNOSIS — M1732 Unilateral post-traumatic osteoarthritis, left knee: Secondary | ICD-10-CM | POA: Diagnosis not present

## 2020-08-29 DIAGNOSIS — M1732 Unilateral post-traumatic osteoarthritis, left knee: Secondary | ICD-10-CM | POA: Diagnosis not present

## 2020-08-29 DIAGNOSIS — M25462 Effusion, left knee: Secondary | ICD-10-CM | POA: Diagnosis not present

## 2020-08-29 DIAGNOSIS — D631 Anemia in chronic kidney disease: Secondary | ICD-10-CM | POA: Diagnosis not present

## 2020-08-29 DIAGNOSIS — N1831 Chronic kidney disease, stage 3a: Secondary | ICD-10-CM | POA: Diagnosis not present

## 2020-08-29 DIAGNOSIS — S83105D Unspecified dislocation of left knee, subsequent encounter: Secondary | ICD-10-CM | POA: Diagnosis not present

## 2020-08-29 DIAGNOSIS — I129 Hypertensive chronic kidney disease with stage 1 through stage 4 chronic kidney disease, or unspecified chronic kidney disease: Secondary | ICD-10-CM | POA: Diagnosis not present

## 2020-08-31 DIAGNOSIS — N1831 Chronic kidney disease, stage 3a: Secondary | ICD-10-CM | POA: Diagnosis not present

## 2020-08-31 DIAGNOSIS — M25462 Effusion, left knee: Secondary | ICD-10-CM | POA: Diagnosis not present

## 2020-08-31 DIAGNOSIS — I129 Hypertensive chronic kidney disease with stage 1 through stage 4 chronic kidney disease, or unspecified chronic kidney disease: Secondary | ICD-10-CM | POA: Diagnosis not present

## 2020-08-31 DIAGNOSIS — D631 Anemia in chronic kidney disease: Secondary | ICD-10-CM | POA: Diagnosis not present

## 2020-08-31 DIAGNOSIS — S83105D Unspecified dislocation of left knee, subsequent encounter: Secondary | ICD-10-CM | POA: Diagnosis not present

## 2020-08-31 DIAGNOSIS — M1732 Unilateral post-traumatic osteoarthritis, left knee: Secondary | ICD-10-CM | POA: Diagnosis not present

## 2020-09-01 DIAGNOSIS — M25462 Effusion, left knee: Secondary | ICD-10-CM | POA: Diagnosis not present

## 2020-09-01 DIAGNOSIS — D631 Anemia in chronic kidney disease: Secondary | ICD-10-CM | POA: Diagnosis not present

## 2020-09-01 DIAGNOSIS — S83105D Unspecified dislocation of left knee, subsequent encounter: Secondary | ICD-10-CM | POA: Diagnosis not present

## 2020-09-01 DIAGNOSIS — N1831 Chronic kidney disease, stage 3a: Secondary | ICD-10-CM | POA: Diagnosis not present

## 2020-09-01 DIAGNOSIS — I129 Hypertensive chronic kidney disease with stage 1 through stage 4 chronic kidney disease, or unspecified chronic kidney disease: Secondary | ICD-10-CM | POA: Diagnosis not present

## 2020-09-01 DIAGNOSIS — M1732 Unilateral post-traumatic osteoarthritis, left knee: Secondary | ICD-10-CM | POA: Diagnosis not present

## 2020-09-06 DIAGNOSIS — I129 Hypertensive chronic kidney disease with stage 1 through stage 4 chronic kidney disease, or unspecified chronic kidney disease: Secondary | ICD-10-CM | POA: Diagnosis not present

## 2020-09-06 DIAGNOSIS — N1831 Chronic kidney disease, stage 3a: Secondary | ICD-10-CM | POA: Diagnosis not present

## 2020-09-06 DIAGNOSIS — S83105D Unspecified dislocation of left knee, subsequent encounter: Secondary | ICD-10-CM | POA: Diagnosis not present

## 2020-09-06 DIAGNOSIS — M1732 Unilateral post-traumatic osteoarthritis, left knee: Secondary | ICD-10-CM | POA: Diagnosis not present

## 2020-09-06 DIAGNOSIS — D631 Anemia in chronic kidney disease: Secondary | ICD-10-CM | POA: Diagnosis not present

## 2020-09-06 DIAGNOSIS — M25462 Effusion, left knee: Secondary | ICD-10-CM | POA: Diagnosis not present

## 2020-09-12 DIAGNOSIS — N1831 Chronic kidney disease, stage 3a: Secondary | ICD-10-CM | POA: Diagnosis not present

## 2020-09-12 DIAGNOSIS — I129 Hypertensive chronic kidney disease with stage 1 through stage 4 chronic kidney disease, or unspecified chronic kidney disease: Secondary | ICD-10-CM | POA: Diagnosis not present

## 2020-09-12 DIAGNOSIS — M25462 Effusion, left knee: Secondary | ICD-10-CM | POA: Diagnosis not present

## 2020-09-12 DIAGNOSIS — M1732 Unilateral post-traumatic osteoarthritis, left knee: Secondary | ICD-10-CM | POA: Diagnosis not present

## 2020-09-12 DIAGNOSIS — S83105D Unspecified dislocation of left knee, subsequent encounter: Secondary | ICD-10-CM | POA: Diagnosis not present

## 2020-09-12 DIAGNOSIS — D631 Anemia in chronic kidney disease: Secondary | ICD-10-CM | POA: Diagnosis not present

## 2020-09-13 DIAGNOSIS — M1732 Unilateral post-traumatic osteoarthritis, left knee: Secondary | ICD-10-CM | POA: Diagnosis not present

## 2020-09-13 DIAGNOSIS — S83105D Unspecified dislocation of left knee, subsequent encounter: Secondary | ICD-10-CM | POA: Diagnosis not present

## 2020-09-13 DIAGNOSIS — I129 Hypertensive chronic kidney disease with stage 1 through stage 4 chronic kidney disease, or unspecified chronic kidney disease: Secondary | ICD-10-CM | POA: Diagnosis not present

## 2020-09-13 DIAGNOSIS — M25462 Effusion, left knee: Secondary | ICD-10-CM | POA: Diagnosis not present

## 2020-09-13 DIAGNOSIS — D631 Anemia in chronic kidney disease: Secondary | ICD-10-CM | POA: Diagnosis not present

## 2020-09-13 DIAGNOSIS — N1831 Chronic kidney disease, stage 3a: Secondary | ICD-10-CM | POA: Diagnosis not present

## 2020-09-14 DIAGNOSIS — M25462 Effusion, left knee: Secondary | ICD-10-CM | POA: Diagnosis not present

## 2020-09-14 DIAGNOSIS — N1831 Chronic kidney disease, stage 3a: Secondary | ICD-10-CM | POA: Diagnosis not present

## 2020-09-14 DIAGNOSIS — D631 Anemia in chronic kidney disease: Secondary | ICD-10-CM | POA: Diagnosis not present

## 2020-09-14 DIAGNOSIS — I129 Hypertensive chronic kidney disease with stage 1 through stage 4 chronic kidney disease, or unspecified chronic kidney disease: Secondary | ICD-10-CM | POA: Diagnosis not present

## 2020-09-14 DIAGNOSIS — M1732 Unilateral post-traumatic osteoarthritis, left knee: Secondary | ICD-10-CM | POA: Diagnosis not present

## 2020-09-14 DIAGNOSIS — S83105D Unspecified dislocation of left knee, subsequent encounter: Secondary | ICD-10-CM | POA: Diagnosis not present

## 2020-09-15 DIAGNOSIS — D631 Anemia in chronic kidney disease: Secondary | ICD-10-CM | POA: Diagnosis not present

## 2020-09-15 DIAGNOSIS — S83105D Unspecified dislocation of left knee, subsequent encounter: Secondary | ICD-10-CM | POA: Diagnosis not present

## 2020-09-15 DIAGNOSIS — I129 Hypertensive chronic kidney disease with stage 1 through stage 4 chronic kidney disease, or unspecified chronic kidney disease: Secondary | ICD-10-CM | POA: Diagnosis not present

## 2020-09-15 DIAGNOSIS — M25462 Effusion, left knee: Secondary | ICD-10-CM | POA: Diagnosis not present

## 2020-09-15 DIAGNOSIS — M1732 Unilateral post-traumatic osteoarthritis, left knee: Secondary | ICD-10-CM | POA: Diagnosis not present

## 2020-09-15 DIAGNOSIS — N1831 Chronic kidney disease, stage 3a: Secondary | ICD-10-CM | POA: Diagnosis not present

## 2020-09-19 DIAGNOSIS — I129 Hypertensive chronic kidney disease with stage 1 through stage 4 chronic kidney disease, or unspecified chronic kidney disease: Secondary | ICD-10-CM | POA: Diagnosis not present

## 2020-09-19 DIAGNOSIS — N1831 Chronic kidney disease, stage 3a: Secondary | ICD-10-CM | POA: Diagnosis not present

## 2020-09-19 DIAGNOSIS — M25462 Effusion, left knee: Secondary | ICD-10-CM | POA: Diagnosis not present

## 2020-09-19 DIAGNOSIS — D631 Anemia in chronic kidney disease: Secondary | ICD-10-CM | POA: Diagnosis not present

## 2020-09-19 DIAGNOSIS — S83105D Unspecified dislocation of left knee, subsequent encounter: Secondary | ICD-10-CM | POA: Diagnosis not present

## 2020-09-19 DIAGNOSIS — M1732 Unilateral post-traumatic osteoarthritis, left knee: Secondary | ICD-10-CM | POA: Diagnosis not present

## 2020-09-20 DIAGNOSIS — M25462 Effusion, left knee: Secondary | ICD-10-CM | POA: Diagnosis not present

## 2020-09-20 DIAGNOSIS — M1732 Unilateral post-traumatic osteoarthritis, left knee: Secondary | ICD-10-CM | POA: Diagnosis not present

## 2020-09-20 DIAGNOSIS — D631 Anemia in chronic kidney disease: Secondary | ICD-10-CM | POA: Diagnosis not present

## 2020-09-20 DIAGNOSIS — N1831 Chronic kidney disease, stage 3a: Secondary | ICD-10-CM | POA: Diagnosis not present

## 2020-09-20 DIAGNOSIS — S83105D Unspecified dislocation of left knee, subsequent encounter: Secondary | ICD-10-CM | POA: Diagnosis not present

## 2020-09-20 DIAGNOSIS — I129 Hypertensive chronic kidney disease with stage 1 through stage 4 chronic kidney disease, or unspecified chronic kidney disease: Secondary | ICD-10-CM | POA: Diagnosis not present

## 2020-09-21 DIAGNOSIS — N1831 Chronic kidney disease, stage 3a: Secondary | ICD-10-CM | POA: Diagnosis not present

## 2020-09-21 DIAGNOSIS — M25462 Effusion, left knee: Secondary | ICD-10-CM | POA: Diagnosis not present

## 2020-09-21 DIAGNOSIS — S83105D Unspecified dislocation of left knee, subsequent encounter: Secondary | ICD-10-CM | POA: Diagnosis not present

## 2020-09-21 DIAGNOSIS — D631 Anemia in chronic kidney disease: Secondary | ICD-10-CM | POA: Diagnosis not present

## 2020-09-21 DIAGNOSIS — M1732 Unilateral post-traumatic osteoarthritis, left knee: Secondary | ICD-10-CM | POA: Diagnosis not present

## 2020-09-21 DIAGNOSIS — I129 Hypertensive chronic kidney disease with stage 1 through stage 4 chronic kidney disease, or unspecified chronic kidney disease: Secondary | ICD-10-CM | POA: Diagnosis not present

## 2020-09-22 DIAGNOSIS — G629 Polyneuropathy, unspecified: Secondary | ICD-10-CM | POA: Diagnosis not present

## 2020-09-22 DIAGNOSIS — W19XXXD Unspecified fall, subsequent encounter: Secondary | ICD-10-CM | POA: Diagnosis not present

## 2020-09-22 DIAGNOSIS — H04123 Dry eye syndrome of bilateral lacrimal glands: Secondary | ICD-10-CM | POA: Diagnosis not present

## 2020-09-22 DIAGNOSIS — D631 Anemia in chronic kidney disease: Secondary | ICD-10-CM | POA: Diagnosis not present

## 2020-09-22 DIAGNOSIS — H547 Unspecified visual loss: Secondary | ICD-10-CM | POA: Diagnosis not present

## 2020-09-22 DIAGNOSIS — M21961 Unspecified acquired deformity of right lower leg: Secondary | ICD-10-CM | POA: Diagnosis not present

## 2020-09-22 DIAGNOSIS — E871 Hypo-osmolality and hyponatremia: Secondary | ICD-10-CM | POA: Diagnosis not present

## 2020-09-22 DIAGNOSIS — Z9181 History of falling: Secondary | ICD-10-CM | POA: Diagnosis not present

## 2020-09-22 DIAGNOSIS — Z9221 Personal history of antineoplastic chemotherapy: Secondary | ICD-10-CM | POA: Diagnosis not present

## 2020-09-22 DIAGNOSIS — N1831 Chronic kidney disease, stage 3a: Secondary | ICD-10-CM | POA: Diagnosis not present

## 2020-09-22 DIAGNOSIS — I440 Atrioventricular block, first degree: Secondary | ICD-10-CM | POA: Diagnosis not present

## 2020-09-22 DIAGNOSIS — E538 Deficiency of other specified B group vitamins: Secondary | ICD-10-CM | POA: Diagnosis not present

## 2020-09-22 DIAGNOSIS — N179 Acute kidney failure, unspecified: Secondary | ICD-10-CM | POA: Diagnosis not present

## 2020-09-22 DIAGNOSIS — Z923 Personal history of irradiation: Secondary | ICD-10-CM | POA: Diagnosis not present

## 2020-09-22 DIAGNOSIS — E785 Hyperlipidemia, unspecified: Secondary | ICD-10-CM | POA: Diagnosis not present

## 2020-09-22 DIAGNOSIS — M810 Age-related osteoporosis without current pathological fracture: Secondary | ICD-10-CM | POA: Diagnosis not present

## 2020-09-22 DIAGNOSIS — Z8673 Personal history of transient ischemic attack (TIA), and cerebral infarction without residual deficits: Secondary | ICD-10-CM | POA: Diagnosis not present

## 2020-09-22 DIAGNOSIS — H409 Unspecified glaucoma: Secondary | ICD-10-CM | POA: Diagnosis not present

## 2020-09-22 DIAGNOSIS — Z85038 Personal history of other malignant neoplasm of large intestine: Secondary | ICD-10-CM | POA: Diagnosis not present

## 2020-09-22 DIAGNOSIS — M1732 Unilateral post-traumatic osteoarthritis, left knee: Secondary | ICD-10-CM | POA: Diagnosis not present

## 2020-09-22 DIAGNOSIS — M25462 Effusion, left knee: Secondary | ICD-10-CM | POA: Diagnosis not present

## 2020-09-22 DIAGNOSIS — S83105D Unspecified dislocation of left knee, subsequent encounter: Secondary | ICD-10-CM | POA: Diagnosis not present

## 2020-09-22 DIAGNOSIS — K5900 Constipation, unspecified: Secondary | ICD-10-CM | POA: Diagnosis not present

## 2020-09-22 DIAGNOSIS — I129 Hypertensive chronic kidney disease with stage 1 through stage 4 chronic kidney disease, or unspecified chronic kidney disease: Secondary | ICD-10-CM | POA: Diagnosis not present

## 2020-09-26 DIAGNOSIS — M25462 Effusion, left knee: Secondary | ICD-10-CM | POA: Diagnosis not present

## 2020-09-26 DIAGNOSIS — M1732 Unilateral post-traumatic osteoarthritis, left knee: Secondary | ICD-10-CM | POA: Diagnosis not present

## 2020-09-26 DIAGNOSIS — S83105D Unspecified dislocation of left knee, subsequent encounter: Secondary | ICD-10-CM | POA: Diagnosis not present

## 2020-09-26 DIAGNOSIS — N1831 Chronic kidney disease, stage 3a: Secondary | ICD-10-CM | POA: Diagnosis not present

## 2020-09-26 DIAGNOSIS — D631 Anemia in chronic kidney disease: Secondary | ICD-10-CM | POA: Diagnosis not present

## 2020-09-26 DIAGNOSIS — I129 Hypertensive chronic kidney disease with stage 1 through stage 4 chronic kidney disease, or unspecified chronic kidney disease: Secondary | ICD-10-CM | POA: Diagnosis not present

## 2020-09-28 DIAGNOSIS — N1831 Chronic kidney disease, stage 3a: Secondary | ICD-10-CM | POA: Diagnosis not present

## 2020-09-28 DIAGNOSIS — M25462 Effusion, left knee: Secondary | ICD-10-CM | POA: Diagnosis not present

## 2020-09-28 DIAGNOSIS — I129 Hypertensive chronic kidney disease with stage 1 through stage 4 chronic kidney disease, or unspecified chronic kidney disease: Secondary | ICD-10-CM | POA: Diagnosis not present

## 2020-09-28 DIAGNOSIS — M1732 Unilateral post-traumatic osteoarthritis, left knee: Secondary | ICD-10-CM | POA: Diagnosis not present

## 2020-09-28 DIAGNOSIS — D631 Anemia in chronic kidney disease: Secondary | ICD-10-CM | POA: Diagnosis not present

## 2020-09-28 DIAGNOSIS — S83105D Unspecified dislocation of left knee, subsequent encounter: Secondary | ICD-10-CM | POA: Diagnosis not present

## 2020-10-03 DIAGNOSIS — I129 Hypertensive chronic kidney disease with stage 1 through stage 4 chronic kidney disease, or unspecified chronic kidney disease: Secondary | ICD-10-CM | POA: Diagnosis not present

## 2020-10-03 DIAGNOSIS — D631 Anemia in chronic kidney disease: Secondary | ICD-10-CM | POA: Diagnosis not present

## 2020-10-03 DIAGNOSIS — M1732 Unilateral post-traumatic osteoarthritis, left knee: Secondary | ICD-10-CM | POA: Diagnosis not present

## 2020-10-03 DIAGNOSIS — S83105D Unspecified dislocation of left knee, subsequent encounter: Secondary | ICD-10-CM | POA: Diagnosis not present

## 2020-10-03 DIAGNOSIS — N1831 Chronic kidney disease, stage 3a: Secondary | ICD-10-CM | POA: Diagnosis not present

## 2020-10-03 DIAGNOSIS — M25462 Effusion, left knee: Secondary | ICD-10-CM | POA: Diagnosis not present

## 2020-10-11 DIAGNOSIS — M1732 Unilateral post-traumatic osteoarthritis, left knee: Secondary | ICD-10-CM | POA: Diagnosis not present

## 2020-10-11 DIAGNOSIS — S83105D Unspecified dislocation of left knee, subsequent encounter: Secondary | ICD-10-CM | POA: Diagnosis not present

## 2020-10-11 DIAGNOSIS — I129 Hypertensive chronic kidney disease with stage 1 through stage 4 chronic kidney disease, or unspecified chronic kidney disease: Secondary | ICD-10-CM | POA: Diagnosis not present

## 2020-10-11 DIAGNOSIS — D631 Anemia in chronic kidney disease: Secondary | ICD-10-CM | POA: Diagnosis not present

## 2020-10-11 DIAGNOSIS — N1831 Chronic kidney disease, stage 3a: Secondary | ICD-10-CM | POA: Diagnosis not present

## 2020-10-11 DIAGNOSIS — M25462 Effusion, left knee: Secondary | ICD-10-CM | POA: Diagnosis not present

## 2021-01-25 DIAGNOSIS — H401133 Primary open-angle glaucoma, bilateral, severe stage: Secondary | ICD-10-CM | POA: Diagnosis not present

## 2021-04-10 DIAGNOSIS — G629 Polyneuropathy, unspecified: Secondary | ICD-10-CM | POA: Diagnosis not present

## 2021-04-10 DIAGNOSIS — R2689 Other abnormalities of gait and mobility: Secondary | ICD-10-CM | POA: Diagnosis not present

## 2021-04-10 DIAGNOSIS — E559 Vitamin D deficiency, unspecified: Secondary | ICD-10-CM | POA: Diagnosis not present

## 2021-04-10 DIAGNOSIS — D638 Anemia in other chronic diseases classified elsewhere: Secondary | ICD-10-CM | POA: Diagnosis not present

## 2021-04-10 DIAGNOSIS — E538 Deficiency of other specified B group vitamins: Secondary | ICD-10-CM | POA: Diagnosis not present

## 2021-04-10 DIAGNOSIS — Z23 Encounter for immunization: Secondary | ICD-10-CM | POA: Diagnosis not present

## 2021-04-10 DIAGNOSIS — Z0000 Encounter for general adult medical examination without abnormal findings: Secondary | ICD-10-CM | POA: Diagnosis not present

## 2021-04-10 DIAGNOSIS — I10 Essential (primary) hypertension: Secondary | ICD-10-CM | POA: Diagnosis not present

## 2021-04-10 DIAGNOSIS — E7800 Pure hypercholesterolemia, unspecified: Secondary | ICD-10-CM | POA: Diagnosis not present

## 2021-04-10 DIAGNOSIS — Z1331 Encounter for screening for depression: Secondary | ICD-10-CM | POA: Diagnosis not present

## 2021-04-10 DIAGNOSIS — N1832 Chronic kidney disease, stage 3b: Secondary | ICD-10-CM | POA: Diagnosis not present

## 2021-04-10 DIAGNOSIS — E042 Nontoxic multinodular goiter: Secondary | ICD-10-CM | POA: Diagnosis not present

## 2021-04-10 DIAGNOSIS — I679 Cerebrovascular disease, unspecified: Secondary | ICD-10-CM | POA: Diagnosis not present

## 2021-09-17 ENCOUNTER — Emergency Department (HOSPITAL_COMMUNITY): Payer: Medicare Other

## 2021-09-17 ENCOUNTER — Encounter (HOSPITAL_COMMUNITY): Payer: Self-pay

## 2021-09-17 ENCOUNTER — Emergency Department (HOSPITAL_COMMUNITY)
Admission: EM | Admit: 2021-09-17 | Discharge: 2021-09-17 | Disposition: A | Discharge status: Home / Self Care | Payer: Medicare Other | Attending: Emergency Medicine | Admitting: Emergency Medicine

## 2021-09-17 ENCOUNTER — Other Ambulatory Visit: Payer: Self-pay

## 2021-09-17 DIAGNOSIS — R4182 Altered mental status, unspecified: Secondary | ICD-10-CM | POA: Diagnosis not present

## 2021-09-17 DIAGNOSIS — R5383 Other fatigue: Secondary | ICD-10-CM | POA: Diagnosis present

## 2021-09-17 DIAGNOSIS — F05 Delirium due to known physiological condition: Secondary | ICD-10-CM | POA: Insufficient documentation

## 2021-09-17 DIAGNOSIS — N390 Urinary tract infection, site not specified: Secondary | ICD-10-CM | POA: Diagnosis not present

## 2021-09-17 DIAGNOSIS — R410 Disorientation, unspecified: Secondary | ICD-10-CM

## 2021-09-17 DIAGNOSIS — I10 Essential (primary) hypertension: Secondary | ICD-10-CM | POA: Diagnosis not present

## 2021-09-17 LAB — COMPREHENSIVE METABOLIC PANEL
ALT: 11 U/L (ref 0–44)
AST: 14 U/L — ABNORMAL LOW (ref 15–41)
Albumin: 4.1 g/dL (ref 3.5–5.0)
Alkaline Phosphatase: 61 U/L (ref 38–126)
Anion gap: 7 (ref 5–15)
BUN: 29 mg/dL — ABNORMAL HIGH (ref 8–23)
CO2: 24 mmol/L (ref 22–32)
Calcium: 10.1 mg/dL (ref 8.9–10.3)
Chloride: 106 mmol/L (ref 98–111)
Creatinine, Ser: 1.05 mg/dL — ABNORMAL HIGH (ref 0.44–1.00)
GFR, Estimated: 49 mL/min — ABNORMAL LOW (ref >60)
Glucose, Bld: 111 mg/dL — ABNORMAL HIGH (ref 70–99)
Potassium: 4.2 mmol/L (ref 3.5–5.1)
Sodium: 137 mmol/L (ref 135–145)
Total Bilirubin: 0.8 mg/dL (ref 0.3–1.2)
Total Protein: 7.4 g/dL (ref 6.5–8.1)

## 2021-09-17 LAB — URINALYSIS, ROUTINE W REFLEX MICROSCOPIC
Bilirubin Urine: NEGATIVE
Glucose, UA: NEGATIVE mg/dL
Ketones, ur: NEGATIVE mg/dL
Nitrite: POSITIVE — AB
Protein, ur: 30 mg/dL — AB
Specific Gravity, Urine: 1.011 (ref 1.005–1.030)
pH: 7 (ref 5.0–8.0)

## 2021-09-17 LAB — CBC WITH DIFFERENTIAL/PLATELET
Abs Immature Granulocytes: 0.04 10*3/uL (ref 0.00–0.07)
Basophils Absolute: 0 10*3/uL (ref 0.0–0.1)
Basophils Relative: 0 %
Eosinophils Absolute: 0 10*3/uL (ref 0.0–0.5)
Eosinophils Relative: 0 %
HCT: 34.9 % — ABNORMAL LOW (ref 36.0–46.0)
Hemoglobin: 11.6 g/dL — ABNORMAL LOW (ref 12.0–15.0)
Immature Granulocytes: 0 %
Lymphocytes Relative: 12 %
Lymphs Abs: 1.2 10*3/uL (ref 0.7–4.0)
MCH: 30.9 pg (ref 26.0–34.0)
MCHC: 33.2 g/dL (ref 30.0–36.0)
MCV: 92.8 fL (ref 80.0–100.0)
Monocytes Absolute: 0.7 10*3/uL (ref 0.1–1.0)
Monocytes Relative: 7 %
Neutro Abs: 8.3 10*3/uL — ABNORMAL HIGH (ref 1.7–7.7)
Neutrophils Relative %: 81 %
Platelets: 221 10*3/uL (ref 150–400)
RBC: 3.76 MIL/uL — ABNORMAL LOW (ref 3.87–5.11)
RDW: 13.9 % (ref 11.5–15.5)
WBC: 10.3 10*3/uL (ref 4.0–10.5)
nRBC: 0 % (ref 0.0–0.2)

## 2021-09-17 MED ORDER — CEPHALEXIN 250 MG PO CAPS: 250.0000 mg | ORAL_CAPSULE | Freq: Four times a day (QID) | ORAL | 0 refills | Status: DC

## 2021-09-17 MED ORDER — CEPHALEXIN 250 MG PO CAPS
250.0000 mg | ORAL_CAPSULE | Freq: Once | ORAL | Status: AC
  Administered 2021-09-17: 250 mg via ORAL
  Filled 2021-09-17: qty 1

## 2021-09-17 NOTE — ED Notes (Signed)
PT received a ginger ale and [place] sandwich.

## 2021-09-17 NOTE — ED Triage Notes (Signed)
EMS reports from home, lives alone, Pt states she feels disoriented. Pt answered EMS questions appropriately.   BP 172/80 HR 90 RR 22 Sp02 95 RA CBG 105  18 RAC 165m NS enroute

## 2021-09-17 NOTE — Discharge Instructions (Addendum)
Testing today was done to evaluate her confusion, which at this point is fairly mild.  The urine sample indicates that she has a UTI.  She was started on cephalexin to treat that.  The testing today did not show any serious problems including sepsis, stroke, metabolic disorders or problems with blood flow.  Encourage her to get plenty of rest, drink a lot of fluids and eat a regular diet.  Call her PCP for an appointment to be seen as soon as possible.  She will need a repeat urinalysis after she finishes the antibiotic medication.  Return here, if needed for problems.

## 2021-09-17 NOTE — ED Provider Notes (Signed)
[facility] DEPT Provider Note   CSN: [id number] Arrival date & time: 09/17/21  0900     History  Chief Complaint  Patient presents with   Fatigue   Altered Mental Status    Cindy Smith is a 88 y.o. female.  HPI She presents for evaluation of confusion manifested by not remembering day and time, having trouble following directions, and less able to care for self.  She lives alone, with an aide that comes and helps during the day.  Family members with the patient, states that she has had the symptoms for several days.  Patient reports not sleeping well.  Patient's son states that she called him yesterday, after she fell while walking with her rollator.  He was able to get her off the floor, helped her to chair whereupon she ate some cereal.  After that he went home for a bit and came back later to check on her found her fast asleep in bed.  This morning the patient called her son, to report that she felt confused about what day it was.  Therefore, he brought her here for evaluation.  She is unable to specify any other symptoms.  She reports taking her medicines regularly.    Home Medications Prior to Admission medications   Medication Sig Start Date End Date Taking? Authorizing Provider  cephALEXin (KEFLEX) 250 MG capsule Take 1 capsule (250 mg total) by mouth 4 (four) times daily. 09/17/21  Yes Daleen Bo, MD  amLODipine (NORVASC) 10 MG tablet Take 1 tablet (10 mg total) by mouth daily. 12/01/19 08/01/20  Love, Ivan Anchors, PA-C  atenolol (TENORMIN) 25 MG tablet Take 1 tablet (25 mg total) by mouth daily. 12/01/19   Love, Ivan Anchors, PA-C  brimonidine (ALPHAGAN) 0.2 % ophthalmic solution Place 1 drop into both eyes 3 (three) times daily.    [provider]  Cholecalciferol (VITAMIN D-3) 25 MCG (1000 UT) CAPS Take 1,000-2,000 Units by mouth daily.    [provider]  cyanocobalamin 1000 MCG tablet Take 1 tablet (1,000 mcg total) by  mouth daily. 12/01/19   Love, Ivan Anchors, PA-C  dorzolamide-timolol (COSOPT) 22.3-6.8 MG/ML ophthalmic solution Place 1 drop into both eyes 2 (two) times daily.    [provider]  ROCKLATAN 0.02-0.005 % SOLN Place 1 drop into both eyes at bedtime.    [provider]  saccharomyces boulardii (FLORASTOR) 250 MG capsule Take 1 capsule (250 mg total) by mouth 2 (two) times daily. Patient taking differently: Take 250 mg by mouth daily. 12/01/19   Love, Ivan Anchors, PA-C      Allergies    Asa [aspirin], Codeine, Lactose intolerance (gi), Lipitor [atorvastatin], Lotemax [loteprednol], Prednisone, and Sulfamethoxazole-trimethoprim    Review of Systems   Review of Systems  Physical Exam Updated Vital Signs BP (!) 155/78   Pulse 62   Temp 98.1 F (36.7 C)   Resp 17   SpO2 92%  Physical Exam Vitals and nursing note reviewed.  Constitutional:      Appearance: She is well-developed.  HENT:     Head: Normocephalic and atraumatic.     Right Ear: External ear normal.     Left Ear: External ear normal.     Nose: Nose normal.     Mouth/Throat:     Mouth: Mucous membranes are moist.  Eyes:     Pupils: Pupils are equal, round, and reactive to light.     Comments: Bilateral conjunctivitis, which appears chronic.  Neck:  Trachea: Phonation normal.  Cardiovascular:     Rate and Rhythm: Normal rate and regular rhythm.     Heart sounds: Normal heart sounds.  Pulmonary:     Effort: Pulmonary effort is normal.     Breath sounds: Normal breath sounds.  Abdominal:     General: There is no distension.     Palpations: Abdomen is soft.  Musculoskeletal:        General: Normal range of motion.     Cervical back: Normal range of motion and neck supple.  Skin:    General: Skin is warm and dry.  Neurological:     Mental Status: She is alert.     Cranial Nerves: No cranial nerve deficit.     Sensory: No sensory deficit.     Motor: No abnormal muscle tone.     Coordination:  Coordination normal.     Comments: Alert and oriented to person and place.  She has trouble reading the clock in the exam room, reporting that it 11:45, when the clock is reading 9:45.  She is not dysarthric or showing convincing signs of expressive or receptive aphasia.  Psychiatric:        Mood and Affect: Mood normal.        Behavior: Behavior normal.        Thought Content: Thought content normal.        Judgment: Judgment normal.     ED Results / Procedures / Treatments   Labs (all labs ordered are listed, but only abnormal results are displayed) Labs Reviewed  COMPREHENSIVE METABOLIC PANEL - Abnormal; Notable for the following components:      Result Value   Glucose, Bld 111 (*)    BUN 29 (*)    Creatinine, Ser 1.05 (*)    AST 14 (*)    GFR, Estimated 49 (*)    All other components within normal limits  CBC WITH DIFFERENTIAL/PLATELET - Abnormal; Notable for the following components:   RBC 3.76 (*)    Hemoglobin 11.6 (*)    HCT 34.9 (*)    Neutro Abs 8.3 (*)    All other components within normal limits  URINALYSIS, ROUTINE W REFLEX MICROSCOPIC - Abnormal; Notable for the following components:   APPearance HAZY (*)    Hgb urine dipstick SMALL (*)    Protein, ur 30 (*)    Nitrite POSITIVE (*)    Leukocytes,Ua TRACE (*)    Bacteria, UA RARE (*)    All other components within normal limits  URINE CULTURE    EKG None  Radiology CT Head Wo Contrast  Result Date: 09/17/2021 CLINICAL DATA:  Altered mental status EXAM: CT HEAD WITHOUT CONTRAST TECHNIQUE: Contiguous axial images were obtained from the base of the skull through the vertex without intravenous contrast. RADIATION DOSE REDUCTION: This exam was performed according to the departmental dose-optimization program which includes automated exposure control, adjustment of the mA and/or kV according to patient size and/or use of iterative reconstruction technique. COMPARISON:  08/01/2020 FINDINGS: Brain: No acute  intracranial findings are seen. There are no signs of bleeding within the cranium. Cortical sulci are prominent. There is decreased density in periventricular and subcortical white matter. Vascular: Scattered arterial calcifications are seen. Skull: No fracture is seen in calvarium. There are 2 small foci of soft tissue thickening in the right parietal scalp 1 with coarse calcifications, possibly sebaceous cysts. Sinuses/Orbits: Unremarkable. Other: None. IMPRESSION: No acute intracranial findings are seen in noncontrast CT brain. Atrophy. Small vessel  disease. Electronically Signed   By: Elmer Picker M.D.   On: 09/17/2021 12:36    Procedures Procedures    Medications Ordered in ED Medications  cephALEXin (KEFLEX) capsule 250 mg (has no administration in time range)    ED Course/ Medical Decision Making/ A&P Clinical Course as of 09/17/21 1621  Mon Sep 17, 2021  1510 At this time the patient is clinically well.  Family members state her confusion is improving somewhat but still persistent.  Urinalysis pending [EW]    Clinical Course User Index [EW] Daleen Bo, MD                           Medical Decision Making Patient presenting for short-term symptoms about 3 days of nonspecific confusion.  No clear acute neurologic abnormality.  Symptoms most consistent with nonspecific delirium.  Patient will require evaluation for toxic and metabolic disorders as well as possible occult stroke/CNS abnormality.  Amount and/or Complexity of Data Reviewed Independent Historian:     Details: Family members at bedside give history, son and a female family member. Labs: ordered.    Details: CBC, metabolic panel, urinalysis-normal except glucose high, BUN high, creatinine high, urinalysis indicating UTI.  Urine culture ordered Radiology: ordered and independent interpretation performed.    Details: CT head -- no acute abnormality.  Risk Prescription drug management. Decision regarding  hospitalization. Risk Details: Patient presenting with nonspecific confusion, for about 3 days.  She is advanced elderly female who lives at home with support by aide and family members.  Her confusion is mild.  She is not significantly dysarthric or aphasic.  Patient improved somewhat during period of observation in the ED.  She is stable for discharge.  She appears to have a UTI.  Keflex started in the ED and urine culture sent.  Anticipate that she will improve with treatment and can follow-up with PCP as an outpatient.  Recommend 1 week follow-up for repeat urine testing.           Final Clinical Impression(s) / ED Diagnoses Final diagnoses:  Delirium  Urinary tract infection without hematuria, site unspecified    Rx / DC Orders ED Discharge Orders          Ordered    cephALEXin (KEFLEX) 250 MG capsule  4 times daily        09/17/21 1617              Daleen Bo, MD 09/17/21 1621

## 2021-09-17 NOTE — ED Notes (Signed)
Labeled urine specimen and culture sent to lab. [facility] 

## 2021-09-18 ENCOUNTER — Other Ambulatory Visit: Payer: Self-pay

## 2021-09-18 DIAGNOSIS — I639 Cerebral infarction, unspecified: Secondary | ICD-10-CM

## 2021-09-19 ENCOUNTER — Ambulatory Visit: Payer: Self-pay

## 2021-09-19 ENCOUNTER — Telehealth: Payer: Self-pay | Admitting: *Deleted

## 2021-09-19 DIAGNOSIS — Z9181 History of falling: Secondary | ICD-10-CM | POA: Diagnosis not present

## 2021-09-19 DIAGNOSIS — Z7409 Other reduced mobility: Secondary | ICD-10-CM | POA: Diagnosis not present

## 2021-09-19 DIAGNOSIS — R2689 Other abnormalities of gait and mobility: Secondary | ICD-10-CM | POA: Diagnosis not present

## 2021-09-19 DIAGNOSIS — Z789 Other specified health status: Secondary | ICD-10-CM | POA: Diagnosis not present

## 2021-09-19 DIAGNOSIS — G629 Polyneuropathy, unspecified: Secondary | ICD-10-CM | POA: Diagnosis not present

## 2021-09-19 DIAGNOSIS — G319 Degenerative disease of nervous system, unspecified: Secondary | ICD-10-CM | POA: Diagnosis not present

## 2021-09-19 DIAGNOSIS — N3001 Acute cystitis with hematuria: Secondary | ICD-10-CM | POA: Diagnosis not present

## 2021-09-19 NOTE — Patient Outreach (Signed)
  Care Coordination   Initial Visit Note   09/19/2021 Name: Cindy Smith MRN: [id number] DOB: [birth year]  Cindy Smith is a 88 y.o. year old female who sees Tisovec, Fransico Him, MD for primary care. I  spoke with patients son and caregiver Cindy Smith by phone.  What matters to the patients health and wellness today?  To find long term care placement    Goals Addressed             This Visit's Progress    My mom needs placement       Care Coordination Interventions: Telephonic visit completed with the patients son and caregiver Cindy Smith Discussed the patients physical health is deteriorating and she is no longer able to live independently at home Cindy Smith reports the patient needs assistance with meal preparation, medication management, bathing, dressing, and toileting. Cindy Smith indicates patient was seen by primary care provider today who has completed an FL2 indicating skilled nursing for level of care Determined the patient is able to privately pay for placement and family would prefer to stay in [facility] if possible Reviewed process for placement may take several days due to needing paperwork prior to move in Provided Cindy Smith with contact numbers to [facility] as well as [facility] to arrange private duty in home care to supplement until placement is achieved SW placed calls to the following SNF's in [facility]: [place] message left [place]- voice message left [facility] - voice message left [facility]- spoke to [place]. There is an available bed and she will contact patients son [facility] - voice message left [facility] - voice message left Outbound call placed to Cindy Smith to advise of bed availability at [facility]  Provided contact number to Cindy Smith who will follow up with [facility] Discussed plans for SW to follow up on Friday to determine goal progression        SDOH assessments and interventions  completed:  Yes  SDOH Interventions Today    Flowsheet Row Most Recent Value  SDOH Interventions   Food Insecurity Interventions Intervention Not Indicated  Housing Interventions Intervention Not Indicated  Transportation Interventions Intervention Not Indicated        Care Coordination Interventions Activated:  Yes  Care Coordination Interventions:  Yes, provided   Follow up plan: Follow up call scheduled for 8/18    Encounter Outcome:  Pt. Visit Completed   Daneen Schick, BSW, CDP Social Worker, Certified Dementia Practitioner Care Coordination [phone]

## 2021-09-19 NOTE — Chronic Care Management (AMB) (Signed)
  Care Coordination   Note   09/19/2021 Name: Cindy Smith MRN: [id number] DOB: March 30, 1923  Cindy Smith is a 88 y.o. year old female who sees Tisovec, Fransico Him, MD for primary care. I reached out to Cindy Smith by phone today to offer care coordination services.  Cindy Smith was given information about Care Coordination services today including:   The Care Coordination services include support from the care team which includes your Nurse Coordinator, Clinical Social Worker, or Pharmacist.  The Care Coordination team is here to help remove barriers to the health concerns and goals most important to you. Care Coordination services are voluntary, and the patient may decline or stop services at any time by request to their care team member.   Care Coordination Consent Status: Patient agreed to services and verbal consent obtained.   Follow up plan:  Telephone appointment with care coordination team member scheduled for:  09/19/21  Encounter Outcome:  Pt. Scheduled  [place]  Direct Dial: [phone]

## 2021-09-19 NOTE — Patient Instructions (Signed)
Visit Information  Thank you for taking time to visit with me today. Please don't hesitate to contact me if I can be of assistance to you.   Following are the goals we discussed today:   Goals Addressed             This Visit's Progress    My mom needs placement       Care Coordination Interventions: Telephonic visit completed with the patients son and caregiver Jenny Reichmann Discussed the patients physical health is deteriorating and she is no longer able to live independently at home Jenny Reichmann reports the patient needs assistance with meal preparation, medication management, bathing, dressing, and toileting. John indicates patient was seen by primary care provider today who has completed an FL2 indicating skilled nursing for level of care Determined the patient is able to privately pay for placement and family would prefer to stay in [facility] if possible Reviewed process for placement may take several days due to needing paperwork prior to move in Provided John with contact numbers to [facility] as well as [facility] to arrange private duty in home care to supplement until placement is achieved SW placed calls to the following SNF's in [facility]: [place] message left [place]- voice message left [facility] - voice message left [facility]- spoke to [facility]. There is an available bed and she will contact patients son [facility] - voice message left [facility] - voice message left Outbound call placed to John to advise of bed availability at [facility]  Provided contact number to Jenny Reichmann who will follow up with [facility] Discussed plans for SW to follow up on Friday to determine goal progression        Our next appointment is by telephone on 8/18 at 11:15  Please call the care guide team at [phone] if you need to cancel or reschedule your appointment.   If you are experiencing a Mental Health or [place]  or need someone to talk to, please go to [facility] [address] [place] [phone])  Patient verbalizes understanding of instructions and care plan provided today and agrees to view in [place]. Active MyChart status and patient understanding of how to access instructions and care plan via MyChart confirmed with patient.     Telephone follow up appointment with care management team member scheduled for:8/18.  Daneen Schick, BSW, CDP Social Worker, Certified Dementia Practitioner Care Coordination [phone]

## 2021-09-21 ENCOUNTER — Ambulatory Visit: Payer: Self-pay

## 2021-09-21 NOTE — Patient Instructions (Signed)
Visit Information  Thank you for taking time to visit with me today. Please don't hesitate to contact me if I can be of assistance to you.   Following are the goals we discussed today:   Goals Addressed             This Visit's Progress    COMPLETED: My mom needs placement       Care Coordination Interventions: Telephonic visit completed with the patients son and caregiver Jenny Reichmann  to assess goal progression Determined Jenny Reichmann was contacted by [facility] who is able to accept the patient, this is the patient and families first choice for placement Patient is set to move in to long-term care this afternoon Encouraged Jenny Reichmann to contact patients primary care provider as needed        Please call the care guide team at [phone] if you need to schedule an appointment with our care coordination team.  If you are experiencing a Mental Health or [place] or need someone to talk to, please call [phone]-TALK (toll free, 24 hour hotline)  Patient verbalizes understanding of instructions and care plan provided today and agrees to view in [place]. Active MyChart status and patient understanding of how to access instructions and care plan via MyChart confirmed with patient.     No further follow up required: Please contact your primary care provider as needed and enjoy [place]!  Daneen Schick, BSW, CDP Social Worker, Certified Dementia Practitioner Care Coordination [phone]

## 2021-09-21 NOTE — Patient Outreach (Signed)
  Care Coordination   Follow Up Visit Note   09/21/2021 Name: YOLANDO GILLUM MRN: [id number] DOB: 27-May-1923  Jesusita Oka is a 88 y.o. year old female who sees Tisovec, Fransico Him, MD for primary care. I  spoke with patients son and caregiver Jenny Reichmann today by phone.  What matters to the patients health and wellness today?  Obtain long-term care placement for my mom    Goals Addressed             This Visit's Progress    COMPLETED: My mom needs placement       Care Coordination Interventions: Telephonic visit completed with the patients son and caregiver Jenny Reichmann  to assess goal progression Determined Jenny Reichmann was contacted by [facility] who is able to accept the patient, this is the patient and families first choice for placement Patient is set to move in to long-term care this afternoon Encouraged John to contact patients primary care provider as needed        SDOH assessments and interventions completed:  No     Care Coordination Interventions Activated:  Yes  Care Coordination Interventions:  Yes, provided   Follow up plan: No further intervention required.   Encounter Outcome:  Pt. Visit Completed   Daneen Schick, BSW, CDP Social Worker, Certified Dementia Practitioner Care Coordination [phone]

## 2021-09-22 LAB — URINE CULTURE: Culture: >=100000 — AB

## 2021-09-23 ENCOUNTER — Telehealth (HOSPITAL_BASED_OUTPATIENT_CLINIC_OR_DEPARTMENT_OTHER): Payer: Self-pay | Admitting: *Deleted

## 2021-09-23 DIAGNOSIS — R2681 Unsteadiness on feet: Secondary | ICD-10-CM | POA: Diagnosis not present

## 2021-09-23 DIAGNOSIS — R1310 Dysphagia, unspecified: Secondary | ICD-10-CM | POA: Diagnosis not present

## 2021-09-23 DIAGNOSIS — R279 Unspecified lack of coordination: Secondary | ICD-10-CM | POA: Diagnosis not present

## 2021-09-23 DIAGNOSIS — R2689 Other abnormalities of gait and mobility: Secondary | ICD-10-CM | POA: Diagnosis not present

## 2021-09-23 NOTE — Telephone Encounter (Signed)
Post ED Visit - Positive Culture Follow-up  Culture report reviewed by antimicrobial stewardship pharmacist: [place] Team '[]'$  Elenor Quinones, Pharm.D. '[]'$  Heide Guile, Pharm.D., BCPS AQ-ID '[]'$  Parks Neptune, Pharm.D., BCPS '[]'$  Alycia Rossetti, Pharm.D., BCPS '[]'$  [place], [place].D., BCPS, AAHIVP '[]'$  Legrand Como, Pharm.D., BCPS, AAHIVP '[]'$  Salome Arnt, PharmD, BCPS '[]'$  Johnnette Gourd, PharmD, BCPS '[]'$  Hughes Better, PharmD, BCPS '[]'$  Leeroy Cha, PharmD '[]'$  Laqueta Linden, PharmD, BCPS '[]'$  Albertina Parr, PharmD  [place] Team '[]'$  Leodis Sias, PharmD '[]'$  Lindell Spar, PharmD '[]'$  Royetta Asal, PharmD '[]'$  Graylin Shiver, Rph '[]'$  Rema Fendt) Glennon Mac, PharmD '[]'$  Arlyn Dunning, PharmD '[]'$  Netta Cedars, PharmD '[x]'$  Dia Sitter, PharmD '[]'$  Leone Haven, PharmD '[]'$  Gretta Arab, PharmD '[]'$  Theodis Shove, PharmD '[]'$  Peggyann Juba, PharmD '[]'$  Reuel Boom, PharmD   Positive urine culture Treated with Cephalexin, organism sensitive to the same and no further patient follow-up is required at this time.  Rosie Fate 09/23/2021, 8:11 AM

## 2021-09-24 DIAGNOSIS — R2681 Unsteadiness on feet: Secondary | ICD-10-CM | POA: Diagnosis not present

## 2021-09-24 DIAGNOSIS — H5789 Other specified disorders of eye and adnexa: Secondary | ICD-10-CM | POA: Diagnosis not present

## 2021-09-24 DIAGNOSIS — R1310 Dysphagia, unspecified: Secondary | ICD-10-CM | POA: Diagnosis not present

## 2021-09-24 DIAGNOSIS — N390 Urinary tract infection, site not specified: Secondary | ICD-10-CM | POA: Diagnosis not present

## 2021-09-24 DIAGNOSIS — R279 Unspecified lack of coordination: Secondary | ICD-10-CM | POA: Diagnosis not present

## 2021-09-24 DIAGNOSIS — Z7689 Persons encountering health services in other specified circumstances: Secondary | ICD-10-CM | POA: Diagnosis not present

## 2021-09-24 DIAGNOSIS — R52 Pain, unspecified: Secondary | ICD-10-CM | POA: Diagnosis not present

## 2021-09-24 DIAGNOSIS — R2689 Other abnormalities of gait and mobility: Secondary | ICD-10-CM | POA: Diagnosis not present

## 2021-09-25 DIAGNOSIS — I10 Essential (primary) hypertension: Secondary | ICD-10-CM | POA: Diagnosis not present

## 2021-09-25 DIAGNOSIS — R1310 Dysphagia, unspecified: Secondary | ICD-10-CM | POA: Diagnosis not present

## 2021-09-25 DIAGNOSIS — I679 Cerebrovascular disease, unspecified: Secondary | ICD-10-CM | POA: Diagnosis not present

## 2021-09-25 DIAGNOSIS — R279 Unspecified lack of coordination: Secondary | ICD-10-CM | POA: Diagnosis not present

## 2021-09-25 DIAGNOSIS — E7800 Pure hypercholesterolemia, unspecified: Secondary | ICD-10-CM | POA: Diagnosis not present

## 2021-09-25 DIAGNOSIS — G629 Polyneuropathy, unspecified: Secondary | ICD-10-CM | POA: Diagnosis not present

## 2021-09-25 DIAGNOSIS — R2681 Unsteadiness on feet: Secondary | ICD-10-CM | POA: Diagnosis not present

## 2021-09-25 DIAGNOSIS — R2689 Other abnormalities of gait and mobility: Secondary | ICD-10-CM | POA: Diagnosis not present

## 2021-09-27 DIAGNOSIS — I10 Essential (primary) hypertension: Secondary | ICD-10-CM | POA: Diagnosis not present

## 2021-09-27 DIAGNOSIS — N390 Urinary tract infection, site not specified: Secondary | ICD-10-CM | POA: Diagnosis not present

## 2021-09-27 DIAGNOSIS — R5381 Other malaise: Secondary | ICD-10-CM | POA: Diagnosis not present

## 2021-09-27 DIAGNOSIS — R531 Weakness: Secondary | ICD-10-CM | POA: Diagnosis not present

## 2021-09-28 DIAGNOSIS — R2689 Other abnormalities of gait and mobility: Secondary | ICD-10-CM | POA: Diagnosis not present

## 2021-09-28 DIAGNOSIS — R279 Unspecified lack of coordination: Secondary | ICD-10-CM | POA: Diagnosis not present

## 2021-09-28 DIAGNOSIS — R2681 Unsteadiness on feet: Secondary | ICD-10-CM | POA: Diagnosis not present

## 2021-09-28 DIAGNOSIS — R1310 Dysphagia, unspecified: Secondary | ICD-10-CM | POA: Diagnosis not present

## 2021-10-02 DIAGNOSIS — R279 Unspecified lack of coordination: Secondary | ICD-10-CM | POA: Diagnosis not present

## 2021-10-02 DIAGNOSIS — R2681 Unsteadiness on feet: Secondary | ICD-10-CM | POA: Diagnosis not present

## 2021-10-02 DIAGNOSIS — R2689 Other abnormalities of gait and mobility: Secondary | ICD-10-CM | POA: Diagnosis not present

## 2021-10-02 DIAGNOSIS — R1310 Dysphagia, unspecified: Secondary | ICD-10-CM | POA: Diagnosis not present

## 2021-10-03 DIAGNOSIS — R2689 Other abnormalities of gait and mobility: Secondary | ICD-10-CM | POA: Diagnosis not present

## 2021-10-03 DIAGNOSIS — R2681 Unsteadiness on feet: Secondary | ICD-10-CM | POA: Diagnosis not present

## 2021-10-03 DIAGNOSIS — R1310 Dysphagia, unspecified: Secondary | ICD-10-CM | POA: Diagnosis not present

## 2021-10-03 DIAGNOSIS — R279 Unspecified lack of coordination: Secondary | ICD-10-CM | POA: Diagnosis not present

## 2021-10-04 DIAGNOSIS — R2689 Other abnormalities of gait and mobility: Secondary | ICD-10-CM | POA: Diagnosis not present

## 2021-10-04 DIAGNOSIS — R1310 Dysphagia, unspecified: Secondary | ICD-10-CM | POA: Diagnosis not present

## 2021-10-04 DIAGNOSIS — R2681 Unsteadiness on feet: Secondary | ICD-10-CM | POA: Diagnosis not present

## 2021-10-04 DIAGNOSIS — R279 Unspecified lack of coordination: Secondary | ICD-10-CM | POA: Diagnosis not present

## 2021-10-08 DIAGNOSIS — M810 Age-related osteoporosis without current pathological fracture: Secondary | ICD-10-CM | POA: Diagnosis not present

## 2021-10-08 DIAGNOSIS — R2681 Unsteadiness on feet: Secondary | ICD-10-CM | POA: Diagnosis not present

## 2021-10-08 DIAGNOSIS — R279 Unspecified lack of coordination: Secondary | ICD-10-CM | POA: Diagnosis not present

## 2021-10-08 DIAGNOSIS — R2689 Other abnormalities of gait and mobility: Secondary | ICD-10-CM | POA: Diagnosis not present

## 2021-10-09 DIAGNOSIS — R2681 Unsteadiness on feet: Secondary | ICD-10-CM | POA: Diagnosis not present

## 2021-10-09 DIAGNOSIS — R279 Unspecified lack of coordination: Secondary | ICD-10-CM | POA: Diagnosis not present

## 2021-10-09 DIAGNOSIS — K5900 Constipation, unspecified: Secondary | ICD-10-CM | POA: Diagnosis not present

## 2021-10-09 DIAGNOSIS — M810 Age-related osteoporosis without current pathological fracture: Secondary | ICD-10-CM | POA: Diagnosis not present

## 2021-10-09 DIAGNOSIS — J302 Other seasonal allergic rhinitis: Secondary | ICD-10-CM | POA: Diagnosis not present

## 2021-10-09 DIAGNOSIS — R2689 Other abnormalities of gait and mobility: Secondary | ICD-10-CM | POA: Diagnosis not present

## 2021-10-11 DIAGNOSIS — M810 Age-related osteoporosis without current pathological fracture: Secondary | ICD-10-CM | POA: Diagnosis not present

## 2021-10-11 DIAGNOSIS — R2689 Other abnormalities of gait and mobility: Secondary | ICD-10-CM | POA: Diagnosis not present

## 2021-10-11 DIAGNOSIS — R279 Unspecified lack of coordination: Secondary | ICD-10-CM | POA: Diagnosis not present

## 2021-10-11 DIAGNOSIS — R2681 Unsteadiness on feet: Secondary | ICD-10-CM | POA: Diagnosis not present

## 2021-10-12 DIAGNOSIS — M810 Age-related osteoporosis without current pathological fracture: Secondary | ICD-10-CM | POA: Diagnosis not present

## 2021-10-12 DIAGNOSIS — R2689 Other abnormalities of gait and mobility: Secondary | ICD-10-CM | POA: Diagnosis not present

## 2021-10-12 DIAGNOSIS — R2681 Unsteadiness on feet: Secondary | ICD-10-CM | POA: Diagnosis not present

## 2021-10-12 DIAGNOSIS — R279 Unspecified lack of coordination: Secondary | ICD-10-CM | POA: Diagnosis not present

## 2021-10-15 DIAGNOSIS — R630 Anorexia: Secondary | ICD-10-CM | POA: Diagnosis not present

## 2021-10-15 DIAGNOSIS — Z7689 Persons encountering health services in other specified circumstances: Secondary | ICD-10-CM | POA: Diagnosis not present

## 2021-10-15 DIAGNOSIS — F320 Major depressive disorder, single episode, mild: Secondary | ICD-10-CM | POA: Diagnosis not present

## 2021-10-15 DIAGNOSIS — R2689 Other abnormalities of gait and mobility: Secondary | ICD-10-CM | POA: Diagnosis not present

## 2021-10-15 DIAGNOSIS — R279 Unspecified lack of coordination: Secondary | ICD-10-CM | POA: Diagnosis not present

## 2021-10-15 DIAGNOSIS — R2681 Unsteadiness on feet: Secondary | ICD-10-CM | POA: Diagnosis not present

## 2021-10-15 DIAGNOSIS — R410 Disorientation, unspecified: Secondary | ICD-10-CM | POA: Diagnosis not present

## 2021-10-15 DIAGNOSIS — M810 Age-related osteoporosis without current pathological fracture: Secondary | ICD-10-CM | POA: Diagnosis not present

## 2021-10-16 DIAGNOSIS — R2689 Other abnormalities of gait and mobility: Secondary | ICD-10-CM | POA: Diagnosis not present

## 2021-10-16 DIAGNOSIS — N390 Urinary tract infection, site not specified: Secondary | ICD-10-CM | POA: Diagnosis not present

## 2021-10-16 DIAGNOSIS — R279 Unspecified lack of coordination: Secondary | ICD-10-CM | POA: Diagnosis not present

## 2021-10-16 DIAGNOSIS — M810 Age-related osteoporosis without current pathological fracture: Secondary | ICD-10-CM | POA: Diagnosis not present

## 2021-10-16 DIAGNOSIS — R2681 Unsteadiness on feet: Secondary | ICD-10-CM | POA: Diagnosis not present

## 2021-10-18 DIAGNOSIS — N390 Urinary tract infection, site not specified: Secondary | ICD-10-CM | POA: Diagnosis not present

## 2021-10-18 DIAGNOSIS — M810 Age-related osteoporosis without current pathological fracture: Secondary | ICD-10-CM | POA: Diagnosis not present

## 2021-10-18 DIAGNOSIS — R2689 Other abnormalities of gait and mobility: Secondary | ICD-10-CM | POA: Diagnosis not present

## 2021-10-18 DIAGNOSIS — R279 Unspecified lack of coordination: Secondary | ICD-10-CM | POA: Diagnosis not present

## 2021-10-18 DIAGNOSIS — R2681 Unsteadiness on feet: Secondary | ICD-10-CM | POA: Diagnosis not present

## 2021-10-19 DIAGNOSIS — R2681 Unsteadiness on feet: Secondary | ICD-10-CM | POA: Diagnosis not present

## 2021-10-19 DIAGNOSIS — R279 Unspecified lack of coordination: Secondary | ICD-10-CM | POA: Diagnosis not present

## 2021-10-19 DIAGNOSIS — R2689 Other abnormalities of gait and mobility: Secondary | ICD-10-CM | POA: Diagnosis not present

## 2021-10-19 DIAGNOSIS — M810 Age-related osteoporosis without current pathological fracture: Secondary | ICD-10-CM | POA: Diagnosis not present

## 2021-10-22 DIAGNOSIS — R2689 Other abnormalities of gait and mobility: Secondary | ICD-10-CM | POA: Diagnosis not present

## 2021-10-22 DIAGNOSIS — M810 Age-related osteoporosis without current pathological fracture: Secondary | ICD-10-CM | POA: Diagnosis not present

## 2021-10-22 DIAGNOSIS — R2681 Unsteadiness on feet: Secondary | ICD-10-CM | POA: Diagnosis not present

## 2021-10-22 DIAGNOSIS — R279 Unspecified lack of coordination: Secondary | ICD-10-CM | POA: Diagnosis not present

## 2021-10-23 DIAGNOSIS — R279 Unspecified lack of coordination: Secondary | ICD-10-CM | POA: Diagnosis not present

## 2021-10-23 DIAGNOSIS — R2689 Other abnormalities of gait and mobility: Secondary | ICD-10-CM | POA: Diagnosis not present

## 2021-10-23 DIAGNOSIS — R2681 Unsteadiness on feet: Secondary | ICD-10-CM | POA: Diagnosis not present

## 2021-10-23 DIAGNOSIS — M810 Age-related osteoporosis without current pathological fracture: Secondary | ICD-10-CM | POA: Diagnosis not present

## 2021-10-30 DIAGNOSIS — S90822A Blister (nonthermal), left foot, initial encounter: Secondary | ICD-10-CM | POA: Diagnosis not present

## 2021-10-30 DIAGNOSIS — L602 Onychogryphosis: Secondary | ICD-10-CM | POA: Diagnosis not present

## 2021-10-30 DIAGNOSIS — L539 Erythematous condition, unspecified: Secondary | ICD-10-CM | POA: Diagnosis not present

## 2021-10-31 DIAGNOSIS — E7800 Pure hypercholesterolemia, unspecified: Secondary | ICD-10-CM | POA: Diagnosis not present

## 2021-10-31 DIAGNOSIS — I10 Essential (primary) hypertension: Secondary | ICD-10-CM | POA: Diagnosis not present

## 2021-10-31 DIAGNOSIS — G629 Polyneuropathy, unspecified: Secondary | ICD-10-CM | POA: Diagnosis not present

## 2021-10-31 DIAGNOSIS — S90812A Abrasion, left foot, initial encounter: Secondary | ICD-10-CM | POA: Diagnosis not present

## 2021-11-05 DIAGNOSIS — F4322 Adjustment disorder with anxiety: Secondary | ICD-10-CM | POA: Diagnosis not present

## 2021-11-09 DIAGNOSIS — S90812A Abrasion, left foot, initial encounter: Secondary | ICD-10-CM | POA: Diagnosis not present

## 2021-11-09 DIAGNOSIS — Z23 Encounter for immunization: Secondary | ICD-10-CM | POA: Diagnosis not present

## 2021-11-09 DIAGNOSIS — E7800 Pure hypercholesterolemia, unspecified: Secondary | ICD-10-CM | POA: Diagnosis not present

## 2021-11-09 DIAGNOSIS — G629 Polyneuropathy, unspecified: Secondary | ICD-10-CM | POA: Diagnosis not present

## 2021-11-09 DIAGNOSIS — I10 Essential (primary) hypertension: Secondary | ICD-10-CM | POA: Diagnosis not present

## 2021-11-14 DIAGNOSIS — G629 Polyneuropathy, unspecified: Secondary | ICD-10-CM | POA: Diagnosis not present

## 2021-11-14 DIAGNOSIS — H401133 Primary open-angle glaucoma, bilateral, severe stage: Secondary | ICD-10-CM | POA: Diagnosis not present

## 2021-11-14 DIAGNOSIS — E7800 Pure hypercholesterolemia, unspecified: Secondary | ICD-10-CM | POA: Diagnosis not present

## 2021-11-14 DIAGNOSIS — S90812A Abrasion, left foot, initial encounter: Secondary | ICD-10-CM | POA: Diagnosis not present

## 2021-11-14 DIAGNOSIS — I10 Essential (primary) hypertension: Secondary | ICD-10-CM | POA: Diagnosis not present

## 2021-11-19 DIAGNOSIS — G4700 Insomnia, unspecified: Secondary | ICD-10-CM | POA: Diagnosis not present

## 2021-11-19 DIAGNOSIS — F4322 Adjustment disorder with anxiety: Secondary | ICD-10-CM | POA: Diagnosis not present

## 2021-11-20 DIAGNOSIS — I10 Essential (primary) hypertension: Secondary | ICD-10-CM | POA: Diagnosis not present

## 2021-11-20 DIAGNOSIS — E7800 Pure hypercholesterolemia, unspecified: Secondary | ICD-10-CM | POA: Diagnosis not present

## 2021-11-20 DIAGNOSIS — G629 Polyneuropathy, unspecified: Secondary | ICD-10-CM | POA: Diagnosis not present

## 2021-11-20 DIAGNOSIS — S90812A Abrasion, left foot, initial encounter: Secondary | ICD-10-CM | POA: Diagnosis not present

## 2021-11-20 DIAGNOSIS — M6281 Muscle weakness (generalized): Secondary | ICD-10-CM | POA: Diagnosis not present

## 2021-11-23 DIAGNOSIS — G629 Polyneuropathy, unspecified: Secondary | ICD-10-CM | POA: Diagnosis not present

## 2021-11-23 DIAGNOSIS — M6281 Muscle weakness (generalized): Secondary | ICD-10-CM | POA: Diagnosis not present

## 2021-11-26 DIAGNOSIS — S90822D Blister (nonthermal), left foot, subsequent encounter: Secondary | ICD-10-CM | POA: Diagnosis not present

## 2021-11-26 DIAGNOSIS — Z7689 Persons encountering health services in other specified circumstances: Secondary | ICD-10-CM | POA: Diagnosis not present

## 2021-11-27 DIAGNOSIS — G629 Polyneuropathy, unspecified: Secondary | ICD-10-CM | POA: Diagnosis not present

## 2021-11-27 DIAGNOSIS — M6281 Muscle weakness (generalized): Secondary | ICD-10-CM | POA: Diagnosis not present

## 2021-11-28 DIAGNOSIS — G629 Polyneuropathy, unspecified: Secondary | ICD-10-CM | POA: Diagnosis not present

## 2021-11-28 DIAGNOSIS — I10 Essential (primary) hypertension: Secondary | ICD-10-CM | POA: Diagnosis not present

## 2021-11-28 DIAGNOSIS — E7800 Pure hypercholesterolemia, unspecified: Secondary | ICD-10-CM | POA: Diagnosis not present

## 2021-11-28 DIAGNOSIS — S90812A Abrasion, left foot, initial encounter: Secondary | ICD-10-CM | POA: Diagnosis not present

## 2021-11-28 DIAGNOSIS — M6281 Muscle weakness (generalized): Secondary | ICD-10-CM | POA: Diagnosis not present

## 2021-12-03 DIAGNOSIS — G4700 Insomnia, unspecified: Secondary | ICD-10-CM | POA: Diagnosis not present

## 2021-12-04 DIAGNOSIS — G629 Polyneuropathy, unspecified: Secondary | ICD-10-CM | POA: Diagnosis not present

## 2021-12-04 DIAGNOSIS — M6281 Muscle weakness (generalized): Secondary | ICD-10-CM | POA: Diagnosis not present

## 2021-12-07 DIAGNOSIS — Z6820 Body mass index (BMI) 20.0-20.9, adult: Secondary | ICD-10-CM | POA: Diagnosis not present

## 2021-12-07 DIAGNOSIS — E559 Vitamin D deficiency, unspecified: Secondary | ICD-10-CM | POA: Diagnosis not present

## 2021-12-07 DIAGNOSIS — K5900 Constipation, unspecified: Secondary | ICD-10-CM | POA: Diagnosis not present

## 2021-12-07 DIAGNOSIS — J302 Other seasonal allergic rhinitis: Secondary | ICD-10-CM | POA: Diagnosis not present

## 2021-12-07 DIAGNOSIS — H409 Unspecified glaucoma: Secondary | ICD-10-CM | POA: Diagnosis not present

## 2021-12-07 DIAGNOSIS — G4700 Insomnia, unspecified: Secondary | ICD-10-CM | POA: Diagnosis not present

## 2021-12-07 DIAGNOSIS — I10 Essential (primary) hypertension: Secondary | ICD-10-CM | POA: Diagnosis not present

## 2021-12-10 DIAGNOSIS — E039 Hypothyroidism, unspecified: Secondary | ICD-10-CM | POA: Diagnosis not present

## 2021-12-10 DIAGNOSIS — Z79899 Other long term (current) drug therapy: Secondary | ICD-10-CM | POA: Diagnosis not present

## 2021-12-10 DIAGNOSIS — E559 Vitamin D deficiency, unspecified: Secondary | ICD-10-CM | POA: Diagnosis not present

## 2021-12-12 DIAGNOSIS — G319 Degenerative disease of nervous system, unspecified: Secondary | ICD-10-CM | POA: Diagnosis not present

## 2021-12-12 DIAGNOSIS — M6281 Muscle weakness (generalized): Secondary | ICD-10-CM | POA: Diagnosis not present

## 2021-12-12 DIAGNOSIS — I679 Cerebrovascular disease, unspecified: Secondary | ICD-10-CM | POA: Diagnosis not present

## 2021-12-13 DIAGNOSIS — D649 Anemia, unspecified: Secondary | ICD-10-CM | POA: Diagnosis not present

## 2021-12-13 DIAGNOSIS — N1831 Chronic kidney disease, stage 3a: Secondary | ICD-10-CM | POA: Diagnosis not present

## 2021-12-13 DIAGNOSIS — R946 Abnormal results of thyroid function studies: Secondary | ICD-10-CM | POA: Diagnosis not present

## 2021-12-13 DIAGNOSIS — E860 Dehydration: Secondary | ICD-10-CM | POA: Diagnosis not present

## 2021-12-14 DIAGNOSIS — M6281 Muscle weakness (generalized): Secondary | ICD-10-CM | POA: Diagnosis not present

## 2021-12-14 DIAGNOSIS — Z79899 Other long term (current) drug therapy: Secondary | ICD-10-CM | POA: Diagnosis not present

## 2021-12-14 DIAGNOSIS — I679 Cerebrovascular disease, unspecified: Secondary | ICD-10-CM | POA: Diagnosis not present

## 2021-12-14 DIAGNOSIS — G319 Degenerative disease of nervous system, unspecified: Secondary | ICD-10-CM | POA: Diagnosis not present

## 2021-12-17 DIAGNOSIS — G319 Degenerative disease of nervous system, unspecified: Secondary | ICD-10-CM | POA: Diagnosis not present

## 2021-12-17 DIAGNOSIS — M6281 Muscle weakness (generalized): Secondary | ICD-10-CM | POA: Diagnosis not present

## 2021-12-17 DIAGNOSIS — G4700 Insomnia, unspecified: Secondary | ICD-10-CM | POA: Diagnosis not present

## 2021-12-17 DIAGNOSIS — I679 Cerebrovascular disease, unspecified: Secondary | ICD-10-CM | POA: Diagnosis not present

## 2021-12-18 DIAGNOSIS — E890 Postprocedural hypothyroidism: Secondary | ICD-10-CM | POA: Diagnosis not present

## 2021-12-19 DIAGNOSIS — F4322 Adjustment disorder with anxiety: Secondary | ICD-10-CM | POA: Diagnosis not present

## 2021-12-19 DIAGNOSIS — I679 Cerebrovascular disease, unspecified: Secondary | ICD-10-CM | POA: Diagnosis not present

## 2021-12-19 DIAGNOSIS — G319 Degenerative disease of nervous system, unspecified: Secondary | ICD-10-CM | POA: Diagnosis not present

## 2021-12-19 DIAGNOSIS — M6281 Muscle weakness (generalized): Secondary | ICD-10-CM | POA: Diagnosis not present

## 2021-12-24 DIAGNOSIS — M6281 Muscle weakness (generalized): Secondary | ICD-10-CM | POA: Diagnosis not present

## 2021-12-24 DIAGNOSIS — G319 Degenerative disease of nervous system, unspecified: Secondary | ICD-10-CM | POA: Diagnosis not present

## 2021-12-24 DIAGNOSIS — I679 Cerebrovascular disease, unspecified: Secondary | ICD-10-CM | POA: Diagnosis not present

## 2021-12-26 DIAGNOSIS — I679 Cerebrovascular disease, unspecified: Secondary | ICD-10-CM | POA: Diagnosis not present

## 2021-12-26 DIAGNOSIS — G319 Degenerative disease of nervous system, unspecified: Secondary | ICD-10-CM | POA: Diagnosis not present

## 2021-12-26 DIAGNOSIS — M6281 Muscle weakness (generalized): Secondary | ICD-10-CM | POA: Diagnosis not present

## 2021-12-28 DIAGNOSIS — I679 Cerebrovascular disease, unspecified: Secondary | ICD-10-CM | POA: Diagnosis not present

## 2021-12-28 DIAGNOSIS — M6281 Muscle weakness (generalized): Secondary | ICD-10-CM | POA: Diagnosis not present

## 2021-12-28 DIAGNOSIS — G319 Degenerative disease of nervous system, unspecified: Secondary | ICD-10-CM | POA: Diagnosis not present

## 2021-12-31 DIAGNOSIS — F4322 Adjustment disorder with anxiety: Secondary | ICD-10-CM | POA: Diagnosis not present

## 2022-01-01 DIAGNOSIS — G4700 Insomnia, unspecified: Secondary | ICD-10-CM | POA: Diagnosis not present

## 2022-01-14 DIAGNOSIS — G4700 Insomnia, unspecified: Secondary | ICD-10-CM | POA: Diagnosis not present

## 2022-01-14 DIAGNOSIS — F4322 Adjustment disorder with anxiety: Secondary | ICD-10-CM | POA: Diagnosis not present

## 2022-02-01 DIAGNOSIS — I10 Essential (primary) hypertension: Secondary | ICD-10-CM | POA: Diagnosis not present

## 2022-02-05 DIAGNOSIS — E890 Postprocedural hypothyroidism: Secondary | ICD-10-CM | POA: Diagnosis not present

## 2022-02-25 DIAGNOSIS — F330 Major depressive disorder, recurrent, mild: Secondary | ICD-10-CM | POA: Diagnosis not present

## 2022-02-25 DIAGNOSIS — G4700 Insomnia, unspecified: Secondary | ICD-10-CM | POA: Diagnosis not present

## 2022-02-25 DIAGNOSIS — F4322 Adjustment disorder with anxiety: Secondary | ICD-10-CM | POA: Diagnosis not present

## 2022-02-27 DIAGNOSIS — E7800 Pure hypercholesterolemia, unspecified: Secondary | ICD-10-CM | POA: Diagnosis not present

## 2022-02-27 DIAGNOSIS — I10 Essential (primary) hypertension: Secondary | ICD-10-CM | POA: Diagnosis not present

## 2022-02-27 DIAGNOSIS — G629 Polyneuropathy, unspecified: Secondary | ICD-10-CM | POA: Diagnosis not present

## 2022-02-27 DIAGNOSIS — S90812A Abrasion, left foot, initial encounter: Secondary | ICD-10-CM | POA: Diagnosis not present

## 2022-03-13 DIAGNOSIS — F4322 Adjustment disorder with anxiety: Secondary | ICD-10-CM | POA: Diagnosis not present

## 2022-03-28 DIAGNOSIS — J302 Other seasonal allergic rhinitis: Secondary | ICD-10-CM | POA: Diagnosis not present

## 2022-03-28 DIAGNOSIS — H409 Unspecified glaucoma: Secondary | ICD-10-CM | POA: Diagnosis not present

## 2022-03-28 DIAGNOSIS — Z681 Body mass index (BMI) 19 or less, adult: Secondary | ICD-10-CM | POA: Diagnosis not present

## 2022-03-28 DIAGNOSIS — F419 Anxiety disorder, unspecified: Secondary | ICD-10-CM | POA: Diagnosis not present

## 2022-03-28 DIAGNOSIS — E559 Vitamin D deficiency, unspecified: Secondary | ICD-10-CM | POA: Diagnosis not present

## 2022-03-28 DIAGNOSIS — I10 Essential (primary) hypertension: Secondary | ICD-10-CM | POA: Diagnosis not present

## 2022-03-28 DIAGNOSIS — K5900 Constipation, unspecified: Secondary | ICD-10-CM | POA: Diagnosis not present

## 2022-03-28 DIAGNOSIS — G4700 Insomnia, unspecified: Secondary | ICD-10-CM | POA: Diagnosis not present

## 2022-04-03 DIAGNOSIS — R062 Wheezing: Secondary | ICD-10-CM

## 2022-04-04 DIAGNOSIS — D649 Anemia, unspecified: Secondary | ICD-10-CM

## 2022-04-04 DIAGNOSIS — N1831 Chronic kidney disease, stage 3a: Secondary | ICD-10-CM

## 2022-04-04 DIAGNOSIS — I10 Essential (primary) hypertension: Secondary | ICD-10-CM | POA: Diagnosis not present

## 2022-04-04 DIAGNOSIS — R062 Wheezing: Secondary | ICD-10-CM

## 2022-04-05 DIAGNOSIS — R1310 Dysphagia, unspecified: Secondary | ICD-10-CM | POA: Diagnosis not present

## 2022-04-05 DIAGNOSIS — I679 Cerebrovascular disease, unspecified: Secondary | ICD-10-CM | POA: Diagnosis not present

## 2022-04-05 DIAGNOSIS — M810 Age-related osteoporosis without current pathological fracture: Secondary | ICD-10-CM | POA: Diagnosis not present

## 2022-04-05 DIAGNOSIS — E559 Vitamin D deficiency, unspecified: Secondary | ICD-10-CM | POA: Diagnosis not present

## 2022-04-08 DIAGNOSIS — F4322 Adjustment disorder with anxiety: Secondary | ICD-10-CM | POA: Diagnosis not present

## 2022-04-10 DIAGNOSIS — I679 Cerebrovascular disease, unspecified: Secondary | ICD-10-CM | POA: Diagnosis not present

## 2022-04-10 DIAGNOSIS — R1310 Dysphagia, unspecified: Secondary | ICD-10-CM | POA: Diagnosis not present

## 2022-04-10 DIAGNOSIS — M810 Age-related osteoporosis without current pathological fracture: Secondary | ICD-10-CM | POA: Diagnosis not present

## 2022-04-10 DIAGNOSIS — E559 Vitamin D deficiency, unspecified: Secondary | ICD-10-CM | POA: Diagnosis not present

## 2022-04-11 DIAGNOSIS — M810 Age-related osteoporosis without current pathological fracture: Secondary | ICD-10-CM | POA: Diagnosis not present

## 2022-04-11 DIAGNOSIS — I679 Cerebrovascular disease, unspecified: Secondary | ICD-10-CM | POA: Diagnosis not present

## 2022-04-11 DIAGNOSIS — E559 Vitamin D deficiency, unspecified: Secondary | ICD-10-CM | POA: Diagnosis not present

## 2022-04-11 DIAGNOSIS — R1310 Dysphagia, unspecified: Secondary | ICD-10-CM | POA: Diagnosis not present

## 2022-04-13 DIAGNOSIS — I679 Cerebrovascular disease, unspecified: Secondary | ICD-10-CM | POA: Diagnosis not present

## 2022-04-13 DIAGNOSIS — R1310 Dysphagia, unspecified: Secondary | ICD-10-CM | POA: Diagnosis not present

## 2022-04-13 DIAGNOSIS — E559 Vitamin D deficiency, unspecified: Secondary | ICD-10-CM | POA: Diagnosis not present

## 2022-04-13 DIAGNOSIS — M810 Age-related osteoporosis without current pathological fracture: Secondary | ICD-10-CM | POA: Diagnosis not present

## 2022-04-16 DIAGNOSIS — M810 Age-related osteoporosis without current pathological fracture: Secondary | ICD-10-CM | POA: Diagnosis not present

## 2022-04-16 DIAGNOSIS — R1310 Dysphagia, unspecified: Secondary | ICD-10-CM | POA: Diagnosis not present

## 2022-04-16 DIAGNOSIS — E559 Vitamin D deficiency, unspecified: Secondary | ICD-10-CM | POA: Diagnosis not present

## 2022-04-16 DIAGNOSIS — I679 Cerebrovascular disease, unspecified: Secondary | ICD-10-CM | POA: Diagnosis not present

## 2022-04-18 DIAGNOSIS — M810 Age-related osteoporosis without current pathological fracture: Secondary | ICD-10-CM | POA: Diagnosis not present

## 2022-04-18 DIAGNOSIS — R1310 Dysphagia, unspecified: Secondary | ICD-10-CM | POA: Diagnosis not present

## 2022-04-18 DIAGNOSIS — E559 Vitamin D deficiency, unspecified: Secondary | ICD-10-CM | POA: Diagnosis not present

## 2022-04-18 DIAGNOSIS — I679 Cerebrovascular disease, unspecified: Secondary | ICD-10-CM | POA: Diagnosis not present

## 2022-04-20 DIAGNOSIS — M810 Age-related osteoporosis without current pathological fracture: Secondary | ICD-10-CM | POA: Diagnosis not present

## 2022-04-20 DIAGNOSIS — E559 Vitamin D deficiency, unspecified: Secondary | ICD-10-CM | POA: Diagnosis not present

## 2022-04-20 DIAGNOSIS — I679 Cerebrovascular disease, unspecified: Secondary | ICD-10-CM | POA: Diagnosis not present

## 2022-04-20 DIAGNOSIS — R1310 Dysphagia, unspecified: Secondary | ICD-10-CM | POA: Diagnosis not present

## 2022-04-24 DIAGNOSIS — I679 Cerebrovascular disease, unspecified: Secondary | ICD-10-CM | POA: Diagnosis not present

## 2022-04-24 DIAGNOSIS — E559 Vitamin D deficiency, unspecified: Secondary | ICD-10-CM | POA: Diagnosis not present

## 2022-04-24 DIAGNOSIS — M810 Age-related osteoporosis without current pathological fracture: Secondary | ICD-10-CM | POA: Diagnosis not present

## 2022-04-24 DIAGNOSIS — R1310 Dysphagia, unspecified: Secondary | ICD-10-CM | POA: Diagnosis not present

## 2022-04-25 DIAGNOSIS — M810 Age-related osteoporosis without current pathological fracture: Secondary | ICD-10-CM | POA: Diagnosis not present

## 2022-04-25 DIAGNOSIS — E559 Vitamin D deficiency, unspecified: Secondary | ICD-10-CM | POA: Diagnosis not present

## 2022-04-25 DIAGNOSIS — R1310 Dysphagia, unspecified: Secondary | ICD-10-CM | POA: Diagnosis not present

## 2022-04-25 DIAGNOSIS — I679 Cerebrovascular disease, unspecified: Secondary | ICD-10-CM | POA: Diagnosis not present

## 2022-04-27 DIAGNOSIS — I679 Cerebrovascular disease, unspecified: Secondary | ICD-10-CM | POA: Diagnosis not present

## 2022-04-27 DIAGNOSIS — E559 Vitamin D deficiency, unspecified: Secondary | ICD-10-CM | POA: Diagnosis not present

## 2022-04-27 DIAGNOSIS — M810 Age-related osteoporosis without current pathological fracture: Secondary | ICD-10-CM | POA: Diagnosis not present

## 2022-04-27 DIAGNOSIS — R1310 Dysphagia, unspecified: Secondary | ICD-10-CM | POA: Diagnosis not present

## 2022-04-28 DIAGNOSIS — R1310 Dysphagia, unspecified: Secondary | ICD-10-CM | POA: Diagnosis not present

## 2022-04-28 DIAGNOSIS — E559 Vitamin D deficiency, unspecified: Secondary | ICD-10-CM | POA: Diagnosis not present

## 2022-04-28 DIAGNOSIS — I679 Cerebrovascular disease, unspecified: Secondary | ICD-10-CM | POA: Diagnosis not present

## 2022-04-28 DIAGNOSIS — M810 Age-related osteoporosis without current pathological fracture: Secondary | ICD-10-CM | POA: Diagnosis not present

## 2022-04-29 DIAGNOSIS — F4323 Adjustment disorder with mixed anxiety and depressed mood: Secondary | ICD-10-CM | POA: Diagnosis not present

## 2022-04-29 DIAGNOSIS — G4700 Insomnia, unspecified: Secondary | ICD-10-CM | POA: Diagnosis not present

## 2022-05-02 DIAGNOSIS — E559 Vitamin D deficiency, unspecified: Secondary | ICD-10-CM | POA: Diagnosis not present

## 2022-05-02 DIAGNOSIS — R1310 Dysphagia, unspecified: Secondary | ICD-10-CM | POA: Diagnosis not present

## 2022-05-02 DIAGNOSIS — M810 Age-related osteoporosis without current pathological fracture: Secondary | ICD-10-CM | POA: Diagnosis not present

## 2022-05-02 DIAGNOSIS — I679 Cerebrovascular disease, unspecified: Secondary | ICD-10-CM | POA: Diagnosis not present

## 2022-05-04 DIAGNOSIS — I679 Cerebrovascular disease, unspecified: Secondary | ICD-10-CM | POA: Diagnosis not present

## 2022-05-04 DIAGNOSIS — E559 Vitamin D deficiency, unspecified: Secondary | ICD-10-CM | POA: Diagnosis not present

## 2022-05-04 DIAGNOSIS — R1310 Dysphagia, unspecified: Secondary | ICD-10-CM | POA: Diagnosis not present

## 2022-05-04 DIAGNOSIS — M810 Age-related osteoporosis without current pathological fracture: Secondary | ICD-10-CM | POA: Diagnosis not present

## 2022-05-06 DIAGNOSIS — M810 Age-related osteoporosis without current pathological fracture: Secondary | ICD-10-CM | POA: Diagnosis not present

## 2022-05-06 DIAGNOSIS — E559 Vitamin D deficiency, unspecified: Secondary | ICD-10-CM | POA: Diagnosis not present

## 2022-05-06 DIAGNOSIS — I679 Cerebrovascular disease, unspecified: Secondary | ICD-10-CM | POA: Diagnosis not present

## 2022-05-06 DIAGNOSIS — R1310 Dysphagia, unspecified: Secondary | ICD-10-CM | POA: Diagnosis not present

## 2022-05-06 DIAGNOSIS — F4322 Adjustment disorder with anxiety: Secondary | ICD-10-CM | POA: Diagnosis not present

## 2022-05-08 DIAGNOSIS — M810 Age-related osteoporosis without current pathological fracture: Secondary | ICD-10-CM | POA: Diagnosis not present

## 2022-05-08 DIAGNOSIS — R1310 Dysphagia, unspecified: Secondary | ICD-10-CM | POA: Diagnosis not present

## 2022-05-08 DIAGNOSIS — I679 Cerebrovascular disease, unspecified: Secondary | ICD-10-CM | POA: Diagnosis not present

## 2022-05-08 DIAGNOSIS — E559 Vitamin D deficiency, unspecified: Secondary | ICD-10-CM | POA: Diagnosis not present

## 2022-05-10 DIAGNOSIS — M810 Age-related osteoporosis without current pathological fracture: Secondary | ICD-10-CM | POA: Diagnosis not present

## 2022-05-10 DIAGNOSIS — R1310 Dysphagia, unspecified: Secondary | ICD-10-CM | POA: Diagnosis not present

## 2022-05-10 DIAGNOSIS — I679 Cerebrovascular disease, unspecified: Secondary | ICD-10-CM | POA: Diagnosis not present

## 2022-05-10 DIAGNOSIS — E559 Vitamin D deficiency, unspecified: Secondary | ICD-10-CM | POA: Diagnosis not present

## 2022-05-13 DIAGNOSIS — E559 Vitamin D deficiency, unspecified: Secondary | ICD-10-CM | POA: Diagnosis not present

## 2022-05-13 DIAGNOSIS — R1310 Dysphagia, unspecified: Secondary | ICD-10-CM | POA: Diagnosis not present

## 2022-05-13 DIAGNOSIS — I679 Cerebrovascular disease, unspecified: Secondary | ICD-10-CM | POA: Diagnosis not present

## 2022-05-13 DIAGNOSIS — M810 Age-related osteoporosis without current pathological fracture: Secondary | ICD-10-CM | POA: Diagnosis not present

## 2022-05-15 DIAGNOSIS — H401133 Primary open-angle glaucoma, bilateral, severe stage: Secondary | ICD-10-CM | POA: Diagnosis not present

## 2022-05-15 DIAGNOSIS — R1310 Dysphagia, unspecified: Secondary | ICD-10-CM | POA: Diagnosis not present

## 2022-05-15 DIAGNOSIS — E559 Vitamin D deficiency, unspecified: Secondary | ICD-10-CM | POA: Diagnosis not present

## 2022-05-15 DIAGNOSIS — I679 Cerebrovascular disease, unspecified: Secondary | ICD-10-CM | POA: Diagnosis not present

## 2022-05-15 DIAGNOSIS — M810 Age-related osteoporosis without current pathological fracture: Secondary | ICD-10-CM | POA: Diagnosis not present

## 2022-06-03 DIAGNOSIS — F4322 Adjustment disorder with anxiety: Secondary | ICD-10-CM | POA: Diagnosis not present

## 2022-06-03 DIAGNOSIS — G4700 Insomnia, unspecified: Secondary | ICD-10-CM | POA: Diagnosis not present

## 2022-06-03 DIAGNOSIS — F4323 Adjustment disorder with mixed anxiety and depressed mood: Secondary | ICD-10-CM | POA: Diagnosis not present

## 2022-06-12 DIAGNOSIS — H401133 Primary open-angle glaucoma, bilateral, severe stage: Secondary | ICD-10-CM | POA: Diagnosis not present

## 2022-06-21 DIAGNOSIS — I679 Cerebrovascular disease, unspecified: Secondary | ICD-10-CM | POA: Diagnosis not present

## 2022-06-22 ENCOUNTER — Inpatient Hospital Stay (HOSPITAL_COMMUNITY)
Admission: EM | Admit: 2022-06-22 | Discharge: 2022-06-25 | DRG: 291 | Disposition: A | Discharge status: Skilled Nursing Facility | Payer: Medicare Other | Source: Skilled Nursing Facility | Attending: Internal Medicine | Admitting: Internal Medicine

## 2022-06-22 ENCOUNTER — Emergency Department (HOSPITAL_COMMUNITY): Payer: Medicare Other

## 2022-06-22 ENCOUNTER — Other Ambulatory Visit: Payer: Self-pay

## 2022-06-22 ENCOUNTER — Observation Stay (HOSPITAL_COMMUNITY): Payer: Medicare Other

## 2022-06-22 DIAGNOSIS — E739 Lactose intolerance, unspecified: Secondary | ICD-10-CM | POA: Diagnosis present

## 2022-06-22 DIAGNOSIS — T782XXA Anaphylactic shock, unspecified, initial encounter: Secondary | ICD-10-CM | POA: Diagnosis not present

## 2022-06-22 DIAGNOSIS — Z7409 Other reduced mobility: Secondary | ICD-10-CM | POA: Diagnosis not present

## 2022-06-22 DIAGNOSIS — R2681 Unsteadiness on feet: Secondary | ICD-10-CM | POA: Diagnosis not present

## 2022-06-22 DIAGNOSIS — Z882 Allergy status to sulfonamides status: Secondary | ICD-10-CM

## 2022-06-22 DIAGNOSIS — I509 Heart failure, unspecified: Secondary | ICD-10-CM | POA: Diagnosis not present

## 2022-06-22 DIAGNOSIS — J9601 Acute respiratory failure with hypoxia: Secondary | ICD-10-CM | POA: Diagnosis present

## 2022-06-22 DIAGNOSIS — E785 Hyperlipidemia, unspecified: Secondary | ICD-10-CM | POA: Diagnosis present

## 2022-06-22 DIAGNOSIS — Z79899 Other long term (current) drug therapy: Secondary | ICD-10-CM | POA: Diagnosis not present

## 2022-06-22 DIAGNOSIS — J9811 Atelectasis: Secondary | ICD-10-CM | POA: Diagnosis not present

## 2022-06-22 DIAGNOSIS — K5900 Constipation, unspecified: Secondary | ICD-10-CM | POA: Diagnosis not present

## 2022-06-22 DIAGNOSIS — N390 Urinary tract infection, site not specified: Secondary | ICD-10-CM | POA: Diagnosis present

## 2022-06-22 DIAGNOSIS — R278 Other lack of coordination: Secondary | ICD-10-CM | POA: Diagnosis not present

## 2022-06-22 DIAGNOSIS — R0689 Other abnormalities of breathing: Secondary | ICD-10-CM | POA: Diagnosis not present

## 2022-06-22 DIAGNOSIS — N179 Acute kidney failure, unspecified: Secondary | ICD-10-CM | POA: Diagnosis present

## 2022-06-22 DIAGNOSIS — Z66 Do not resuscitate: Secondary | ICD-10-CM | POA: Diagnosis present

## 2022-06-22 DIAGNOSIS — G4700 Insomnia, unspecified: Secondary | ICD-10-CM | POA: Diagnosis not present

## 2022-06-22 DIAGNOSIS — I3139 Other pericardial effusion (noninflammatory): Secondary | ICD-10-CM | POA: Diagnosis present

## 2022-06-22 DIAGNOSIS — E0520 Thyrotoxicosis with toxic multinodular goiter without thyrotoxic crisis or storm: Secondary | ICD-10-CM | POA: Diagnosis present

## 2022-06-22 DIAGNOSIS — R001 Bradycardia, unspecified: Secondary | ICD-10-CM | POA: Diagnosis present

## 2022-06-22 DIAGNOSIS — E538 Deficiency of other specified B group vitamins: Secondary | ICD-10-CM | POA: Diagnosis present

## 2022-06-22 DIAGNOSIS — H10402 Unspecified chronic conjunctivitis, left eye: Secondary | ICD-10-CM | POA: Diagnosis not present

## 2022-06-22 DIAGNOSIS — I350 Nonrheumatic aortic (valve) stenosis: Secondary | ICD-10-CM | POA: Diagnosis not present

## 2022-06-22 DIAGNOSIS — D539 Nutritional anemia, unspecified: Secondary | ICD-10-CM | POA: Diagnosis present

## 2022-06-22 DIAGNOSIS — J9600 Acute respiratory failure, unspecified whether with hypoxia or hypercapnia: Secondary | ICD-10-CM | POA: Diagnosis not present

## 2022-06-22 DIAGNOSIS — R1310 Dysphagia, unspecified: Secondary | ICD-10-CM | POA: Diagnosis not present

## 2022-06-22 DIAGNOSIS — I10 Essential (primary) hypertension: Secondary | ICD-10-CM | POA: Diagnosis not present

## 2022-06-22 DIAGNOSIS — H409 Unspecified glaucoma: Secondary | ICD-10-CM | POA: Diagnosis present

## 2022-06-22 DIAGNOSIS — J309 Allergic rhinitis, unspecified: Secondary | ICD-10-CM | POA: Diagnosis not present

## 2022-06-22 DIAGNOSIS — Z888 Allergy status to other drugs, medicaments and biological substances status: Secondary | ICD-10-CM

## 2022-06-22 DIAGNOSIS — D638 Anemia in other chronic diseases classified elsewhere: Secondary | ICD-10-CM | POA: Diagnosis not present

## 2022-06-22 DIAGNOSIS — D631 Anemia in chronic kidney disease: Secondary | ICD-10-CM | POA: Diagnosis present

## 2022-06-22 DIAGNOSIS — R410 Disorientation, unspecified: Secondary | ICD-10-CM | POA: Diagnosis not present

## 2022-06-22 DIAGNOSIS — L89151 Pressure ulcer of sacral region, stage 1: Secondary | ICD-10-CM | POA: Diagnosis present

## 2022-06-22 DIAGNOSIS — Z1152 Encounter for screening for COVID-19: Secondary | ICD-10-CM

## 2022-06-22 DIAGNOSIS — I5033 Acute on chronic diastolic (congestive) heart failure: Secondary | ICD-10-CM | POA: Diagnosis present

## 2022-06-22 DIAGNOSIS — R9431 Abnormal electrocardiogram [ECG] [EKG]: Secondary | ICD-10-CM | POA: Diagnosis not present

## 2022-06-22 DIAGNOSIS — I110 Hypertensive heart disease with heart failure: Secondary | ICD-10-CM | POA: Diagnosis not present

## 2022-06-22 DIAGNOSIS — Z8249 Family history of ischemic heart disease and other diseases of the circulatory system: Secondary | ICD-10-CM

## 2022-06-22 DIAGNOSIS — Z8673 Personal history of transient ischemic attack (TIA), and cerebral infarction without residual deficits: Secondary | ICD-10-CM | POA: Diagnosis not present

## 2022-06-22 DIAGNOSIS — R059 Cough, unspecified: Secondary | ICD-10-CM | POA: Diagnosis not present

## 2022-06-22 DIAGNOSIS — I440 Atrioventricular block, first degree: Secondary | ICD-10-CM | POA: Diagnosis present

## 2022-06-22 DIAGNOSIS — I130 Hypertensive heart and chronic kidney disease with heart failure and stage 1 through stage 4 chronic kidney disease, or unspecified chronic kidney disease: Principal | ICD-10-CM | POA: Diagnosis present

## 2022-06-22 DIAGNOSIS — Z885 Allergy status to narcotic agent status: Secondary | ICD-10-CM

## 2022-06-22 DIAGNOSIS — R0902 Hypoxemia: Secondary | ICD-10-CM | POA: Diagnosis not present

## 2022-06-22 DIAGNOSIS — E559 Vitamin D deficiency, unspecified: Secondary | ICD-10-CM | POA: Diagnosis not present

## 2022-06-22 DIAGNOSIS — T7840XA Allergy, unspecified, initial encounter: Secondary | ICD-10-CM | POA: Diagnosis not present

## 2022-06-22 DIAGNOSIS — N1832 Chronic kidney disease, stage 3b: Secondary | ICD-10-CM | POA: Diagnosis present

## 2022-06-22 DIAGNOSIS — I5031 Acute diastolic (congestive) heart failure: Secondary | ICD-10-CM | POA: Diagnosis not present

## 2022-06-22 DIAGNOSIS — G9349 Other encephalopathy: Secondary | ICD-10-CM | POA: Diagnosis present

## 2022-06-22 DIAGNOSIS — M6281 Muscle weakness (generalized): Secondary | ICD-10-CM | POA: Diagnosis not present

## 2022-06-22 DIAGNOSIS — G629 Polyneuropathy, unspecified: Secondary | ICD-10-CM | POA: Diagnosis present

## 2022-06-22 DIAGNOSIS — Z7401 Bed confinement status: Secondary | ICD-10-CM | POA: Diagnosis not present

## 2022-06-22 DIAGNOSIS — I679 Cerebrovascular disease, unspecified: Secondary | ICD-10-CM | POA: Diagnosis not present

## 2022-06-22 DIAGNOSIS — Z789 Other specified health status: Secondary | ICD-10-CM | POA: Diagnosis not present

## 2022-06-22 DIAGNOSIS — Z87440 Personal history of urinary (tract) infections: Secondary | ICD-10-CM | POA: Diagnosis not present

## 2022-06-22 DIAGNOSIS — E039 Hypothyroidism, unspecified: Secondary | ICD-10-CM | POA: Diagnosis present

## 2022-06-22 DIAGNOSIS — I480 Paroxysmal atrial fibrillation: Secondary | ICD-10-CM | POA: Diagnosis present

## 2022-06-22 DIAGNOSIS — I4891 Unspecified atrial fibrillation: Secondary | ICD-10-CM | POA: Diagnosis not present

## 2022-06-22 DIAGNOSIS — I420 Dilated cardiomyopathy: Secondary | ICD-10-CM | POA: Diagnosis present

## 2022-06-22 DIAGNOSIS — G9340 Encephalopathy, unspecified: Secondary | ICD-10-CM | POA: Diagnosis not present

## 2022-06-22 DIAGNOSIS — K219 Gastro-esophageal reflux disease without esophagitis: Secondary | ICD-10-CM | POA: Diagnosis not present

## 2022-06-22 DIAGNOSIS — R0602 Shortness of breath: Secondary | ICD-10-CM | POA: Diagnosis not present

## 2022-06-22 DIAGNOSIS — Z886 Allergy status to analgesic agent status: Secondary | ICD-10-CM

## 2022-06-22 DIAGNOSIS — E7800 Pure hypercholesterolemia, unspecified: Secondary | ICD-10-CM | POA: Diagnosis not present

## 2022-06-22 DIAGNOSIS — M810 Age-related osteoporosis without current pathological fracture: Secondary | ICD-10-CM | POA: Diagnosis not present

## 2022-06-22 DIAGNOSIS — I700 Atherosclerosis of aorta: Secondary | ICD-10-CM | POA: Diagnosis not present

## 2022-06-22 DIAGNOSIS — J90 Pleural effusion, not elsewhere classified: Secondary | ICD-10-CM | POA: Diagnosis not present

## 2022-06-22 LAB — CBC
HCT: 33.8 % — ABNORMAL LOW (ref 36.0–46.0)
Hemoglobin: 10 g/dL — ABNORMAL LOW (ref 12.0–15.0)
MCH: 29.7 pg (ref 26.0–34.0)
MCHC: 29.6 g/dL — ABNORMAL LOW (ref 30.0–36.0)
MCV: 100.3 fL — ABNORMAL HIGH (ref 80.0–100.0)
Platelets: 250 K/uL (ref 150–400)
RBC: 3.37 MIL/uL — ABNORMAL LOW (ref 3.87–5.11)
RDW: 15.7 % — ABNORMAL HIGH (ref 11.5–15.5)
WBC: 6.1 K/uL (ref 4.0–10.5)
nRBC: 0 % (ref 0.0–0.2)

## 2022-06-22 LAB — COMPREHENSIVE METABOLIC PANEL
ALT: 24 U/L (ref 0–44)
AST: 14 U/L — ABNORMAL LOW (ref 15–41)
Albumin: 3.6 g/dL (ref 3.5–5.0)
Alkaline Phosphatase: 75 U/L (ref 38–126)
Anion gap: 9 (ref 5–15)
BUN: 39 mg/dL — ABNORMAL HIGH (ref 8–23)
CO2: 23 mmol/L (ref 22–32)
Calcium: 8.9 mg/dL (ref 8.9–10.3)
Chloride: 103 mmol/L (ref 98–111)
Creatinine, Ser: 1.43 mg/dL — ABNORMAL HIGH (ref 0.44–1.00)
GFR, Estimated: 34 mL/min — ABNORMAL LOW (ref >60)
Glucose, Bld: 106 mg/dL — ABNORMAL HIGH (ref 70–99)
Potassium: 4.5 mmol/L (ref 3.5–5.1)
Sodium: 135 mmol/L (ref 135–145)
Total Bilirubin: 0.6 mg/dL (ref 0.3–1.2)
Total Protein: 7.2 g/dL (ref 6.5–8.1)

## 2022-06-22 LAB — I-STAT VENOUS BLOOD GAS, ED
Acid-base deficit: 3 mmol/L — ABNORMAL HIGH (ref 0.0–2.0)
Bicarbonate: 23.7 mmol/L (ref 20.0–28.0)
Calcium, Ion: 1.2 mmol/L (ref 1.15–1.40)
HCT: 30 % — ABNORMAL LOW (ref 36.0–46.0)
Hemoglobin: 10.2 g/dL — ABNORMAL LOW (ref 12.0–15.0)
O2 Saturation: 99 %
Potassium: 4.8 mmol/L (ref 3.5–5.1)
Sodium: 136 mmol/L (ref 135–145)
TCO2: 25 mmol/L (ref 22–32)
pCO2, Ven: 51.7 mmHg (ref 44–60)
pH, Ven: 7.27 (ref 7.25–7.43)
pO2, Ven: 161 mmHg — ABNORMAL HIGH (ref 32–45)

## 2022-06-22 LAB — BRAIN NATRIURETIC PEPTIDE: B Natriuretic Peptide: 924.9 pg/mL — ABNORMAL HIGH (ref 0.0–100.0)

## 2022-06-22 LAB — RESP PANEL BY RT-PCR (RSV, FLU A&B, COVID)  RVPGX2
Influenza A by PCR: NEGATIVE
Influenza B by PCR: NEGATIVE
Resp Syncytial Virus by PCR: NEGATIVE
SARS Coronavirus 2 by RT PCR: NEGATIVE

## 2022-06-22 MED ORDER — ENOXAPARIN SODIUM 30 MG/0.3ML IJ SOSY
30.0000 mg | PREFILLED_SYRINGE | INTRAMUSCULAR | Status: DC
  Administered 2022-06-22 – 2022-06-24 (×3): 30 mg via SUBCUTANEOUS
  Filled 2022-06-22 (×3): qty 0.3

## 2022-06-22 MED ORDER — DORZOLAMIDE HCL-TIMOLOL MAL 2-0.5 % OP SOLN
1.0000 [drp] | Freq: Two times a day (BID) | OPHTHALMIC | Status: DC
  Administered 2022-06-22 – 2022-06-25 (×6): 1 [drp] via OPHTHALMIC
  Filled 2022-06-22: qty 10

## 2022-06-22 MED ORDER — POLYETHYLENE GLYCOL 3350 17 G PO PACK: 17.0000 g | PACK | Freq: Every day | ORAL | Status: DC | PRN

## 2022-06-22 MED ORDER — FUROSEMIDE 10 MG/ML IJ SOLN
20.0000 mg | Freq: Once | INTRAMUSCULAR | Status: AC
  Administered 2022-06-22: 20 mg via INTRAVENOUS
  Filled 2022-06-22: qty 2

## 2022-06-22 MED ORDER — ACETAMINOPHEN 650 MG RE SUPP: 650.0000 mg | Freq: Four times a day (QID) | RECTAL | Status: DC | PRN

## 2022-06-22 MED ORDER — VITAMIN B-12 1000 MCG PO TABS
1000.0000 ug | ORAL_TABLET | Freq: Every day | ORAL | Status: DC
  Administered 2022-06-23 – 2022-06-25 (×3): 1000 ug via ORAL
  Filled 2022-06-22 (×3): qty 1

## 2022-06-22 MED ORDER — ACETAMINOPHEN 325 MG PO TABS: 650.0000 mg | ORAL_TABLET | Freq: Four times a day (QID) | ORAL | Status: DC | PRN

## 2022-06-22 MED ORDER — ATENOLOL 50 MG PO TABS
25.0000 mg | ORAL_TABLET | Freq: Every day | ORAL | Status: DC
  Administered 2022-06-23 – 2022-06-25 (×3): 25 mg via ORAL
  Filled 2022-06-22 (×3): qty 1

## 2022-06-22 NOTE — ED Notes (Signed)
ED TO INPATIENT HANDOFF REPORT  ED Nurse Name and Phone #: Gillis Ends [phone]  S Name/Age/Gender Cindy Smith 89 y.o. female Room/Bed: 040C/040C  Code Status   Code Status: DNR  Home/SNF/Other Skilled nursing facility Patient oriented to: self Is this baseline? Yes   Triage Complete: Triage complete  Chief Complaint Acute hypoxic respiratory failure (HCC) [J96.01]  Triage Note Pt BIB [facility] from [facility] nursing facility d/t SOB and some confusion that started at 2. Had xray yesterday which was clear. Pt is Afib w no prior hx. Pt is on 2L at [place] . 2 nebs given by EMS whic improved pt's sat to 97%. . Pt was sating at 85% on 2L.   BP 150/80 HR 50-80   Allergies Allergies  Allergen Reactions   Asa [Aspirin] Other (See Comments)    Capillary bleeds   Codeine Other (See Comments)    "I just pass out"   Lactose Intolerance (Gi) Diarrhea and Other (See Comments)    Because of chemo, some loose stools occur   Lipitor [Atorvastatin] Other (See Comments)    "Made my muscles feel like jelly"   Lotemax [Loteprednol] Other (See Comments)    Vertigo   Prednisone Other (See Comments)    "Steroids cause dizziness and nerve problems"   Sulfamethoxazole-Trimethoprim Rash    Level of Care/Admitting Diagnosis ED Disposition     ED Disposition  Admit   Condition  --   Comment  Hospital Area: [facility] [[id number]]  Level of Care: Telemetry Medical [104]  May place patient in observation at [facility] or [facility] if equivalent level of care is available:: No  Covid Evaluation: Asymptomatic - no recent exposure (last 10 days) testing not required  Diagnosis: Acute hypoxic respiratory failure [facility]) [[id number]]  Admitting Physician: Dickie La [[id number]]  Attending Physician: Dickie La [[id number]]          B Medical/Surgery History Past Medical History:  Diagnosis Date   HTN (hypertension)    Hyperthyroidism    Past Surgical History:  Procedure  Laterality Date   COLECTOMY       A IV Location/Drains/Wounds Patient Lines/Drains/Airways Status     Active Line/Drains/Airways     Name Placement date Placement time Site Days   Peripheral IV 06/22/22 20 G Right Antecubital 06/22/22  2144  Antecubital  less than 1   External Urinary Catheter 08/02/20  0315  --  689   Pressure Injury 11/19/19 Coccyx Bilateral Stage 1 -  Intact skin with non-blanchable redness of a localized area usually over a bony prominence. Non blanchable,red flaky skin, bilaterally 11/19/19  1456  -- 946   Wound / Incision (Open or Dehisced) 11/19/19 (MASD) Moisture Associated Skin Damage Buttocks Right;Left redness 11/19/19  1045  Buttocks  946            Intake/Output Last 24 hours No intake or output data in the 24 hours ending 06/22/22 2344  Labs/Imaging Results for orders placed or performed during the hospital encounter of 06/22/22 (from the past 48 hour(s))  Resp panel by RT-PCR (RSV, Flu A&B, Covid) Anterior Nasal Swab     Status: None   Collection Time: 06/22/22  5:41 PM   Specimen: Anterior Nasal Swab  Result Value Ref Range   SARS Coronavirus 2 by RT PCR NEGATIVE NEGATIVE   Influenza A by PCR NEGATIVE NEGATIVE   Influenza B by PCR NEGATIVE NEGATIVE    Comment: (NOTE) The Xpert Xpress SARS-CoV-2/FLU/RSV plus assay is intended as  an aid in the diagnosis of influenza from Nasopharyngeal swab specimens and should not be used as a sole basis for treatment. Nasal washings and aspirates are unacceptable for Xpert Xpress SARS-CoV-2/FLU/RSV testing.  Fact Sheet for Patients: BloggerCourse.com  Fact Sheet for Healthcare Providers: SeriousBroker.it  This test is not yet approved or cleared by the [place] FDA and has been authorized for detection and/or diagnosis of SARS-CoV-2 by FDA under an Emergency Use Authorization (EUA). This EUA will remain in effect (meaning this test can be used) for  the duration of the COVID-19 declaration under Section 564(b)(1) of the Act, 21 U.S.C. section 360bbb-3(b)(1), unless the authorization is terminated or revoked.     Resp Syncytial Virus by PCR NEGATIVE NEGATIVE    Comment: (NOTE) Fact Sheet for Patients: BloggerCourse.com  Fact Sheet for Healthcare Providers: SeriousBroker.it  This test is not yet approved or cleared by the [place] FDA and has been authorized for detection and/or diagnosis of SARS-CoV-2 by FDA under an Emergency Use Authorization (EUA). This EUA will remain in effect (meaning this test can be used) for the duration of the COVID-19 declaration under Section 564(b)(1) of the Act, 21 U.S.C. section 360bbb-3(b)(1), unless the authorization is terminated or revoked.  Performed at [facility], [address], [place], [place] [address]   Comprehensive metabolic panel     Status: Abnormal   Collection Time: 06/22/22  5:41 PM  Result Value Ref Range   Sodium 135 135 - 145 mmol/L   Potassium 4.5 3.5 - 5.1 mmol/L   Chloride 103 98 - 111 mmol/L   CO2 23 22 - 32 mmol/L   Glucose, Bld 106 (H) 70 - 99 mg/dL    Comment: Glucose reference range applies only to samples taken after fasting for at least 8 hours.   BUN 39 (H) 8 - 23 mg/dL   Creatinine, Ser 6.04 (H) 0.44 - 1.00 mg/dL   Calcium 8.9 8.9 - 54.0 mg/dL   Total Protein 7.2 6.5 - 8.1 g/dL   Albumin 3.6 3.5 - 5.0 g/dL   AST 14 (L) 15 - 41 U/L   ALT 24 0 - 44 U/L   Alkaline Phosphatase 75 38 - 126 U/L   Total Bilirubin 0.6 0.3 - 1.2 mg/dL   GFR, Estimated 34 (L) >60 mL/min    Comment: (NOTE) Calculated using the CKD-EPI Creatinine Equation (2021)    Anion gap 9 5 - 15    Comment: Performed at [facility], [address], [place], [place] [address]  CBC     Status: Abnormal   Collection Time: 06/22/22  5:41 PM  Result Value Ref Range   WBC 6.1 4.0 - 10.5 K/uL   RBC 3.37 (L) 3.87 - 5.11 MIL/uL    Hemoglobin 10.0 (L) 12.0 - 15.0 g/dL   HCT 14.7 (L) 82.9 - 56.2 %   MCV 100.3 (H) 80.0 - 100.0 fL   MCH 29.7 26.0 - 34.0 pg   MCHC 29.6 (L) 30.0 - 36.0 g/dL   RDW 13.0 (H) 86.5 - 78.4 %   Platelets 250 150 - 400 K/uL   nRBC 0.0 0.0 - 0.2 %    Comment: Performed at [facility], [address], [place], [place] [address]  Brain natriuretic peptide     Status: Abnormal   Collection Time: 06/22/22  5:41 PM  Result Value Ref Range   B Natriuretic Peptide 924.9 (H) 0.0 - 100.0 pg/mL    Comment: Performed at  [facility], [address], [place], [place] [address]  I-Stat venous blood gas, ED     Status: Abnormal   Collection Time: 06/22/22  6:49 PM  Result Value Ref Range   pH, Ven 7.270 7.25 - 7.43   pCO2, Ven 51.7 44 - 60 mmHg   pO2, Ven 161 (H) 32 - 45 mmHg   Bicarbonate 23.7 20.0 - 28.0 mmol/L   TCO2 25 22 - 32 mmol/L   O2 Saturation 99 %   Acid-base deficit 3.0 (H) 0.0 - 2.0 mmol/L   Sodium 136 135 - 145 mmol/L   Potassium 4.8 3.5 - 5.1 mmol/L   Calcium, Ion 1.20 1.15 - 1.40 mmol/L   HCT 30.0 (L) 36.0 - 46.0 %   Hemoglobin 10.2 (L) 12.0 - 15.0 g/dL   Sample type VENOUS    US RENAL  Result Date: 06/22/2022 CLINICAL DATA:  Acute kidney injury. EXAM: RENAL / URINARY TRACT ULTRASOUND COMPLETE COMPARISON:  Report of ultrasound complete abdomen 05/04/2002 with no significant renal findings bilaterally. Report of CT abdomen and pelvis with contrast 07/21/2001, stating the kidneys there are both unremarkable. Images for the studies are no longer available in PACS. FINDINGS: Right Kidney: Unable to be visualized. Left Kidney: Renal measurements: Small in size measuring 5.8 x 2.9 x 2.9 cm = volume: 25.1 mL. Echogenicity within normal limits despite the small overall kidney size, but with somewhat thinned cortex. No mass, stones or hydronephrosis visualized. Bladder: Not seen due to contraction. Other: No free fluid is seen, but there is a moderate-sized right pleural effusion.  IMPRESSION: 1. Nonvisualization of the right kidney. 2. The left kidney is small in size with somewhat thin cortex, but has a normal echogenicity. 3. Moderate-sized right pleural effusion. Electronically Signed   By: Almira Bar M.D.   On: 06/22/2022 22:03   DG Chest Port 1 View  Result Date: 06/22/2022 CLINICAL DATA:  Shortness of breath and cough EXAM: PORTABLE CHEST 1 VIEW COMPARISON:  Radiograph 10/26/2002 report FINDINGS: Cardiomegaly. Aortic atherosclerotic calcification. Hazy bilateral airspace and interstitial opacities greatest in the lower lungs. Small bilateral pleural effusions. No pneumothorax. IMPRESSION: 1. Cardiomegaly with hazy bilateral airspace and interstitial opacities greatest in the lower lungs, favor edema though pneumonia is not excluded. 2. Small bilateral pleural effusions. Electronically Signed   By: Minerva Fester M.D.   On: 06/22/2022 18:48    Pending Labs Unresulted Labs (From admission, onward)     Start     Ordered   06/23/22 0500  CBC  Tomorrow morning,   R        06/22/22 2021   06/23/22 0500  Basic metabolic panel  Tomorrow morning,   R        06/22/22 2021   06/22/22 2025  Vitamin B12  Once,   R        06/22/22 2024   06/22/22 2021  TSH  Once,   R        06/22/22 2021   06/22/22 2021  Procalcitonin  Once,   R       References:    Procalcitonin Lower Respiratory Tract Infection AND Sepsis Procalcitonin Algorithm   06/22/22 2021   06/22/22 2021  Urinalysis, Routine w reflex microscopic -Urine, Clean Catch  Once,   R       Question:  Specimen Source  Answer:  Urine, Clean Catch   06/22/22 2021            Vitals/Pain Today's Vitals  06/22/22 1942 06/22/22 2030 06/22/22 2245 06/22/22 2249  BP:  136/87 133/60   Pulse:  (!) 58 (!) 53   Resp:  (!) 21 20   Temp:    97.9 F (36.6 C)  TempSrc:    Oral  SpO2:  93% 98%   PainSc: Asleep       Isolation Precautions No active isolations  Medications Medications  atenolol (TENORMIN) tablet 25  mg (has no administration in time range)  cyanocobalamin (VITAMIN B12) tablet 1,000 mcg (has no administration in time range)  dorzolamide-timolol (COSOPT) 2-0.5 % ophthalmic solution 1 drop (1 drop Both Eyes Given 06/22/22 2151)  enoxaparin (LOVENOX) injection 30 mg (30 mg Subcutaneous Given 06/22/22 2147)  acetaminophen (TYLENOL) tablet 650 mg (has no administration in time range)    Or  acetaminophen (TYLENOL) suppository 650 mg (has no administration in time range)  polyethylene glycol (MIRALAX / GLYCOLAX) packet 17 g (has no administration in time range)  furosemide (LASIX) injection 20 mg (20 mg Intravenous Given 06/22/22 2144)    Mobility non-ambulatory     Focused Assessments     R Recommendations: See Admitting Provider Note  Report given to:   Additional Notes:

## 2022-06-22 NOTE — Hospital Course (Addendum)
HPI -AO x person, birthday -Not feeling bad recently -No hot, cold, CP, abd pain, dysuria, N/V, C/D.   Daughter: -Permanently, SNF -No issue with memory -Saw her on the 8th. Was alert. Hearing issue. Not walking (dislocated knee). Not on oxygen.  -Was on Oxygen 2 night before. Was confused. Was able to conversate. -No hx of dementia.  -No hx of atrial fib.  -No blood thinner -Food: reg -Code status: DNR  -No hx smoking, alcohol or substance  Discharge: -From [facility] -Echo from 11/2019 EF 65-70%  ED findings: - A-fib, 2L  -RVP negative -CMP significant for elevated Cr 1.43, BUN 39, GFR 34 -CBC significant for macrocytic anemia w/ Hgb 10, no leukocytosis -VBG pH 7.27, pCO2 51 -CXR w/ cardiomegaly w/ hazy bilateral airspace/interstitial opacities greatest in lower lungs (favor edema though pneumonia not excluded), small bilateral pleural effusions -EKG w/ a-fib, normal rate -Pending BMP  ED interventions:  -   PCP:  Buzzy Han  PMH: hypertension, CKD, TIA, hypothyroidism  - No history of a-fib or CHF  (+) SX: SOB, productive cough, mild wheezing,  mild confusion  -*When was LKN*  (-) SX: -  - *fever, chills, cough, ST, nausea, vomiting, diarrhea, abdominal pain, constipation, chest pain, palpitations, SOB, orthopnea, lower extremity swelling, dysuria, urinary frequency, headache, lightheadedness, dizziness, syncope*  Surgical Hx:  Social Hx:  -*Live with, work, ADLs, ambulates w/, ETOH, tobacco, recreational drugs*  Family Hx: -*MI, stroke, DM, cancer*  Medications: *taking regularly, took today* -Amlodipine 10 mg daily -Atenolol 25 mg dailiy -Brimonidine eye drops -Dorzolamide-Timolol eye drops -Cholecalciferol 1000-2000 units daily -Vitamin B12 1000 mcg daily   Allergies:  Code:  Physical Exam:  (+): (-):  Plan: PT/OT   Acute CHF -Possibly 2/2 a-fib -echo  2. AKI vs CKD -Maybe pre-renal -Urine sodium -Baseline Cr ~  1 -Renal US  3. A-fib - No rate control/anticoagulation - No blood thinner family -TSH, UDS, UA,  4. HTN -Hold home meds (except atenolol)  5. Macrocytic Anemia - Vitamin B12 level

## 2022-06-22 NOTE — ED Triage Notes (Signed)
Pt BIB [facility] from [facility] nursing facility d/t SOB and some confusion that started at 2. Had xray yesterday which was clear. Pt is Afib w no prior hx. Pt is on 2L at [place] . 2 nebs given by EMS whic improved pt's sat to 97%. . Pt was sating at 85% on 2L.   BP 150/80 HR 50-80

## 2022-06-22 NOTE — H&P (Cosign Needed Addendum)
Date: 06/22/2022               Patient Name:  Cindy Smith MRN: [id number]  DOB: 1923-02-07 Age / Sex: 89 y.o., female   PCP: Buzzy Han, NP (Inactive)         Medical Service: Internal Medicine Teaching Service         Attending Physician: Dr. Dickie La, MD    First Contact: Dr. Marjie Skiff Pager: [phone]  Second Contact: Dr. Sloan Leiter Pager: [phone]       After Hours (After 5p/  First Contact Pager: [phone]  weekends / holidays): Second Contact Pager: [phone]   Chief Complaint: Hypoxia   History of Present Illness:  Cindy Smith is a 89 y/o female with past medical history of HTN, HLD, CKD3b, CVA, hypothyroidism, glaucoma, GERD, dysphagia, neuropathy, falls that presents for hypoxia.   Patient presents from her nursing facility [facility] due to concern for hypoxia began 2 nights ago. Upon arrival to the ED, patient required 2 L via [place].  Patient also found to be in A-fib and noted to have altered mentation. According to the ED provider, patient reported shortness of breath and productive cough.    Per the patient's daughter, she lives at [facility] permanently and typically does not have issues with memory. She last saw the patient 10 days ago and states that she was able to have a normal conversation with her then. States that she typically is AxO x 4.  Denies history of dementia or atrial fibrillation.  States that the patient is not on a blood thinner due to concern for bleeding with falls.  Reports that the patient is able to eat regular food.  States that the patient had no prior DNR.   History was limited by patient's altered mentation.  However, she denies feeling sick recently.  She denies chest pain, pain elsewhere, shortness of breath, fever, chills, nausea, vomiting, diarrhea, abdominal pain, dysuria, urinary frequency.  Denies recent falls.   Review of Systems: A complete ROS was negative except as per HPI.   ED Course:  Interventions:  Patient started on supplemental oxygen (2L [place]).   Past Medical History:  Diagnosis Date   HTN (hypertension)    Hyperthyroidism     Meds:  -Amlodipine 10 mg daily -Atenolol 25 mg dailiy -Brimonidine eye drops -Dorzolamide-Timolol eye drops -Cholecalciferol 1000-2000 units daily -Vitamin B12 1000 mcg daily -Florastor 250 mg BID  Allergies: Allergies as of 06/22/2022 - Review Complete 06/22/2022  Allergen Reaction Noted   Asa [aspirin] Other (See Comments) 08/01/2020   Codeine Other (See Comments) 11/17/2019   Lactose intolerance (gi) Diarrhea and Other (See Comments) 08/01/2020   Lipitor [atorvastatin] Other (See Comments) 08/01/2020   Lotemax [loteprednol] Other (See Comments) 08/01/2020   Prednisone Other (See Comments) 08/01/2020   Sulfamethoxazole-trimethoprim Rash 08/01/2020    Surgical History: colectomy  Family History: HTN, HLD  Social History: [facility] lives [facility]. Currently retired. Requires assistance w/ most ADLs. Does not walk at baseline. No current or prior alcohol, tobacco, or recreational drug use.   Physical Exam: Blood pressure (!) 126/55, pulse 76, temperature 98.1 F (36.7 C), temperature source Oral, resp. rate 20, SpO2 99 %. Constitutional: elderly, resting in bed, appears comfortable but mildly confused HENT: Normocephalic and atraumatic. 2x2 cm swelling of the right posterior parietal region that is without erythema, swelling, or active bleeding (likely chronic cyst). No biting of the tongue or erythema/bleeding of the oropharynx.  Eyes:  EOMI. PERRL.  Neck: Normal range of motion.  Cardiovascular: Regular rate, irregularly irregular rhythm. 2/6 systolic murmur heard best over LUSB. No rubs or gallops. Normal radial and PT pulses bilaterally. 1+ pitting edema of bilateral lower extremities.  Pulmonary: Normal respiratory effort. No wheezes. Mild crackles at the lung bases bilaterally.   Abdominal: Soft. Non-distended. No  tenderness. Normal bowel sounds.  Musculoskeletal: Normal range of motion, movement limited by deconditioning. Left lower extremity is externally rotated but not shortened. No tenderness to palpation of the shoulder, elbow, wrist, hip, knee, ankle joints.      Neurological: Alert and oriented to self only. Following commands appropriately. 5/5 strength of all extremities. No loss of sensation.  Skin: warm and dry. Decreased skin turgor. No ulcers of the skin noted. Overgrown toenails on all toes.   DG Chest Port 1 View  Result Date: 06/22/2022 CLINICAL DATA:  Shortness of breath and cough EXAM: PORTABLE CHEST 1 VIEW COMPARISON:  Radiograph 10/26/2002 report FINDINGS: Cardiomegaly. Aortic atherosclerotic calcification. Hazy bilateral airspace and interstitial opacities greatest in the lower lungs. Small bilateral pleural effusions. No pneumothorax. IMPRESSION: 1. Cardiomegaly with hazy bilateral airspace and interstitial opacities greatest in the lower lungs, favor edema though pneumonia is not excluded. 2. Small bilateral pleural effusions. Electronically Signed   By: Minerva Fester M.D.   On: 06/22/2022 18:48     EKG: personally reviewed my interpretation is a-fib with normal rate.   Assessment & Plan by Problem: Principal Problem:   Acute hypoxic respiratory failure (HCC) Active Problems:   AKI (acute kidney injury) (HCC)   Anemia of chronic disease   Essential hypertension   Acute heart failure (HCC)   Atrial fibrillation (HCC)   Acute encephalopathy   Cindy Smith is a 89 y/o female with past medical history of HTN, HLD, CKD3b, CVA, hypothyroidism, glaucoma, GERD, dysphagia, neuropathy, falls that presents for hypoxia and was admitted for acute hypoxic respiratory failure.   #Acute Hypoxic Respiratory Failure The patient has no prior history of asthma, COPD, or CHF.  Most recent echo from 11/2019 w/ EF 65-70%.  She presents with shortness of breath and acute hypoxia requiring 2L   [place].  Low suspicion for pneumonia at this time given lack of fever/leukocytosis. RVP is negative. VBG without signs of CO2 retention. Has cardiomegaly with bilateral lower lung opacities and small pleural effusions on CXR concerning for fluid overload. BNP elevated at 924.  On exam, has mild crackles in the bases and 1+ lower extremity swelling bilaterally.  Suspect her acute hypoxia is likely secondary to volume overload from acute CHF exacerbation.  Suspect that this exacerbation could be secondary to new-onset A-fib.  Will diurese and reassess volume status/oxygen requirements. Will wean oxygen as tolerated.  Will obtain echo to assess cardiac function.  -Supplemental O2 as necessary -IV lasix 20 mg daily -Daily weights -Strict Is/Os -Pending echo  #A-fib Patient has no prior history of atrial fibrillation. Found to have A-fib with normal rate on admission.  The cause of her atrial fibrillation is currently unknown. Patient does have a history of hypothyroidism and TSH is currently elevated at 4.7. Upon chart review, it was noted that patient has history of multinodular goiter and was treated for hyperthyroidism. Unable to find prior medication regimen in her chart. She had CT angio neck in 11/2019 demonstrated enlarged/heterogenous right thyroid lobe w/ recommended thyroid US. No record of thyroid US on file and patient is not currently on pharmacotherapy for hypothyroidism. This is a potential cause of her  a-fib. Will order free T4/T3. Will assess for signs of infection that could explain this new finding. Not currently on anticoagulation at her facility. ChadsVasc score is 6. Family has decided not to anticoagulate due to risk of bleeding with falls. Will not start anticoagulation for this reason.  -Pending UDS, UA, free T4/T3 -Atenolol 25 mg dailiy  #Acute Encephalopathy Per patient's daughter, she is typically AxO x4. On admission, patient is only oriented to self. Suspect her altered mentation  is likely secondary to acute hypoxia. Although, VBG without signs of CO2 retention. Low suspicion for infection at this time given lack of fever/leukocytosis, negative RVP, and procalcitonin WNL.TSH elevated on admission, however, would suspect that patient would have longer duration of altered mentation if due to decreased thyroid function. Will investigate for other causes of her altered mentation.  -Pending UA, UDS  #AKI on CKD3b Baseline Cr ~ 1.  Creatinine 1.43 on admission with GFR of 34. Renal US on admission was unrevealing but was also unable to visualize the right kidney. Worsening renal function may be due to cardiorenal syndrome vs prerenal in the setting of decreased PO intake w/ acutely altered mentation.  Will trend renal function following diuresis and encourage PO intake. -Encourage PO intake -Trend BMP  #HTN Patient taking amlodipine 10 mg daily and atenolol 25 mg daily at her facility.  BP normotensive since admission.  Will continue atenolol and hold amlodipine at this time. Consider restarting amlodipine if patient becomes hypertensive. -Atenolol 25 mg daily  #Macrocytic Anemia Patient has history of vitamin B12 deficiency. Taking vitamin B12 1000 mcg daily at her facility. CBC on admission significant for macrocytic anemia with hemoglobin of 10. No signs of acute blood loss. Vitamin B12 level on the lower side of normal on admission. -Vitamin B12 1000 mcg daily  #Goals of Care Patient is at a high risk for decompensation given her chronic comorbidities.  Will treat the reversible causes of her hypoxia and altered mentation.  Discussed patient's risk with daughter who has decided to make the patient DNR.   Diet:  Regular Bowel: miralax VTE: lovenox IVF: none Code: DNR PT/OT recs: none   Prior to Admission [facility] Ar[facility]ement: [facility] Anticipated Discharge Location: TBD Barriers to Discharge: continued management  Dispo: Admit patient to  Observation with expected length of stay less than 2 midnights.  Signed: Karoline Caldwell, MD 06/22/2022, 8:46 PM  Pager: [phone] After 5pm on weekdays and 1pm on weekends: On Call pager: [phone]

## 2022-06-22 NOTE — ED Provider Notes (Signed)
[place] EMERGENCY DEPARTMENT AT [facility] Provider Note   CSN: [id number] Arrival date & time: 06/22/22  1720     History {Add pertinent medical, surgical, social history, OB history to HPI:1} Chief Complaint  Patient presents with   Shortness of Breath    Cindy Smith is a 89 y.o. female.  89 year old female with a history of hypertension, CKD, TIA, and hypothyroidism presents emergency department with shortness of breath.  2 nights ago patient started having shortness of breath.  Was noted to be hypoxic and was started on nasal cannula 2 L but then became hypoxic to 85% on the 2 L.  Shortness of breath worsened today.  Also has had a productive cough.  No fevers.  No chest pain.  EMS reported some mild wheezing the patient has no history of COPD or tobacco use.  Was given a neb and route.  Was also noted to be in atrial fibrillation and has no history of A-fib.  Not on blood thinners. They also report some mild confusion since then. Comes from [facility].        Home Medications Prior to Admission medications   Medication Sig Start Date End Date Taking? Authorizing Provider  amLODipine (NORVASC) 10 MG tablet Take 1 tablet (10 mg total) by mouth daily. 12/01/19 08/01/20  Love, Evlyn Kanner, PA-C  atenolol (TENORMIN) 25 MG tablet Take 1 tablet (25 mg total) by mouth daily. 12/01/19   Love, Evlyn Kanner, PA-C  brimonidine (ALPHAGAN) 0.2 % ophthalmic solution Place 1 drop into both eyes 3 (three) times daily.    [provider]  cephALEXin (KEFLEX) 250 MG capsule Take 1 capsule (250 mg total) by mouth 4 (four) times daily. 09/17/21   Mancel Bale, MD  Cholecalciferol (VITAMIN D-3) 25 MCG (1000 UT) CAPS Take 1,000-2,000 Units by mouth daily.    [provider]  cyanocobalamin 1000 MCG tablet Take 1 tablet (1,000 mcg total) by mouth daily. 12/01/19   Love, Evlyn Kanner, PA-C  dorzolamide-timolol (COSOPT) 22.3-6.8 MG/ML ophthalmic solution Place 1  drop into both eyes 2 (two) times daily.    [provider]  ROCKLATAN 0.02-0.005 % SOLN Place 1 drop into both eyes at bedtime.    [provider]  saccharomyces boulardii (FLORASTOR) 250 MG capsule Take 1 capsule (250 mg total) by mouth 2 (two) times daily. Patient taking differently: Take 250 mg by mouth daily. 12/01/19   Love, Evlyn Kanner, PA-C      Allergies    Asa [aspirin], Codeine, Lactose intolerance (gi), Lipitor [atorvastatin], Lotemax [loteprednol], Prednisone, and Sulfamethoxazole-trimethoprim    Review of Systems   Review of Systems  Physical Exam Updated Vital Signs There were no vitals taken for this visit. Physical Exam  ED Results / Procedures / Treatments   Labs (all labs ordered are listed, but only abnormal results are displayed) Labs Reviewed  RESP PANEL BY RT-PCR (RSV, FLU A&B, COVID)  RVPGX2  COMPREHENSIVE METABOLIC PANEL  CBC  BRAIN NATRIURETIC PEPTIDE  I-STAT VENOUS BLOOD GAS, ED    EKG EKG Interpretation  Date/Time:  Saturday Jun 22 2022 17:34:57 EDT Ventricular Rate:  76 PR Interval:    QRS Duration: 92 QT Interval:  392 QTC Calculation: 441 R Axis:   69 Text Interpretation: Atrial fibrillation Low voltage, precordial leads Probable anteroseptal infarct, old Confirmed by Vonita Moss [phone] on 06/22/2022 6:03:06 PM  Radiology No results found.  Procedures Procedures  {Document cardiac monitor, telemetry assessment procedure when appropriate:1}  Medications  Ordered in ED Medications - No data to display  ED Course/ Medical Decision Making/ A&P   {   Click here for ABCD2, HEART and other calculatorsREFRESH Note before signing :1}                          Medical Decision Making Amount and/or Complexity of Data Reviewed Labs: ordered. Radiology: ordered.   ***  {Document critical care time when appropriate:1} {Document review of labs and clinical decision tools ie heart score, Chads2Vasc2 etc:1}  {Document  your independent review of radiology images, and any outside records:1} {Document your discussion with family members, caretakers, and with consultants:1} {Document social determinants of health affecting pt's care:1} {Document your decision making why or why not admission, treatments were needed:1} Final Clinical Impression(s) / ED Diagnoses Final diagnoses:  None    Rx / DC Orders ED Discharge Orders     None

## 2022-06-23 ENCOUNTER — Observation Stay (HOSPITAL_COMMUNITY): Payer: Medicare Other

## 2022-06-23 DIAGNOSIS — Z1152 Encounter for screening for COVID-19: Secondary | ICD-10-CM | POA: Diagnosis not present

## 2022-06-23 DIAGNOSIS — I480 Paroxysmal atrial fibrillation: Secondary | ICD-10-CM | POA: Diagnosis present

## 2022-06-23 DIAGNOSIS — D631 Anemia in chronic kidney disease: Secondary | ICD-10-CM | POA: Diagnosis present

## 2022-06-23 DIAGNOSIS — E0520 Thyrotoxicosis with toxic multinodular goiter without thyrotoxic crisis or storm: Secondary | ICD-10-CM | POA: Diagnosis present

## 2022-06-23 DIAGNOSIS — K5900 Constipation, unspecified: Secondary | ICD-10-CM | POA: Diagnosis not present

## 2022-06-23 DIAGNOSIS — N1832 Chronic kidney disease, stage 3b: Secondary | ICD-10-CM | POA: Diagnosis present

## 2022-06-23 DIAGNOSIS — R001 Bradycardia, unspecified: Secondary | ICD-10-CM | POA: Diagnosis present

## 2022-06-23 DIAGNOSIS — Z7409 Other reduced mobility: Secondary | ICD-10-CM | POA: Diagnosis not present

## 2022-06-23 DIAGNOSIS — R9431 Abnormal electrocardiogram [ECG] [EKG]: Secondary | ICD-10-CM

## 2022-06-23 DIAGNOSIS — N390 Urinary tract infection, site not specified: Secondary | ICD-10-CM | POA: Diagnosis present

## 2022-06-23 DIAGNOSIS — J9601 Acute respiratory failure with hypoxia: Secondary | ICD-10-CM

## 2022-06-23 DIAGNOSIS — G9340 Encephalopathy, unspecified: Secondary | ICD-10-CM

## 2022-06-23 DIAGNOSIS — R1310 Dysphagia, unspecified: Secondary | ICD-10-CM | POA: Diagnosis not present

## 2022-06-23 DIAGNOSIS — I110 Hypertensive heart disease with heart failure: Secondary | ICD-10-CM | POA: Diagnosis not present

## 2022-06-23 DIAGNOSIS — I10 Essential (primary) hypertension: Secondary | ICD-10-CM | POA: Diagnosis not present

## 2022-06-23 DIAGNOSIS — N179 Acute kidney failure, unspecified: Secondary | ICD-10-CM

## 2022-06-23 DIAGNOSIS — D638 Anemia in other chronic diseases classified elsewhere: Secondary | ICD-10-CM | POA: Diagnosis not present

## 2022-06-23 DIAGNOSIS — Z66 Do not resuscitate: Secondary | ICD-10-CM | POA: Diagnosis present

## 2022-06-23 DIAGNOSIS — Z79899 Other long term (current) drug therapy: Secondary | ICD-10-CM | POA: Diagnosis not present

## 2022-06-23 DIAGNOSIS — R2681 Unsteadiness on feet: Secondary | ICD-10-CM | POA: Diagnosis not present

## 2022-06-23 DIAGNOSIS — K219 Gastro-esophageal reflux disease without esophagitis: Secondary | ICD-10-CM | POA: Diagnosis not present

## 2022-06-23 DIAGNOSIS — E559 Vitamin D deficiency, unspecified: Secondary | ICD-10-CM | POA: Diagnosis not present

## 2022-06-23 DIAGNOSIS — R0902 Hypoxemia: Secondary | ICD-10-CM | POA: Diagnosis present

## 2022-06-23 DIAGNOSIS — G9349 Other encephalopathy: Secondary | ICD-10-CM | POA: Diagnosis present

## 2022-06-23 DIAGNOSIS — R278 Other lack of coordination: Secondary | ICD-10-CM | POA: Diagnosis not present

## 2022-06-23 DIAGNOSIS — H409 Unspecified glaucoma: Secondary | ICD-10-CM | POA: Diagnosis not present

## 2022-06-23 DIAGNOSIS — L89151 Pressure ulcer of sacral region, stage 1: Secondary | ICD-10-CM | POA: Diagnosis present

## 2022-06-23 DIAGNOSIS — E7800 Pure hypercholesterolemia, unspecified: Secondary | ICD-10-CM | POA: Diagnosis not present

## 2022-06-23 DIAGNOSIS — J9811 Atelectasis: Secondary | ICD-10-CM | POA: Diagnosis present

## 2022-06-23 DIAGNOSIS — Z789 Other specified health status: Secondary | ICD-10-CM | POA: Diagnosis not present

## 2022-06-23 DIAGNOSIS — G4700 Insomnia, unspecified: Secondary | ICD-10-CM | POA: Diagnosis not present

## 2022-06-23 DIAGNOSIS — E538 Deficiency of other specified B group vitamins: Secondary | ICD-10-CM | POA: Diagnosis not present

## 2022-06-23 DIAGNOSIS — Z87440 Personal history of urinary (tract) infections: Secondary | ICD-10-CM | POA: Diagnosis not present

## 2022-06-23 DIAGNOSIS — I509 Heart failure, unspecified: Secondary | ICD-10-CM

## 2022-06-23 DIAGNOSIS — Z8673 Personal history of transient ischemic attack (TIA), and cerebral infarction without residual deficits: Secondary | ICD-10-CM | POA: Diagnosis not present

## 2022-06-23 DIAGNOSIS — I350 Nonrheumatic aortic (valve) stenosis: Secondary | ICD-10-CM | POA: Diagnosis present

## 2022-06-23 DIAGNOSIS — I420 Dilated cardiomyopathy: Secondary | ICD-10-CM | POA: Diagnosis present

## 2022-06-23 DIAGNOSIS — J309 Allergic rhinitis, unspecified: Secondary | ICD-10-CM | POA: Diagnosis not present

## 2022-06-23 DIAGNOSIS — I679 Cerebrovascular disease, unspecified: Secondary | ICD-10-CM | POA: Diagnosis not present

## 2022-06-23 DIAGNOSIS — I700 Atherosclerosis of aorta: Secondary | ICD-10-CM | POA: Diagnosis not present

## 2022-06-23 DIAGNOSIS — D539 Nutritional anemia, unspecified: Secondary | ICD-10-CM

## 2022-06-23 DIAGNOSIS — G629 Polyneuropathy, unspecified: Secondary | ICD-10-CM | POA: Diagnosis present

## 2022-06-23 DIAGNOSIS — I130 Hypertensive heart and chronic kidney disease with heart failure and stage 1 through stage 4 chronic kidney disease, or unspecified chronic kidney disease: Secondary | ICD-10-CM | POA: Diagnosis present

## 2022-06-23 DIAGNOSIS — Z7401 Bed confinement status: Secondary | ICD-10-CM | POA: Diagnosis not present

## 2022-06-23 DIAGNOSIS — M810 Age-related osteoporosis without current pathological fracture: Secondary | ICD-10-CM | POA: Diagnosis not present

## 2022-06-23 DIAGNOSIS — M6281 Muscle weakness (generalized): Secondary | ICD-10-CM | POA: Diagnosis not present

## 2022-06-23 DIAGNOSIS — H10402 Unspecified chronic conjunctivitis, left eye: Secondary | ICD-10-CM | POA: Diagnosis not present

## 2022-06-23 DIAGNOSIS — E039 Hypothyroidism, unspecified: Secondary | ICD-10-CM | POA: Diagnosis present

## 2022-06-23 DIAGNOSIS — E785 Hyperlipidemia, unspecified: Secondary | ICD-10-CM | POA: Diagnosis present

## 2022-06-23 DIAGNOSIS — I3139 Other pericardial effusion (noninflammatory): Secondary | ICD-10-CM | POA: Diagnosis present

## 2022-06-23 DIAGNOSIS — I5033 Acute on chronic diastolic (congestive) heart failure: Secondary | ICD-10-CM | POA: Diagnosis present

## 2022-06-23 DIAGNOSIS — J9600 Acute respiratory failure, unspecified whether with hypoxia or hypercapnia: Secondary | ICD-10-CM | POA: Diagnosis not present

## 2022-06-23 DIAGNOSIS — I4891 Unspecified atrial fibrillation: Secondary | ICD-10-CM | POA: Diagnosis not present

## 2022-06-23 DIAGNOSIS — I5031 Acute diastolic (congestive) heart failure: Secondary | ICD-10-CM | POA: Diagnosis not present

## 2022-06-23 LAB — TSH: TSH: 4.783 u[IU]/mL — ABNORMAL HIGH (ref 0.350–4.500)

## 2022-06-23 LAB — CBC
HCT: 33.2 % — ABNORMAL LOW (ref 36.0–46.0)
Hemoglobin: 9.8 g/dL — ABNORMAL LOW (ref 12.0–15.0)
MCH: 29.4 pg (ref 26.0–34.0)
MCHC: 29.5 g/dL — ABNORMAL LOW (ref 30.0–36.0)
MCV: 99.7 fL (ref 80.0–100.0)
Platelets: 218 10*3/uL (ref 150–400)
RBC: 3.33 MIL/uL — ABNORMAL LOW (ref 3.87–5.11)
RDW: 15.7 % — ABNORMAL HIGH (ref 11.5–15.5)
WBC: 5.3 10*3/uL (ref 4.0–10.5)
nRBC: 0 % (ref 0.0–0.2)

## 2022-06-23 LAB — URINALYSIS, ROUTINE W REFLEX MICROSCOPIC
Bilirubin Urine: NEGATIVE
Glucose, UA: NEGATIVE mg/dL
Hgb urine dipstick: NEGATIVE
Ketones, ur: NEGATIVE mg/dL
Nitrite: POSITIVE — AB
Protein, ur: 30 mg/dL — AB
Specific Gravity, Urine: 1.009 (ref 1.005–1.030)
pH: 5 (ref 5.0–8.0)

## 2022-06-23 LAB — GLUCOSE, CAPILLARY: Glucose-Capillary: 94 mg/dL (ref 70–99)

## 2022-06-23 LAB — BASIC METABOLIC PANEL
Anion gap: 9 (ref 5–15)
BUN: 37 mg/dL — ABNORMAL HIGH (ref 8–23)
CO2: 24 mmol/L (ref 22–32)
Calcium: 8.7 mg/dL — ABNORMAL LOW (ref 8.9–10.3)
Chloride: 104 mmol/L (ref 98–111)
Creatinine, Ser: 1.36 mg/dL — ABNORMAL HIGH (ref 0.44–1.00)
GFR, Estimated: 36 mL/min — ABNORMAL LOW (ref >60)
Glucose, Bld: 86 mg/dL (ref 70–99)
Potassium: 4 mmol/L (ref 3.5–5.1)
Sodium: 137 mmol/L (ref 135–145)

## 2022-06-23 LAB — PROCALCITONIN: Procalcitonin: <0.1 ng/mL

## 2022-06-23 LAB — ECHOCARDIOGRAM COMPLETE
AR max vel: 1.29 cm2
AV Area VTI: 1.16 cm2
AV Area mean vel: 1.19 cm2
AV Mean grad: 11 mmHg
AV Peak grad: 19.2 mmHg
Ao pk vel: 2.19 m/s
S' Lateral: 3.1 cm
Weight: 1961.21 oz

## 2022-06-23 LAB — T4, FREE: Free T4: 0.88 ng/dL (ref 0.61–1.12)

## 2022-06-23 LAB — VITAMIN B12: Vitamin B-12: 345 pg/mL (ref 180–914)

## 2022-06-23 MED ORDER — SODIUM CHLORIDE 0.9 % IV SOLN
1.0000 g | INTRAVENOUS | Status: DC
  Administered 2022-06-23 – 2022-06-24 (×2): 1 g via INTRAVENOUS
  Filled 2022-06-23 (×2): qty 10

## 2022-06-23 MED ORDER — FUROSEMIDE 10 MG/ML IJ SOLN
20.0000 mg | Freq: Once | INTRAMUSCULAR | Status: AC
  Administered 2022-06-23: 20 mg via INTRAVENOUS
  Filled 2022-06-23: qty 2

## 2022-06-23 NOTE — Progress Notes (Signed)
  Echocardiogram 2D Echocardiogram has been performed.  Cindy Smith 06/23/2022, 4:59 PM

## 2022-06-23 NOTE — Progress Notes (Signed)
Subjective:   Summary: Cindy Smith is a 89 y.o. year old female currently admitted on the IMTS HD#0 for hypoxia.  Overnight Events: Increased to 4 L nasal cannula, oxygenating well.  She continues to be in A-fib, intermittently bradycardic but mostly heart rates in the 60s and 70s.  Normotensive to mildly hypertensive.  She is initially alert and oriented x 0 at the beginning of our assessment, just repeating "I do not know".  She says that she feels cold.  She is not sure if her breathing is improved or not.  Does not think she has had any chest pain or other acute symptoms.  Over the course of assessment, her orientation improved.  She was alert and oriented x 2 (to self and to time with prompting) and also knew the name of her facility by the end of the conversation.  She does not recall the last few days at her facility and is not able to give further history.  More history from daughter/son: Thursday night: started oxygen because she had a low o2 level Friday: granddaughters visited and she seemed confused Saturday: facility unable to keep oxygen levels up via [employer]  Her daughter confirms that the patient should be DNR.  She also does not want any significantly invasive measures, but is okay with antibiotics, or any workup necessary for current management of heart failure.  Objective:  Vital signs in last 24 hours: Vitals:   06/23/22 0104 06/23/22 0115 06/23/22 0619 06/23/22 0859  BP: (!) 141/70  127/60 123/64  Pulse: (!) 42  (!) 53 66  Resp: 18  14 16   Temp: (!) 97.3 F (36.3 C)  (!) 97.5 F (36.4 C) 98.4 F (36.9 C)  TempSrc: Oral     SpO2: 93% 95% 95% 97%  Weight:   55.6 kg    Supplemental O2: Nasal Cannula SpO2: 97 % O2 Flow Rate (L/min): 4 L/min   Physical Exam:  Constitutional: Chronically ill-appearing, NAD Cardiovascular: Irregularly irregular, no murmurs, rubs or gallops.  JVD up to angle of mandible Pulmonary/Chest: normal work of  breathing on room air, lungs clear to auscultation bilaterally Abdominal: soft, mild tenderness to palpation in suprapubic area, non-distended Skin: warm and dry Extremities: upper/lower extremity pulses 2+, no lower extremity edema present  [facility] Weights   06/23/22 0619  Weight: 55.6 kg    No intake or output data in the 24 hours ending 06/23/22 1022 Net IO Since Admission: No IO data has been entered for this period [06/23/22 1022]  Pertinent Labs:    Latest Ref Rng & Units 06/23/2022    4:37 AM 06/22/2022    6:49 PM 06/22/2022    5:41 PM  CBC  WBC 4.0 - 10.5 K/uL 5.3   6.1   Hemoglobin 12.0 - 15.0 g/dL 9.8  16.1  09.6   Hematocrit 36.0 - 46.0 % 33.2  30.0  33.8   Platelets 150 - 400 K/uL 218   250        Latest Ref Rng & Units 06/23/2022    4:37 AM 06/22/2022    6:49 PM 06/22/2022    5:41 PM  CMP  Glucose 70 - 99 mg/dL 86   045   BUN 8 - 23 mg/dL 37   39   Creatinine 4.09 - 1.00 mg/dL 8.11   9.14   Sodium 782 - 145 mmol/L 137  136  135  Potassium 3.5 - 5.1 mmol/L 4.0  4.8  4.5   Chloride 98 - 111 mmol/L 104   103   CO2 22 - 32 mmol/L 24   23   Calcium 8.9 - 10.3 mg/dL 8.7   8.9   Total Protein 6.5 - 8.1 g/dL   7.2   Total Bilirubin 0.3 - 1.2 mg/dL   0.6   Alkaline Phos 38 - 126 U/L   75   AST 15 - 41 U/L   14   ALT 0 - 44 U/L   24     Imaging: US RENAL  Result Date: 06/22/2022 CLINICAL DATA:  Acute kidney injury. EXAM: RENAL / URINARY TRACT ULTRASOUND COMPLETE COMPARISON:  Report of ultrasound complete abdomen 05/04/2002 with no significant renal findings bilaterally. Report of CT abdomen and pelvis with contrast 07/21/2001, stating the kidneys there are both unremarkable. Images for the studies are no longer available in PACS. FINDINGS: Right Kidney: Unable to be visualized. Left Kidney: Renal measurements: Small in size measuring 5.8 x 2.9 x 2.9 cm = volume: 25.1 mL. Echogenicity within normal limits despite the small overall kidney size, but with somewhat thinned  cortex. No mass, stones or hydronephrosis visualized. Bladder: Not seen due to contraction. Other: No free fluid is seen, but there is a moderate-sized right pleural effusion. IMPRESSION: 1. Nonvisualization of the right kidney. 2. The left kidney is small in size with somewhat thin cortex, but has a normal echogenicity. 3. Moderate-sized right pleural effusion. Electronically Signed   By: Almira Bar M.D.   On: 06/22/2022 22:03   DG Chest Port 1 View  Result Date: 06/22/2022 CLINICAL DATA:  Shortness of breath and cough EXAM: PORTABLE CHEST 1 VIEW COMPARISON:  Radiograph 10/26/2002 report FINDINGS: Cardiomegaly. Aortic atherosclerotic calcification. Hazy bilateral airspace and interstitial opacities greatest in the lower lungs. Small bilateral pleural effusions. No pneumothorax. IMPRESSION: 1. Cardiomegaly with hazy bilateral airspace and interstitial opacities greatest in the lower lungs, favor edema though pneumonia is not excluded. 2. Small bilateral pleural effusions. Electronically Signed   By: Minerva Fester M.D.   On: 06/22/2022 18:48     EKG: My EKG interpretation is as follows: A-fib with normal rate, occasionally slow A-fib  Assessment/Plan:   Principal Problem:   Acute hypoxic respiratory failure (HCC) Active Problems:   AKI (acute kidney injury) (HCC)   Anemia of chronic disease   Essential hypertension   Acute heart failure (HCC)   Atrial fibrillation (HCC)   Acute encephalopathy   Patient Summary: Cindy Smith is a 89 y/o female with past medical history of HTN, HLD, CKD3b, CVA, hypothyroidism, glaucoma, GERD, dysphagia, neuropathy, falls that presents for hypoxia and was admitted for acute hypoxic respiratory failure.    #Acute Hypoxic Respiratory Failure #CHF Patient coming in with likely CHF exacerbation.  She had an echo in 2021 showing grade 2 diastolic dysfunction.  Not on any daily diuresis at her facility.  No lower extremity edema on exam today, but did  have some JVD.  Received Lasix 20 yesterday, unsure of diuresis overnight given lack of I's and O's.  Given persistent JVD today, would likely continue to diurese with Lasix 20 and monitor response.  Creatinine improving with diuresis. -Supplemental O2 as necessary - Trend BMP -IV lasix 20 mg daily -Daily weights -Strict Is/Os -Pending echo   #A-fib New onset A-fib.  Of note she has had prior strokes, but has never had A-fib detected on cardiac monitoring.  Source is likely volume overload.  She  has left atrial dilation on prior echoes.  She could also have UTI.  Occasionally bradycardic but mostly within normal rate.  Will continue to monitor with diuresis. -Continue telemetry -Atenolol 25 mg daily, can DC if she develops persistent bradycardia   #Acute Encephalopathy Admitted overnight with concern for encephalopathy which was initially thought to be secondary to hypoxia, with possibly some component of delirium.  She additionally has some signs of UTI with suprapubic tenderness and abnormalities on UA.  Will continue to assess as her hypoxia improved send UTI is treated. -Ceftriaxone 1 g daily (day 1 of 5)   #AKI on CKD3b Baseline Cr ~ 1.  Creatinine 1.43 on admission with GFR of 34. Renal US on admission was unrevealing but was also unable to visualize the right kidney. Worsening renal function may be due to cardiorenal syndrome vs prerenal in the setting of decreased PO intake w/ acutely altered mentation.  Creatinine improved to 1.36 with diuresis, will CTM. -Encourage PO intake - Continue diuresis as above -Trend BMP   #HTN Continues to be normotensive.  Will continue to hold amlodipine 10.  If she becomes persistently bradycardic, can stop atenolol and resume amlodipine instead. -Atenolol 25 mg daily   #Macrocytic Anemia B12 low normal -Vitamin B12 1000 mcg daily   #Goals of Care Patient is at a high risk for decompensation given her chronic comorbidities.  Will treat the  reversible causes of her hypoxia and altered mentation.  Patient's family confirms DNR status, okay with most other measures, but would double check for any kind of invasive procedure.  Diet: Heart Healthy IVF: None,None VTE: Enoxaparin Code: DNR PT/OT recs: Pending TOC recs:  Family Update:   Dispo: Anticipated discharge to Skilled nursing facility pending diuresis, resolution of encephalopathy.   Lyndle Herrlich, MD PGY-1 Internal Medicine Resident Please contact the on call pager after 5 pm and on weekends at [phone].

## 2022-06-23 NOTE — Progress Notes (Signed)
Patient was taken to the vascular at 1550 PM via bed.

## 2022-06-24 DIAGNOSIS — I4891 Unspecified atrial fibrillation: Secondary | ICD-10-CM | POA: Diagnosis not present

## 2022-06-24 DIAGNOSIS — J9811 Atelectasis: Secondary | ICD-10-CM

## 2022-06-24 DIAGNOSIS — I509 Heart failure, unspecified: Secondary | ICD-10-CM | POA: Diagnosis not present

## 2022-06-24 DIAGNOSIS — J9601 Acute respiratory failure with hypoxia: Secondary | ICD-10-CM | POA: Diagnosis not present

## 2022-06-24 DIAGNOSIS — I5031 Acute diastolic (congestive) heart failure: Secondary | ICD-10-CM | POA: Insufficient documentation

## 2022-06-24 LAB — CBC
HCT: 32.9 % — ABNORMAL LOW (ref 36.0–46.0)
Hemoglobin: 10.1 g/dL — ABNORMAL LOW (ref 12.0–15.0)
MCH: 29.6 pg (ref 26.0–34.0)
MCHC: 30.7 g/dL (ref 30.0–36.0)
MCV: 96.5 fL (ref 80.0–100.0)
Platelets: 239 10*3/uL (ref 150–400)
RBC: 3.41 MIL/uL — ABNORMAL LOW (ref 3.87–5.11)
RDW: 15.9 % — ABNORMAL HIGH (ref 11.5–15.5)
WBC: 6.9 10*3/uL (ref 4.0–10.5)
nRBC: 0 % (ref 0.0–0.2)

## 2022-06-24 LAB — BASIC METABOLIC PANEL
Anion gap: 10 (ref 5–15)
BUN: 30 mg/dL — ABNORMAL HIGH (ref 8–23)
CO2: 27 mmol/L (ref 22–32)
Calcium: 8.8 mg/dL — ABNORMAL LOW (ref 8.9–10.3)
Chloride: 102 mmol/L (ref 98–111)
Creatinine, Ser: 1.19 mg/dL — ABNORMAL HIGH (ref 0.44–1.00)
GFR, Estimated: 42 mL/min — ABNORMAL LOW (ref >60)
Glucose, Bld: 82 mg/dL (ref 70–99)
Potassium: 3.8 mmol/L (ref 3.5–5.1)
Sodium: 139 mmol/L (ref 135–145)

## 2022-06-24 LAB — GLUCOSE, CAPILLARY: Glucose-Capillary: 76 mg/dL (ref 70–99)

## 2022-06-24 MED ORDER — POTASSIUM CHLORIDE 20 MEQ PO PACK
20.0000 meq | PACK | Freq: Once | ORAL | Status: AC
  Administered 2022-06-24: 20 meq via ORAL
  Filled 2022-06-24: qty 1

## 2022-06-24 MED ORDER — AMLODIPINE BESYLATE 10 MG PO TABS
10.0000 mg | ORAL_TABLET | Freq: Every day | ORAL | Status: DC
  Administered 2022-06-24 – 2022-06-25 (×2): 10 mg via ORAL
  Filled 2022-06-24 (×2): qty 1

## 2022-06-24 MED ORDER — FUROSEMIDE 20 MG PO TABS
20.0000 mg | ORAL_TABLET | Freq: Every day | ORAL | Status: DC
  Administered 2022-06-24 – 2022-06-25 (×2): 20 mg via ORAL
  Filled 2022-06-24 (×2): qty 1

## 2022-06-24 NOTE — Evaluation (Signed)
Occupational Therapy Evaluation Patient Details Name: Cindy Smith MRN: [id number] DOB: [birth year] Today's Date: 06/24/2022   History of Present Illness Patient is a 89 yo female presenting to the ED with SOB and confusion. Placed on 2L O2 and found to be in Afib. Chest x-ray finding small bilateral PEs. PMH includes:  HTN, HLD, CKD3b, CVA, hypothyroidism, glaucoma, GERD, dysphagia, neuropathy   Clinical Impression   Evaluation completed prior to OT orders being cancelled. Prior to this admission, patient living at [facility], and receiving assist for ADLs, and transfers to wheelchair. Patient no longer ambulates. Currently patient is pleasantly confused, but more oriented than upon admission, and presenting with weakness and need for mod A of 2 for bed mobility, max A of 2 to complete sit<>stand transfers, and max A for ADLs. Per family and patient, patient is close to her baseline, but if able to get rehab at facility would like more independence in her ADLs and transfers.      Recommendations for follow up therapy are one component of a multi-disciplinary discharge planning process, led by the attending physician.  Recommendations may be updated based on patient status, additional functional criteria and insurance authorization.   Assistance Recommended at Discharge Frequent or constant Supervision/Assistance  Patient can return home with the following A lot of help with walking and/or transfers;A lot of help with bathing/dressing/bathroom;Assistance with feeding;Direct supervision/assist for medications management;Direct supervision/assist for financial management;Assist for transportation;Help with stairs or ramp for entrance    Functional Status Assessment  Patient has had a recent decline in their functional status and demonstrates the ability to make significant improvements in function in a reasonable and predictable amount of time.  Equipment Recommendations  None recommended  by OT    Recommendations for Other Services       Precautions / Restrictions Precautions Precautions: Fall Restrictions Weight Bearing Restrictions: No      Mobility Bed Mobility Overal bed mobility: Needs Assistance Bed Mobility: Sit to Supine, Supine to Sit     Supine to sit: Mod assist, +2 for physical assistance, +2 for safety/equipment Sit to supine: Mod assist, +2 for physical assistance, +2 for safety/equipment   General bed mobility comments: Good initation to come into sitting, noted elevation at the trunk, increased asssit at BLEs to come fully into sitting,  assist at trunk and BLEs to return to supine    Transfers Overall transfer level: Needs assistance Equipment used: 2 person hand held assist Transfers: Sit to/from Stand Sit to Stand: Max assist, +2 physical assistance, +2 safety/equipment           General transfer comment: attempting sit<>stand, patient with BLEs propelling foward, minimal weight bearing noted, patient admitting, "they usually just swing me over" and she does not typically come into standing      Balance Overall balance assessment: Needs assistance Sitting-balance support: Bilateral upper extremity supported, Feet supported Sitting balance-Leahy Scale: Poor   Postural control: Posterior lean Standing balance support: Bilateral upper extremity supported, During functional activity, Reliant on assistive device for balance Standing balance-Leahy Scale: Zero Standing balance comment: unable to come into standing                           ADL either performed or assessed with clinical judgement   ADL Overall ADL's : Needs assistance/impaired Eating/Feeding: Moderate assistance;Sitting   Grooming: Wash/dry hands;Wash/dry face;Brushing hair;Moderate assistance;Sitting   Upper Body Bathing: Moderate assistance;Sitting   Lower Body Bathing: Total assistance;Sitting/lateral leans;Sit  to/from stand   Upper Body Dressing :  Moderate assistance;Sitting   Lower Body Dressing: Total assistance;Sitting/lateral leans;Sit to/from stand   Toilet Transfer: Maximal assistance;+2 for safety/equipment;+2 for physical assistance;Squat-pivot   Toileting- Clothing Manipulation and Hygiene: Total assistance;+2 for safety/equipment;+2 for physical assistance;Bed level       Functional mobility during ADLs: Maximal assistance;+2 for physical assistance;+2 for safety/equipment General ADL Comments: Patient is reportedly close to her baseline, but per family would like more independence in ADLs and mobility     Vision Baseline Vision/History: 0 No visual deficits Ability to See in Adequate Light: 2 Moderately impaired Patient Visual Report: No change from baseline Additional Comments: States she needs glasses, family reports this is not a new development     Perception     Praxis      Pertinent Vitals/Pain Pain Assessment Pain Assessment: No/denies pain     Hand Dominance     Extremity/Trunk Assessment Upper Extremity Assessment Upper Extremity Assessment: Generalized weakness   Lower Extremity Assessment Lower Extremity Assessment: Defer to PT evaluation   Cervical / Trunk Assessment Cervical / Trunk Assessment: Kyphotic (minimally)   Communication Communication Communication: HOH   Cognition Arousal/Alertness: Awake/alert Behavior During Therapy: WFL for tasks assessed/performed Overall Cognitive Status: Impaired/Different from baseline Area of Impairment: Following commands, Safety/judgement                       Following Commands: Follows one step commands with increased time Safety/Judgement: Decreased awareness of safety     General Comments: Patient is now oriented, can asnwer questions about prior level with increased time, occasaionally stating "I dont know" when asked questions but would answer with prompting from son in law     General Comments       Exercises     Shoulder  Instructions      Home Living Family/patient expects to be discharged to:: Skilled nursing facility                                 Additional Comments: [facility]      Prior Functioning/Environment Prior Level of Function : History of Falls (last six months);Needs assist             Mobility Comments: Patient needing assist for wheelchair transfers, no longer ambulates ADLs Comments: Patient needing assist for all ADLs, with the exception of minimal grooming tasks        OT Problem List: Decreased strength;Decreased range of motion;Decreased activity tolerance;Impaired balance (sitting and/or standing);Decreased coordination;Decreased cognition;Decreased safety awareness      OT Treatment/Interventions: Self-care/ADL training;Therapeutic exercise;Neuromuscular education;Energy conservation;DME and/or AE instruction;Manual therapy;Therapeutic activities;Cognitive remediation/compensation;Patient/family education;Balance training    OT Goals(Current goals can be found in the care plan section) Acute Rehab OT Goals Patient Stated Goal: to get better OT Goal Formulation: With patient/family Time For Goal Achievement: 07/08/22 Potential to Achieve Goals: Good  OT Frequency: Min 2X/week    Co-evaluation PT/OT/SLP Co-Evaluation/Treatment: Yes Reason for Co-Treatment: Necessary to address cognition/behavior during functional activity;For patient/therapist safety;To address functional/ADL transfers   OT goals addressed during session: ADL's and self-care      AM-PAC OT "6 Clicks" Daily Activity     Outcome Measure Help from another person eating meals?: A Lot Help from another person taking care of personal grooming?: A Lot Help from another person toileting, which includes using toliet, bedpan, or urinal?: Total Help from another person bathing (including washing,  rinsing, drying)?: A Lot Help from another person to put on and taking off regular upper body  clothing?: A Lot Help from another person to put on and taking off regular lower body clothing?: Total 6 Click Score: 10   End of Session Nurse Communication: Mobility status  Activity Tolerance: Patient tolerated treatment well Patient left: in bed;with call bell/phone within reach;with bed alarm set;with family/visitor present  OT Visit Diagnosis: Unsteadiness on feet (R26.81);Other abnormalities of gait and mobility (R26.89);Muscle weakness (generalized) (M62.81);History of falling (Z91.81);Other symptoms and signs involving cognitive function                Time: 4098-1191 OT Time Calculation (min): 22 min Charges:  OT General Charges $OT Visit: 1 Visit OT Evaluation $OT Eval Moderate Complexity: 1 Mod  Pollyann Glen E. Obie Silos, OTR/L Acute Rehabilitation Services [phone]   Cherlyn Cushing 06/24/2022, 12:45 PM

## 2022-06-24 NOTE — Plan of Care (Signed)

## 2022-06-24 NOTE — Progress Notes (Signed)
Subjective:   Summary: Cindy Smith is a 89 y.o. year old female currently admitted on the IMTS HD#1 for hypoxia.  Overnight Events: NAEON, mildly hypertensive, remains in A-fib with rates controlled.  Now on 3 L nasal cannula  Alert and oriented x 4 today.  She is not able to give a good history regarding her symptoms still.  Denies any significant pain, abdominal tenderness, dysuria.  Not sure how her breathing is doing or when problems started.  Her son was in the room and was updated on the plan as well.   Objective:  Vital signs in last 24 hours: Vitals:   06/24/22 0736 06/24/22 1205 06/24/22 1207 06/24/22 1210  BP: (!) 155/62   (!) 159/83  Pulse: 64   82  Resp: 18   20  Temp: 98.1 F (36.7 C)   98.3 F (36.8 C)  TempSrc: Oral   Oral  SpO2: 97% (!) 87% 93% 94%  Weight:       Supplemental O2: Nasal Cannula SpO2: 94 % O2 Flow Rate (L/min): 3 L/min   Physical Exam:  Constitutional: Chronically ill-appearing, NAD Cardiovascular: Irregularly irregular, no murmurs, rubs or gallops.  JVD up to angle of mandible Pulmonary/Chest: normal work of breathing on 3 L nasal cannula, breath sounds diminished in lower lung fields Abdominal: soft, nontender, non-distended.  No CVA tenderness Skin: warm and dry.  No clear sacral ulcers or wounds.  No heel wounds. Extremities: upper/lower extremity pulses 2+, no lower extremity edema present  [facility] Weights   06/23/22 0619 06/24/22 0500  Weight: 55.6 kg 56.3 kg     Intake/Output Summary (Last 24 hours) at 06/24/2022 1328 Last data filed at 06/24/2022 1210 Gross per 24 hour  Intake 150 ml  Output 950 ml  Net -800 ml   Net IO Since Admission: -667 mL [06/24/22 1328]  Pertinent Labs:    Latest Ref Rng & Units 06/24/2022    8:02 AM 06/23/2022    4:37 AM 06/22/2022    6:49 PM  CBC  WBC 4.0 - 10.5 K/uL 6.9  5.3    Hemoglobin 12.0 - 15.0 g/dL 40.9  9.8  81.1   Hematocrit 36.0 - 46.0 % 32.9  33.2  30.0    Platelets 150 - 400 K/uL 239  218         Latest Ref Rng & Units 06/24/2022    8:02 AM 06/23/2022    4:37 AM 06/22/2022    6:49 PM  CMP  Glucose 70 - 99 mg/dL 82  86    BUN 8 - 23 mg/dL 30  37    Creatinine 9.14 - 1.00 mg/dL 7.82  9.56    Sodium 213 - 145 mmol/L 139  137  136   Potassium 3.5 - 5.1 mmol/L 3.8  4.0  4.8   Chloride 98 - 111 mmol/L 102  104    CO2 22 - 32 mmol/L 27  24    Calcium 8.9 - 10.3 mg/dL 8.8  8.7      Imaging: ECHOCARDIOGRAM COMPLETE  Result Date: 06/23/2022    ECHOCARDIOGRAM REPORT   Patient Name:   Cindy Smith Date of Exam: 06/23/2022 Medical Rec #:  [id number]            Height:       60.0 in Accession #:    [id number]  Weight:       122.6 lb Date of Birth:  Sep 06, 1923           BSA:          1.516 m Patient Age:    89 years             BP:           126/47 mmHg Patient Gender: F                    HR:           73 bpm. Exam Location:  Inpatient Procedure: 2D Echo, Color Doppler and Cardiac Doppler Indications:    abnormal ecg  History:        Patient has prior history of Echocardiogram examinations, most                 recent 11/18/2019. Chronic kidney disease, Arrythmias:Atrial                 Fibrillation; Risk Factors:Hypertension.  Sonographer:    Delcie Roch RDCS Referring Phys: [id number] GRACE LAU IMPRESSIONS  1. Left ventricular ejection fraction, by estimation, is 55 to 60%. The left ventricle has normal function. The left ventricle has no regional wall motion abnormalities. There is mild left ventricular hypertrophy. Left ventricular diastolic function could not be evaluated.  2. Right ventricular systolic function is mildly reduced. The right ventricular size is mildly enlarged. There is moderately elevated pulmonary artery systolic pressure. The estimated right ventricular systolic pressure is 46.9 mmHg.  3. Left atrial size was severely dilated.  4. Right atrial size was severely dilated.  5. A small pericardial effusion is present. The  pericardial effusion is posterior to the left ventricle.  6. The mitral valve is degenerative. Mild to moderate mitral valve regurgitation. No evidence of mitral stenosis.  7. Tricuspid valve regurgitation is mild to moderate.  8. The aortic valve is tricuspid. There is moderate calcification of the aortic valve. There is moderate thickening of the aortic valve. Aortic valve regurgitation is not visualized. Mild to moderate aortic valve stenosis. Aortic valve area, by VTI measures 1.16 cm. Aortic valve mean gradient measures 11.0 mmHg. Aortic valve Vmax measures 2.19 m/s.  9. The inferior vena cava is dilated in size with >50% respiratory variability, suggesting right atrial pressure of 8 mmHg. FINDINGS  Left Ventricle: Left ventricular ejection fraction, by estimation, is 55 to 60%. The left ventricle has normal function. The left ventricle has no regional wall motion abnormalities. The left ventricular internal cavity size was normal in size. There is  mild left ventricular hypertrophy. Left ventricular diastolic function could not be evaluated due to atrial fibrillation. Left ventricular diastolic function could not be evaluated. Right Ventricle: The right ventricular size is mildly enlarged. No increase in right ventricular wall thickness. Right ventricular systolic function is mildly reduced. There is moderately elevated pulmonary artery systolic pressure. The tricuspid regurgitant velocity is 3.12 m/s, and with an assumed right atrial pressure of 8 mmHg, the estimated right ventricular systolic pressure is 46.9 mmHg. Left Atrium: Left atrial size was severely dilated. Right Atrium: Right atrial size was severely dilated. Pericardium: A small pericardial effusion is present. The pericardial effusion is posterior to the left ventricle. Mitral Valve: The mitral valve is degenerative in appearance. Mild to moderate mitral annular calcification. Mild to moderate mitral valve regurgitation. No evidence of mitral  valve stenosis. Tricuspid Valve: The tricuspid valve is grossly normal. Tricuspid valve regurgitation is  mild to moderate. No evidence of tricuspid stenosis. Aortic Valve: The aortic valve is tricuspid. There is moderate calcification of the aortic valve. There is moderate thickening of the aortic valve. Aortic valve regurgitation is not visualized. Mild to moderate aortic stenosis is present. Aortic valve mean gradient measures 11.0 mmHg. Aortic valve peak gradient measures 19.2 mmHg. Aortic valve area, by VTI measures 1.16 cm. Pulmonic Valve: The pulmonic valve was grossly normal. Pulmonic valve regurgitation is mild. No evidence of pulmonic stenosis. Aorta: The aortic root and ascending aorta are structurally normal, with no evidence of dilitation. Venous: The inferior vena cava is dilated in size with greater than 50% respiratory variability, suggesting right atrial pressure of 8 mmHg. IAS/Shunts: The atrial septum is grossly normal.  LEFT VENTRICLE PLAX 2D LVIDd:         4.70 cm LVIDs:         3.10 cm LV PW:         1.10 cm LV IVS:        1.30 cm LVOT diam:     2.11 cm LV SV:         56 LV SV Index:   37 LVOT Area:     3.50 cm  RIGHT VENTRICLE            IVC RV Basal diam:  2.70 cm    IVC diam: 2.30 cm RV S prime:     6.42 cm/s TAPSE (M-mode): 0.7 cm LEFT ATRIUM             Index        RIGHT ATRIUM           Index LA diam:        4.60 cm 3.03 cm/m   RA Area:     20.10 cm LA Vol (A2C):   84.8 ml 55.94 ml/m  RA Volume:   60.60 ml  39.97 ml/m LA Vol (A4C):   92.1 ml 60.75 ml/m LA Biplane Vol: 93.3 ml 61.55 ml/m  AORTIC VALVE AV Area (Vmax):    1.29 cm AV Area (Vmean):   1.19 cm AV Area (VTI):     1.16 cm AV Vmax:           219.25 cm/s AV Vmean:          153.750 cm/s AV VTI:            0.482 m AV Peak Grad:      19.2 mmHg AV Mean Grad:      11.0 mmHg LVOT Vmax:         80.73 cm/s LVOT Vmean:        52.133 cm/s LVOT VTI:          0.159 m LVOT/AV VTI ratio: 0.33  AORTA Ao Root diam: 3.10 cm Ao Asc diam:   3.20 cm TRICUSPID VALVE TR Peak grad:   38.9 mmHg TR Vmax:        312.00 cm/s  SHUNTS Systemic VTI:  0.16 m Systemic Diam: 2.11 cm Lennie Odor MD Electronically signed by Lennie Odor MD Signature Date/Time: 06/23/2022/5:58:59 PM    Final      EKG: My EKG interpretation is as follows: A-fib with normal rate, occasionally slow A-fib  Assessment/Plan:   Principal Problem:   Acute hypoxic respiratory failure (HCC) Active Problems:   AKI (acute kidney injury) (HCC)   Anemia of chronic disease   Essential hypertension   Acute heart failure (HCC)   Atrial fibrillation (HCC)  Acute encephalopathy   Atelectasis   Acute congestive heart failure [facility])   Patient Summary: Cindy Smith is a 89 y/o female with past medical history of HTN, HLD, CKD3b, CVA, hypothyroidism, glaucoma, GERD, dysphagia, neuropathy, falls that presents for hypoxia and was admitted for acute hypoxic respiratory failure.    #Acute Hypoxic Respiratory Failure #CHF Patient coming in with likely CHF exacerbation.  She had an echo in 2021 showing grade 2 diastolic dysfunction echo yesterday showed EF 55 to 60%, unfortunately diastolic function could not be evaluated, elevated PA pressure, RV dilation and severe biatrial dilation with small pericardial effusion, aortic stenosis, TR and MR. She clearly has some component of chronic diastolic heart failure.  Of note, PE can also cause elevated PA pressures and right-sided dilation, but she has no clear risk factors other than being nonambulatory at her facility.  Well score is low.  She seems close to euvolemic on exam today.  Urine output only net -500 cc.  No change in weight.  Creatinine continues to improve.  Will change to p.o. diuresis today.  She remains on 3 L nasal cannula.  Still suspect that the CHF is likely one of her primary drivers of hypoxia, but given her chronic nonambulatory status, she could have substantial component of atelectasis as well.  If she is  requiring oxygen due to atelectasis, she might need to be discharged with supplemental O2 back to her facility. -Supplemental O2 as necessary - Trend BMP -P.o. lasix 20 mg daily -Daily weights -Strict Is/Os -Incentive spirometry   #A-fib New onset A-fib.  Of note she has had prior strokes, but has never had A-fib detected on cardiac monitoring.  Echo shows significant dilated cardiomyopathy which could be the precipitating factor..  She could also have UTI.  Occasionally bradycardic but mostly within normal rate.  Will continue to monitor with diuresis. -Continue telemetry -Atenolol 25 mg daily, can DC if she develops persistent bradycardia   #Acute Encephalopathy #UTI Likely due to some combination of hypoxia, UTI, delirium.  Will continue UTI treatment and hypoxia management as above -Ceftriaxone 1 g daily (day 2 of 3)   #AKI on CKD3b Baseline Cr ~ 1.  Creatinine 1.43 on admission with GFR of 34.  Creatinine continues to improve with diuresis, likely initially cardiorenal syndrome, will CTM. -Encourage PO intake - Continue diuresis as above -Trend BMP   #HTN Persistently hypertensive, will also restart her home amlodipine 10. -Atenolol 25 mg daily and restarting amlodipine 10 mg daily today   #Macrocytic Anemia B12 low normal -Vitamin B12 1000 mcg daily   #Goals of Care Patient is at a high risk for decompensation given her chronic comorbidities.  Will treat the reversible causes of her hypoxia and altered mentation.  Patient's family confirms DNR status, okay with most other measures, but would double check for any kind of invasive procedure.  Diet: Heart Healthy IVF: None,None VTE: Enoxaparin Code: DNR PT/OT recs: Pending TOC recs:  Family Update: Family in the room at the time of our assessment   Dispo: Anticipated discharge to Skilled nursing facility pending diuresis, resolution of encephalopathy.   Lyndle Herrlich, MD PGY-1 Internal Medicine  Resident Please contact the on call pager after 5 pm and on weekends at [phone].

## 2022-06-24 NOTE — Progress Notes (Addendum)
Patient Sats dropped to 87% on RA while sitting up. Placed on 3 lit [place] O2 sats 94%.

## 2022-06-24 NOTE — Evaluation (Signed)
Physical Therapy Evaluation Patient Details Name: Cindy Smith MRN: [id number] DOB: 1924/01/24 Today's Date: 06/24/2022  History of Present Illness  Patient is a 89 yo female presenting to the ED with SOB and confusion. Placed on 2L O2 and found to be in Afib. Chest x-ray finding small bilateral PEs. PMH includes:  HTN, HLD, CKD3b, CVA, hypothyroidism, glaucoma, GERD, dysphagia, neuropathy  Clinical Impression   Pt presents with generalized weakness, impaired balance with history of falls, dyspnea on exertion with SPO2 88% on 3LO2 during mobility, and decreased activity tolerance. Pt reports being mostly transfer-level at [facility], today pt requiring max +2 assist for bed mobiltiy and attempted transfer into standing. Pt appropriate to return to SNF vs ALF, pt and family expressing interest in PT at facility if able to improve mobility and decrease assist needed.   Evaluation initiated with PT orders active, MD cancelled PT orders after.    Recommendations for follow up therapy are one component of a multi-disciplinary discharge planning process, led by the attending physician.  Recommendations may be updated based on patient status, additional functional criteria and insurance authorization.  Follow Up Recommendations       Assistance Recommended at Discharge Frequent or constant Supervision/Assistance  Patient can return home with the following  A lot of help with walking and/or transfers;A lot of help with bathing/dressing/bathroom    Equipment Recommendations None recommended by PT  Recommendations for Other Services       Functional Status Assessment Patient has had a recent decline in their functional status and/or demonstrates limited ability to make significant improvements in function in a reasonable and predictable amount of time     Precautions / Restrictions Precautions Precautions: Fall Restrictions Weight Bearing Restrictions: No      Mobility  Bed  Mobility Overal bed mobility: Needs Assistance Bed Mobility: Sit to Supine, Supine to Sit     Supine to sit: Mod assist, +2 for physical assistance, +2 for safety/equipment Sit to supine: Mod assist, +2 for physical assistance, +2 for safety/equipment   General bed mobility comments: assist for trunk and LE management, scooting to/from EOB, and boost up in bed upon return to supine. increased time, step-wise cuing needed    Transfers Overall transfer level: Needs assistance Equipment used: 2 person hand held assist Transfers: Sit to/from Stand Sit to Stand: Max assist, +2 physical assistance, +2 safety/equipment           General transfer comment: assist for all aspects, facilitating rise with hip extension assist. Pt with bilat LEs sliding anteriorly, minimally WB, even with bilat LEs blocked    Ambulation/Gait                  Stairs            Wheelchair Mobility    Modified Rankin (Stroke Patients Only)       Balance Overall balance assessment: Needs assistance Sitting-balance support: Bilateral upper extremity supported, Feet supported Sitting balance-Leahy Scale: Poor   Postural control: Posterior lean Standing balance support: Bilateral upper extremity supported, During functional activity, Reliant on assistive device for balance Standing balance-Leahy Scale: Zero Standing balance comment: unable to come into standing                             Pertinent Vitals/Pain Pain Assessment Pain Assessment: No/denies pain    Home Living Family/patient expects to be discharged to:: Skilled nursing facility  Additional Comments: [facility]    Prior Function Prior Level of Function : History of Falls (last six months);Needs assist             Mobility Comments: Patient needing assist for wheelchair transfers, no longer ambulates ADLs Comments: Patient needing assist for all ADLs, with the exception of minimal  grooming tasks     Hand Dominance   Dominant Hand: Right    Extremity/Trunk Assessment   Upper Extremity Assessment Upper Extremity Assessment: Defer to OT evaluation    Lower Extremity Assessment Lower Extremity Assessment: Generalized weakness    Cervical / Trunk Assessment Cervical / Trunk Assessment: Kyphotic  Communication   Communication: HOH  Cognition Arousal/Alertness: Awake/alert Behavior During Therapy: WFL for tasks assessed/performed Overall Cognitive Status: Impaired/Different from baseline Area of Impairment: Following commands, Safety/judgement                       Following Commands: Follows one step commands with increased time Safety/Judgement: Decreased awareness of safety     General Comments: Patient is  oriented, can asnwer questions about prior level with increased time, occasaionally stating "I dont know" when asked questions but would answer with prompting from son in law        General Comments General comments (skin integrity, edema, etc.): 3LO2, DOE 2/4 with SPO2 88% post-bed mobility and attempted transfer    Exercises     Assessment/Plan    PT Assessment All further PT needs can be met in the next venue of care  PT Problem List Decreased strength;Decreased mobility;Decreased activity tolerance;Decreased balance;Decreased knowledge of use of DME;Decreased safety awareness       PT Treatment Interventions  (n/a)    PT Goals (Current goals can be found in the Care Plan section)  Acute Rehab PT Goals PT Goal Formulation: With patient/family Time For Goal Achievement: 06/24/22 Potential to Achieve Goals: Good    Frequency  (n/a)     Co-evaluation PT/OT/SLP Co-Evaluation/Treatment: Yes Reason for Co-Treatment: Necessary to address cognition/behavior during functional activity;For patient/therapist safety;To address functional/ADL transfers PT goals addressed during session: Mobility/safety with mobility;Balance OT  goals addressed during session: ADL's and self-care       AM-PAC PT "6 Clicks" Mobility  Outcome Measure Help needed turning from your back to your side while in a flat bed without using bedrails?: A Lot Help needed moving from lying on your back to sitting on the side of a flat bed without using bedrails?: A Lot Help needed moving to and from a bed to a chair (including a wheelchair)?: Total Help needed standing up from a chair using your arms (e.g., wheelchair or bedside chair)?: Total Help needed to walk in hospital room?: Total Help needed climbing 3-5 steps with a railing? : Total 6 Click Score: 8    End of Session Equipment Utilized During Treatment: Oxygen Activity Tolerance: Patient limited by fatigue Patient left: in bed;with call bell/phone within reach;with bed alarm set;with family/visitor present Nurse Communication: Mobility status PT Visit Diagnosis: Other abnormalities of gait and mobility (R26.89);Muscle weakness (generalized) (M62.81)    Time: 4098-1191 PT Time Calculation (min) (ACUTE ONLY): 23 min   Charges:   PT Evaluation $PT Eval Low Complexity: 1 Low          Hazley Dezeeuw S, PT DPT Acute Rehabilitation Services Secure Chat Preferred  Office [phone]   Katanya Schlie E Stroup 06/24/2022, 1:19 PM

## 2022-06-25 ENCOUNTER — Other Ambulatory Visit (HOSPITAL_COMMUNITY): Payer: Self-pay

## 2022-06-25 DIAGNOSIS — H10402 Unspecified chronic conjunctivitis, left eye: Secondary | ICD-10-CM | POA: Diagnosis not present

## 2022-06-25 DIAGNOSIS — J9811 Atelectasis: Secondary | ICD-10-CM | POA: Diagnosis not present

## 2022-06-25 DIAGNOSIS — J9600 Acute respiratory failure, unspecified whether with hypoxia or hypercapnia: Secondary | ICD-10-CM | POA: Diagnosis not present

## 2022-06-25 DIAGNOSIS — I679 Cerebrovascular disease, unspecified: Secondary | ICD-10-CM | POA: Diagnosis not present

## 2022-06-25 DIAGNOSIS — I350 Nonrheumatic aortic (valve) stenosis: Secondary | ICD-10-CM

## 2022-06-25 DIAGNOSIS — R2681 Unsteadiness on feet: Secondary | ICD-10-CM | POA: Diagnosis not present

## 2022-06-25 DIAGNOSIS — R0902 Hypoxemia: Secondary | ICD-10-CM | POA: Diagnosis not present

## 2022-06-25 DIAGNOSIS — E559 Vitamin D deficiency, unspecified: Secondary | ICD-10-CM | POA: Diagnosis not present

## 2022-06-25 DIAGNOSIS — K219 Gastro-esophageal reflux disease without esophagitis: Secondary | ICD-10-CM | POA: Diagnosis not present

## 2022-06-25 DIAGNOSIS — I4891 Unspecified atrial fibrillation: Secondary | ICD-10-CM | POA: Diagnosis not present

## 2022-06-25 DIAGNOSIS — G9340 Encephalopathy, unspecified: Secondary | ICD-10-CM | POA: Diagnosis not present

## 2022-06-25 DIAGNOSIS — D638 Anemia in other chronic diseases classified elsewhere: Secondary | ICD-10-CM

## 2022-06-25 DIAGNOSIS — Z789 Other specified health status: Secondary | ICD-10-CM | POA: Diagnosis not present

## 2022-06-25 DIAGNOSIS — H409 Unspecified glaucoma: Secondary | ICD-10-CM | POA: Diagnosis not present

## 2022-06-25 DIAGNOSIS — I110 Hypertensive heart disease with heart failure: Secondary | ICD-10-CM | POA: Diagnosis not present

## 2022-06-25 DIAGNOSIS — R1310 Dysphagia, unspecified: Secondary | ICD-10-CM | POA: Diagnosis not present

## 2022-06-25 DIAGNOSIS — G4700 Insomnia, unspecified: Secondary | ICD-10-CM | POA: Diagnosis not present

## 2022-06-25 DIAGNOSIS — N390 Urinary tract infection, site not specified: Secondary | ICD-10-CM | POA: Diagnosis not present

## 2022-06-25 DIAGNOSIS — E538 Deficiency of other specified B group vitamins: Secondary | ICD-10-CM | POA: Diagnosis not present

## 2022-06-25 DIAGNOSIS — G629 Polyneuropathy, unspecified: Secondary | ICD-10-CM | POA: Diagnosis not present

## 2022-06-25 DIAGNOSIS — J9601 Acute respiratory failure with hypoxia: Secondary | ICD-10-CM | POA: Diagnosis not present

## 2022-06-25 DIAGNOSIS — I700 Atherosclerosis of aorta: Secondary | ICD-10-CM | POA: Diagnosis not present

## 2022-06-25 DIAGNOSIS — M810 Age-related osteoporosis without current pathological fracture: Secondary | ICD-10-CM | POA: Diagnosis not present

## 2022-06-25 DIAGNOSIS — R278 Other lack of coordination: Secondary | ICD-10-CM | POA: Diagnosis not present

## 2022-06-25 DIAGNOSIS — I10 Essential (primary) hypertension: Secondary | ICD-10-CM | POA: Diagnosis not present

## 2022-06-25 DIAGNOSIS — I509 Heart failure, unspecified: Secondary | ICD-10-CM | POA: Diagnosis not present

## 2022-06-25 DIAGNOSIS — I480 Paroxysmal atrial fibrillation: Secondary | ICD-10-CM | POA: Diagnosis not present

## 2022-06-25 DIAGNOSIS — L89151 Pressure ulcer of sacral region, stage 1: Secondary | ICD-10-CM

## 2022-06-25 DIAGNOSIS — J309 Allergic rhinitis, unspecified: Secondary | ICD-10-CM | POA: Diagnosis not present

## 2022-06-25 DIAGNOSIS — Z7401 Bed confinement status: Secondary | ICD-10-CM | POA: Diagnosis not present

## 2022-06-25 DIAGNOSIS — K5900 Constipation, unspecified: Secondary | ICD-10-CM | POA: Diagnosis not present

## 2022-06-25 DIAGNOSIS — Z87440 Personal history of urinary (tract) infections: Secondary | ICD-10-CM | POA: Diagnosis not present

## 2022-06-25 DIAGNOSIS — M6281 Muscle weakness (generalized): Secondary | ICD-10-CM | POA: Diagnosis not present

## 2022-06-25 DIAGNOSIS — I5031 Acute diastolic (congestive) heart failure: Secondary | ICD-10-CM

## 2022-06-25 DIAGNOSIS — E7800 Pure hypercholesterolemia, unspecified: Secondary | ICD-10-CM | POA: Diagnosis not present

## 2022-06-25 DIAGNOSIS — F4323 Adjustment disorder with mixed anxiety and depressed mood: Secondary | ICD-10-CM | POA: Diagnosis not present

## 2022-06-25 DIAGNOSIS — Z7409 Other reduced mobility: Secondary | ICD-10-CM | POA: Diagnosis not present

## 2022-06-25 DIAGNOSIS — N1832 Chronic kidney disease, stage 3b: Secondary | ICD-10-CM | POA: Diagnosis not present

## 2022-06-25 LAB — CBC
HCT: 33 % — ABNORMAL LOW (ref 36.0–46.0)
Hemoglobin: 10.5 g/dL — ABNORMAL LOW (ref 12.0–15.0)
MCH: 30 pg (ref 26.0–34.0)
MCHC: 31.8 g/dL (ref 30.0–36.0)
MCV: 94.3 fL (ref 80.0–100.0)
Platelets: 259 10*3/uL (ref 150–400)
RBC: 3.5 MIL/uL — ABNORMAL LOW (ref 3.87–5.11)
RDW: 16.1 % — ABNORMAL HIGH (ref 11.5–15.5)
WBC: 6.5 10*3/uL (ref 4.0–10.5)
nRBC: 0 % (ref 0.0–0.2)

## 2022-06-25 LAB — BASIC METABOLIC PANEL
Anion gap: 9 (ref 5–15)
BUN: 24 mg/dL — ABNORMAL HIGH (ref 8–23)
CO2: 27 mmol/L (ref 22–32)
Calcium: 9.1 mg/dL (ref 8.9–10.3)
Chloride: 102 mmol/L (ref 98–111)
Creatinine, Ser: 1.19 mg/dL — ABNORMAL HIGH (ref 0.44–1.00)
GFR, Estimated: 42 mL/min — ABNORMAL LOW (ref >60)
Glucose, Bld: 86 mg/dL (ref 70–99)
Potassium: 3.8 mmol/L (ref 3.5–5.1)
Sodium: 138 mmol/L (ref 135–145)

## 2022-06-25 LAB — GLUCOSE, CAPILLARY: Glucose-Capillary: 90 mg/dL (ref 70–99)

## 2022-06-25 MED ORDER — SODIUM CHLORIDE 0.9 % IV SOLN
1.0000 g | INTRAVENOUS | Status: AC
  Administered 2022-06-25: 1 g via INTRAVENOUS
  Filled 2022-06-25: qty 10

## 2022-06-25 MED ORDER — FUROSEMIDE 20 MG PO TABS: 20.0000 mg | ORAL_TABLET | Freq: Every day | ORAL | Status: AC | PRN

## 2022-06-25 MED ORDER — VITAMIN B-12 1000 MCG PO TABS
1000.0000 ug | ORAL_TABLET | Freq: Every day | ORAL | Status: DC
  Filled 2022-06-25: qty 1

## 2022-06-25 MED ORDER — CYANOCOBALAMIN 1000 MCG PO TABS
1000.0000 ug | ORAL_TABLET | Freq: Every day | ORAL | 0 refills | Status: AC
  Filled 2022-06-25: qty 30, 30d supply, fill #0

## 2022-06-25 MED ORDER — POLYETHYLENE GLYCOL 3350 17 G PO PACK: 17.0000 g | PACK | Freq: Every day | ORAL | 0 refills | Status: AC | PRN

## 2022-06-25 NOTE — [place] FL2 (Signed)
[place] MEDICAID FL2 LEVEL OF CARE FORM     IDENTIFICATION  Patient Name: Cindy Smith Birthdate: Jul 09, 1923 Sex: female Admission Date (Current Location): 06/22/2022  [facility] [place] Number:  Producer, television/film/video and Address:  [facility], [address], [place], [place] [address]      Provider Number: [id number]  Attending Physician Name and Address:  Gust Rung, DO  Relative Name and Phone Number:  Judie Petit (Daughter)  [phone]    Current Level of Care: Hospital Recommended Level of Care: Skilled Nursing Facility Prior Approval Number:    Date Approved/Denied:   PASRR Number: [id number]  Discharge Plan: SNF    Current Diagnoses: Patient Active Problem List   Diagnosis Date Noted   Atelectasis 06/24/2022   Acute congestive heart failure (HCC) 06/24/2022   Acute hypoxic respiratory failure (HCC) 06/22/2022   Acute heart failure (HCC) 06/22/2022   Atrial fibrillation (HCC) 06/22/2022   Acute encephalopathy 06/22/2022   Osteoarthritis of left knee 12/08/2019   Stage 3a chronic kidney disease (HCC)    Anemia of chronic disease    History of colon cancer    Essential hypertension    Muscle spasm    Infarction of right basal ganglia (HCC) 11/19/2019   Pressure injury of skin 11/19/2019   AKI (acute kidney injury) (HCC) 11/17/2019   Hyponatremia 11/17/2019   Normocytic anemia 11/17/2019   Hypertensive urgency 11/17/2019   Stroke-like symptoms 11/17/2019   Ischemic stroke (HCC) 11/17/2019    Orientation RESPIRATION BLADDER Height & Weight     Self, Time  O2 (3L) Incontinent Weight: 124 lb 1.9 oz (56.3 kg) Height:     BEHAVIORAL SYMPTOMS/MOOD NEUROLOGICAL BOWEL NUTRITION STATUS      Continent Diet (see discharge summary)  AMBULATORY STATUS COMMUNICATION OF NEEDS Skin   Limited Assist Verbally Normal, PU Stage and Appropriate Care (coccyx, bilateral, intact skin with non blanchable redness) PU Stage 1  Dressing: No Dressing (Coccyx-midstage 1, intact skin with non-blanchable redness)                     Personal Care Assistance Level of Assistance  Bathing, Feeding, Dressing Bathing Assistance: Limited assistance Feeding assistance: Independent Dressing Assistance: Limited assistance     Functional Limitations Info  Sight, Hearing, Speech Sight Info: Impaired (Sight Info: Impaired (Uses eye drops. Also has bad peripheral vision)) Hearing Info: Impaired (Hearing Info: Impaired (Difficulty hearing since stroke.)) Speech Info: Adequate    SPECIAL CARE FACTORS FREQUENCY  PT (By licensed PT), OT (By licensed OT)     PT Frequency: 5x/week OT Frequency: 5x/week            Contractures Contractures Info: Not present    Additional Factors Info  Code Status, Allergies Code Status Info: DNR Allergies Info: Asa (Aspirin)  Codeine  Lactose Intolerance (Gi)  Lipitor (Atorvastatin)  Lotemax (Loteprednol)  Prednisone  Sulfamethoxazole-trimethoprim           Current Medications (06/25/2022):  This is the current hospital active medication list Current Facility-Administered Medications  Medication Dose Route Frequency Provider Last Rate Last Admin   acetaminophen (TYLENOL) tablet 650 mg  650 mg Oral Q6H PRN Doran Stabler, DO       Or   acetaminophen (TYLENOL) suppository 650 mg  650 mg Rectal Q6H PRN Doran Stabler, DO       amLODipine (NORVASC) tablet 10 mg  10 mg Oral Daily Lyndle Herrlich, MD   10 mg at 06/25/22 [phone]  atenolol (TENORMIN) tablet 25 mg  25 mg Oral Daily Doran Stabler, DO   25 mg at 06/25/22 0958   cefTRIAXone (ROCEPHIN) 1 g in sodium chloride 0.9 % 100 mL IVPB  1 g Intravenous Q24H Lyndle Herrlich, MD 200 mL/hr at 06/24/22 1203 1 g at 06/24/22 1203   cyanocobalamin (VITAMIN B12) tablet 1,000 mcg  1,000 mcg Oral Daily Doran Stabler, DO   1,000 mcg at 06/25/22 0958   dorzolamide-timolol (COSOPT) 2-0.5 % ophthalmic solution 1 drop  1 drop Both Eyes BID  Doran Stabler, DO   1 drop at 06/25/22 1002   enoxaparin (LOVENOX) injection 30 mg  30 mg Subcutaneous Q24H Doran Stabler, DO   30 mg at 06/24/22 2207   furosemide (LASIX) tablet 20 mg  20 mg Oral Daily Masters, Katie, DO   20 mg at 06/25/22 0958   polyethylene glycol (MIRALAX / GLYCOLAX) packet 17 g  17 g Oral Daily PRN Doran Stabler, DO         Discharge Medications: Please see discharge summary for a list of discharge medications.  Relevant Imaging Results:  Relevant Lab Results:   Additional Information SSN: [id number]  Brea Coleson A [place], [place]

## 2022-06-25 NOTE — Discharge Summary (Addendum)
Name: Cindy Smith MRN: [id number] DOB: Jan 27, 1924 89 y.o. PCP: Cindy Han, NP (Inactive)  Date of Admission: 06/22/2022  5:20 PM Date of Discharge: 06/25/22 Attending Physician: Dr. Cleda Daub  Discharge Diagnosis: Principal Problem:   Acute hypoxic respiratory failure (HCC) Active Problems:   AKI (acute kidney injury) (HCC)   Anemia of chronic disease   Essential hypertension   Acute heart failure (HCC)   Atrial fibrillation (HCC)   Acute encephalopathy   Atelectasis   Acute diastolic (congestive) heart failure (HCC)   Pressure injury of coccygeal region, stage 1   Aortic stenosis, moderate    Discharge Medications: Allergies as of 06/25/2022       Reactions   Asa [aspirin] Other (See Comments)   Capillary bleeds   Codeine Other (See Comments)   "I just pass out"   Lactose Intolerance (gi) Diarrhea, Other (See Comments)   Because of chemo, some loose stools occur   Lipitor [atorvastatin] Other (See Comments)   "Made my muscles feel like jelly"   Lotemax [loteprednol] Other (See Comments)   Vertigo   Prednisone Other (See Comments)   "Steroids cause dizziness and nerve problems"   Sulfamethoxazole-trimethoprim Rash        Medication List     TAKE these medications    amLODipine 10 MG tablet Commonly known as: NORVASC Take 1 tablet (10 mg total) by mouth daily.   atenolol 25 MG tablet Commonly known as: TENORMIN Take 1 tablet (25 mg total) by mouth daily.   carboxymethylcellulose 0.5 % Soln Commonly known as: REFRESH PLUS Place 1 drop into both eyes 3 (three) times daily as needed (Three time daily).   cetirizine 5 MG chewable tablet Commonly known as: ZYRTEC Chew 5 mg by mouth daily.   cyanocobalamin 1000 MCG tablet Take 1 tablet (1,000 mcg total) by mouth daily.   dorzolamide-timolol 2-0.5 % ophthalmic solution Commonly known as: COSOPT Place 1 drop into both eyes 2 (two) times daily.   furosemide 20 MG tablet Commonly known  as: LASIX Take 1 tablet (20 mg total) by mouth daily as needed for edema (Please give a dose of Lasix if she is gaining 2 pounds in a day or 5 pounds in a week/has lower extremity edema present).   melatonin 3 MG Tabs tablet Take 3 mg by mouth at bedtime.   polyethylene glycol 17 g packet Commonly known as: MIRALAX / GLYCOLAX Take 17 g by mouth daily as needed for mild constipation.   Rocklatan 0.02-0.005 % Soln Generic drug: Netarsudil-Latanoprost Place 1 drop into both eyes at bedtime.   saccharomyces boulardii 250 MG capsule Commonly known as: FLORASTOR Take 1 capsule (250 mg total) by mouth 2 (two) times daily. What changed: when to take this   sennosides-docusate sodium 8.6-50 MG tablet Commonly known as: SENOKOT-S Take 2 tablets by mouth at bedtime.   Vitamin D3 50 MCG Caps Take 1 capsule by mouth daily.               Durable Medical Equipment  (From admission, onward)           Start     Ordered   06/25/22 1151  DME Oxygen  Once       Question Answer Comment  Length of Need Lifetime   Mode or (Route) Nasal cannula   Frequency Continuous (stationary and portable oxygen unit needed)   Oxygen conserving device Yes   Oxygen delivery system Gas      06/25/22 1154  Discharge Care Instructions  (From admission, onward)           Start     Ordered   06/25/22 0000  Discharge wound care:       Comments: Continue routine monitoring for sacral wound and appropriate wound care   06/25/22 1154            Disposition and follow-up:   [name] was discharged from [facility] in Stable condition.  At the hospital follow up visit please address:  1.  Follow-up:  a.  She will be discharged on 2 L nasal cannula.  This can be weaned as possible at long-term facility.  I suspect that hypoxia is caused by some combination of heart failure and atelectasis.  She will be discharged on regimen of Lasix 20 as  needed and would also benefit from mobility with PT/OT and incentive spirometry    b.  She was treated for UTI during the hospital stay and is at her baseline mental status at time of discharge.  She is alert and oriented x 4.   c.  Family has opted to make her DNR during this hospital stay   2.  Labs / imaging needed at time of follow-up: BMP  3.  Pending labs/ test needing follow-up:   Follow-up Appointments:  Contact information for after-discharge care     Destination     [facility] .   Service: Skilled Nursing Contact information: [address] Test Update Address [place] [address] [phone]                     Hospital Course by problem list: [name] is a 89 y/o female with past medical history of HTN, HLD, CKD3b, CVA, hypothyroidism, glaucoma, GERD, dysphagia, neuropathy, falls that presents for hypoxia and AMS and was admitted for acute hypoxic respiratory failure.    #Acute Hypoxic Respiratory Failure #Acute diastolic CHF #Mild to moderate Aortic Stenosis Patient coming in with hypoxia requiring 4 L nasal cannula secondary to likely CHF exacerbation and atelectasis.  She does not look significantly hypervolemic on admission.  She had an echo in 2021 showing grade 2 diastolic dysfunction echo during this admission showed EF 55 to 60%, unfortunately diastolic function could not be evaluated, elevated PA pressure, RV dilation and severe biatrial dilation with small pericardial effusion, aortic stenosis, TR and MR. She clearly has some component of chronic diastolic heart failure.  Of note, PE can also cause elevated PA pressures and right-sided dilation, but she has no clear risk factors other than being nonambulatory at her facility.  Wells score is low.  She was diuresed during the hospital stay with some improvement in her oxygen saturations.  Creatinine also improved with diuresis, indicating likely cardiorenal  syndrome on admission.  On day of discharge, she was weaned to 2 L nasal cannula, but still required some supplemental oxygen.  She appeared euvolemic and will be discharged with Lasix 20 as needed as well as PT OT with SNF and incentive spirometry which should help with atelectasis.  Supplemental O2 can be weaned as possible in the outpatient setting.   #Paroxsymal A-fib New onset A-fib.  Of note she has had prior strokes, but has never had A-fib detected on cardiac monitoring.  Echo shows significant dilated cardiomyopathy which could be the precipitating factor..  She could also have UTI.  Occasionally bradycardic but mostly within normal rate.  Will continue to monitor with diuresis.  She is still intermittently in A-fib at the time of discharge, but rates have been controlled on atenolol.  Would not recommend any kind of aggressive rhythm control at her age.   #Acute Encephalopathy #UTI Likely due to some combination of hypoxia, UTI, delirium.  Mentation improved with treatment of UTI and weaning of oxygen.  She was alert and oriented x 4 on the day of discharge.  I suspect she will still have some element of delirium.  #AKI on CKD3b Baseline Cr ~ 1.  Creatinine 1.43 on admission with GFR of 34, likely due to cardiorenal syndrome.  Creatinine improved with diuresis and was at baseline on day of discharge.   #HTN Continue home atenolol 25, amlodipine 10   #Macrocytic Anemia B12 is low normal, continue home B12 p.o. supplementation   #Goals of Care Patient is at a high risk for decompensation given her chronic comorbidities.  Will treat the reversible causes of her hypoxia and altered mentation.  Patient's family confirms DNR status, okay with most other measures, but would double check for any kind of invasive procedure.  Subjective: She is feeling better.  Orientation has improved and she is alert and oriented x 4.  No dysuria, abdominal tenderness.  No chest pain or dyspnea.  She feels  comfortable on 2 L nasal cannula.  Her son Jonny Ruiz was in the room at time of our assessment.  Discussed plan with him, and he is amenable to discharge today.  Discharge Vitals:   BP (!) 157/69 (BP Location: Left Arm)   Pulse 69   Temp 98.7 F (37.1 C)   Resp 16   Wt 56.3 kg   SpO2 97%   BMI 24.24 kg/m  Discharge exam: General: Chronically ill-appearing, NAD CV: Irregularly irregular.  2/6 systolic murmur right upper sternal border. No LE edema Pulmonary: Lungs CTAB. Normal effort. No wheezing or rales. Abdominal: Soft, nontender, nondistended. Normal bowel sounds. Extremities: Radial and DP pulses 2+ and symmetric. Normal ROM. Skin: Warm and dry. No obvious rash or lesions. Neuro: A&Ox3. Moves all extremities. Normal sensation. No focal deficit. Psych: Normal mood and affect   Pertinent Labs, Studies, and Procedures:     Latest Ref Rng & Units 06/25/2022    7:08 AM 06/24/2022    8:02 AM 06/23/2022    4:37 AM  CBC  WBC 4.0 - 10.5 K/uL 6.5  6.9  5.3   Hemoglobin 12.0 - 15.0 g/dL 16.1  09.6  9.8   Hematocrit 36.0 - 46.0 % 33.0  32.9  33.2   Platelets 150 - 400 K/uL 259  239  218        Latest Ref Rng & Units 06/25/2022    7:08 AM 06/24/2022    8:02 AM 06/23/2022    4:37 AM  CMP  Glucose 70 - 99 mg/dL 86  82  86   BUN 8 - 23 mg/dL 24  30  37   Creatinine 0.44 - 1.00 mg/dL 0.45  4.09  8.11   Sodium 135 - 145 mmol/L 138  139  137   Potassium 3.5 - 5.1 mmol/L 3.8  3.8  4.0   Chloride 98 - 111 mmol/L 102  102  104   CO2 22 - 32 mmol/L 27  27  24    Calcium 8.9 - 10.3 mg/dL 9.1  8.8  8.7     ECHOCARDIOGRAM COMPLETE  Result Date: 06/23/2022    ECHOCARDIOGRAM REPORT   Patient Name:   Lajuana Ripple Date of Exam:  06/23/2022 Medical Rec #:  [id number]            Height:       60.0 in Accession #:    [id number]           Weight:       122.6 lb Date of Birth:  May 19, 1923           BSA:          1.516 m Patient Age:    89 years             BP:           126/47 mmHg Patient Gender: F                     HR:           73 bpm. Exam Location:  Inpatient Procedure: 2D Echo, Color Doppler and Cardiac Doppler Indications:    abnormal ecg  History:        Patient has prior history of Echocardiogram examinations, most                 recent 11/18/2019. Chronic kidney disease, Arrythmias:Atrial                 Fibrillation; Risk Factors:Hypertension.  Sonographer:    Delcie Roch RDCS Referring Phys: [id number] GRACE LAU IMPRESSIONS  1. Left ventricular ejection fraction, by estimation, is 55 to 60%. The left ventricle has normal function. The left ventricle has no regional wall motion abnormalities. There is mild left ventricular hypertrophy. Left ventricular diastolic function could not be evaluated.  2. Right ventricular systolic function is mildly reduced. The right ventricular size is mildly enlarged. There is moderately elevated pulmonary artery systolic pressure. The estimated right ventricular systolic pressure is 46.9 mmHg.  3. Left atrial size was severely dilated.  4. Right atrial size was severely dilated.  5. A small pericardial effusion is present. The pericardial effusion is posterior to the left ventricle.  6. The mitral valve is degenerative. Mild to moderate mitral valve regurgitation. No evidence of mitral stenosis.  7. Tricuspid valve regurgitation is mild to moderate.  8. The aortic valve is tricuspid. There is moderate calcification of the aortic valve. There is moderate thickening of the aortic valve. Aortic valve regurgitation is not visualized. Mild to moderate aortic valve stenosis. Aortic valve area, by VTI measures 1.16 cm. Aortic valve mean gradient measures 11.0 mmHg. Aortic valve Vmax measures 2.19 m/s.  9. The inferior vena cava is dilated in size with >50% respiratory variability, suggesting right atrial pressure of 8 mmHg. FINDINGS  Left Ventricle: Left ventricular ejection fraction, by estimation, is 55 to 60%. The left ventricle has normal function. The left  ventricle has no regional wall motion abnormalities. The left ventricular internal cavity size was normal in size. There is  mild left ventricular hypertrophy. Left ventricular diastolic function could not be evaluated due to atrial fibrillation. Left ventricular diastolic function could not be evaluated. Right Ventricle: The right ventricular size is mildly enlarged. No increase in right ventricular wall thickness. Right ventricular systolic function is mildly reduced. There is moderately elevated pulmonary artery systolic pressure. The tricuspid regurgitant velocity is 3.12 m/s, and with an assumed right atrial pressure of 8 mmHg, the estimated right ventricular systolic pressure is 46.9 mmHg. Left Atrium: Left atrial size was severely dilated. Right Atrium: Right atrial size was severely dilated. Pericardium: A small pericardial effusion is present. The pericardial effusion is posterior to  the left ventricle. Mitral Valve: The mitral valve is degenerative in appearance. Mild to moderate mitral annular calcification. Mild to moderate mitral valve regurgitation. No evidence of mitral valve stenosis. Tricuspid Valve: The tricuspid valve is grossly normal. Tricuspid valve regurgitation is mild to moderate. No evidence of tricuspid stenosis. Aortic Valve: The aortic valve is tricuspid. There is moderate calcification of the aortic valve. There is moderate thickening of the aortic valve. Aortic valve regurgitation is not visualized. Mild to moderate aortic stenosis is present. Aortic valve mean gradient measures 11.0 mmHg. Aortic valve peak gradient measures 19.2 mmHg. Aortic valve area, by VTI measures 1.16 cm. Pulmonic Valve: The pulmonic valve was grossly normal. Pulmonic valve regurgitation is mild. No evidence of pulmonic stenosis. Aorta: The aortic root and ascending aorta are structurally normal, with no evidence of dilitation. Venous: The inferior vena cava is dilated in size with greater than 50% respiratory  variability, suggesting right atrial pressure of 8 mmHg. IAS/Shunts: The atrial septum is grossly normal.  LEFT VENTRICLE PLAX 2D LVIDd:         4.70 cm LVIDs:         3.10 cm LV PW:         1.10 cm LV IVS:        1.30 cm LVOT diam:     2.11 cm LV SV:         56 LV SV Index:   37 LVOT Area:     3.50 cm  RIGHT VENTRICLE            IVC RV Basal diam:  2.70 cm    IVC diam: 2.30 cm RV S prime:     6.42 cm/s TAPSE (M-mode): 0.7 cm LEFT ATRIUM             Index        RIGHT ATRIUM           Index LA diam:        4.60 cm 3.03 cm/m   RA Area:     20.10 cm LA Vol (A2C):   84.8 ml 55.94 ml/m  RA Volume:   60.60 ml  39.97 ml/m LA Vol (A4C):   92.1 ml 60.75 ml/m LA Biplane Vol: 93.3 ml 61.55 ml/m  AORTIC VALVE AV Area (Vmax):    1.29 cm AV Area (Vmean):   1.19 cm AV Area (VTI):     1.16 cm AV Vmax:           219.25 cm/s AV Vmean:          153.750 cm/s AV VTI:            0.482 m AV Peak Grad:      19.2 mmHg AV Mean Grad:      11.0 mmHg LVOT Vmax:         80.73 cm/s LVOT Vmean:        52.133 cm/s LVOT VTI:          0.159 m LVOT/AV VTI ratio: 0.33  AORTA Ao Root diam: 3.10 cm Ao Asc diam:  3.20 cm TRICUSPID VALVE TR Peak grad:   38.9 mmHg TR Vmax:        312.00 cm/s  SHUNTS Systemic VTI:  0.16 m Systemic Diam: 2.11 cm Lennie Odor MD Electronically signed by Lennie Odor MD Signature Date/Time: 06/23/2022/5:58:59 PM    Final    US RENAL  Result Date: 06/22/2022 CLINICAL DATA:  Acute kidney injury. EXAM: RENAL / URINARY TRACT ULTRASOUND COMPLETE  COMPARISON:  Report of ultrasound complete abdomen 05/04/2002 with no significant renal findings bilaterally. Report of CT abdomen and pelvis with contrast 07/21/2001, stating the kidneys there are both unremarkable. Images for the studies are no longer available in PACS. FINDINGS: Right Kidney: Unable to be visualized. Left Kidney: Renal measurements: Small in size measuring 5.8 x 2.9 x 2.9 cm = volume: 25.1 mL. Echogenicity within normal limits despite the small overall  kidney size, but with somewhat thinned cortex. No mass, stones or hydronephrosis visualized. Bladder: Not seen due to contraction. Other: No free fluid is seen, but there is a moderate-sized right pleural effusion. IMPRESSION: 1. Nonvisualization of the right kidney. 2. The left kidney is small in size with somewhat thin cortex, but has a normal echogenicity. 3. Moderate-sized right pleural effusion. Electronically Signed   By: Almira Bar M.D.   On: 06/22/2022 22:03   DG Chest Port 1 View  Result Date: 06/22/2022 CLINICAL DATA:  Shortness of breath and cough EXAM: PORTABLE CHEST 1 VIEW COMPARISON:  Radiograph 10/26/2002 report FINDINGS: Cardiomegaly. Aortic atherosclerotic calcification. Hazy bilateral airspace and interstitial opacities greatest in the lower lungs. Small bilateral pleural effusions. No pneumothorax. IMPRESSION: 1. Cardiomegaly with hazy bilateral airspace and interstitial opacities greatest in the lower lungs, favor edema though pneumonia is not excluded. 2. Small bilateral pleural effusions. Electronically Signed   By: Minerva Fester M.D.   On: 06/22/2022 18:48     Discharge Instructions: Discharge Instructions     Call MD for:  difficulty breathing, headache or visual disturbances   Complete by: As directed    Call MD for:  redness, tenderness, or signs of infection (pain, swelling, redness, odor or green/yellow discharge around incision site)   Complete by: As directed    Call MD for:  temperature >100.4   Complete by: As directed    Diet - low sodium heart healthy   Complete by: As directed    Discharge instructions   Complete by: As directed    1.  She will be discharged on 2 L nasal cannula oxygen.  Please titrate as necessary.  I think her heart failure and atelectasis are likely the causes of hypoxia.  She will be discharged on as needed Lasix 20 and also would continue incentive spirometry as able. 2.  She was treated for UTI.  She will continue last dose of IV  antibiotics on day of discharge before she goes back to her facility. 3.  She is at her mental baseline on day of discharge. 4.  She can benefit from PT/OT which will help with strengthening and also help with her atelectasis.   Discharge wound care:   Complete by: As directed    Continue routine monitoring for sacral wound and appropriate wound care   Increase activity slowly   Complete by: As directed        Discharge Instructions   None     Signed: Lyndle Herrlich, MD 06/25/2022, 1:23 PM   Pager: [phone]

## 2022-06-25 NOTE — TOC Progression Note (Signed)
Transition of Care [facility]) - Progression Note    Patient Details  Name: Cindy Smith MRN: [id number] Date of Birth: 10-21-1923  Transition of [facility] CM/SW Contact  Annasophia Crocker A [place], [place] Phone Number: 06/25/2022, 10:48 AM  Clinical Narrative:     CSW met with pt and pt's son, Jonny Ruiz at bedside to discuss discharge planning. CSW informed them that pt would be discharging today back to [facility] with PT/OT  services at discharge.   CSW contacted facility and they have been made aware.   CSW also contacted pt's daughter, Fletcher Anon, to inform her of pt's discharge. She asked if pt's DNR would go with her to facility, CSW informed her that DNR paperwork will be in pt's discharge packet.     Patient will DC to: [facility]  Anticipated DC date: 06/25/22  Family notified: Gladis Riffle  Transport by: Sharin Mons      Per MD patient ready for DC to [facility]. RN, patient, patient's family, and facility notified of DC. Discharge Summary and FL2 sent to facility. RN to call report prior to discharge ( room 604, [phone]). DC packet on chart. Ambulance transport requested for patient.     CSW will sign off for now as social work intervention is no longer needed. Please consult [place] again if new needs arise.     Barriers to Discharge: No Barriers Identified  Expected Discharge Plan and Services                                               Social Determinants of Health (SDOH) Interventions SDOH Screenings   Food Insecurity: No Food Insecurity (09/19/2021)  Housing: Low Risk  (09/19/2021)  Transportation Needs: No Transportation Needs (09/19/2021)  Depression (PHQ2-9): Low Risk  (12/08/2019)  Tobacco Use: Low Risk  (09/17/2021)    Readmission Risk Interventions     No data to display

## 2022-06-26 ENCOUNTER — Other Ambulatory Visit (HOSPITAL_COMMUNITY): Payer: Self-pay

## 2022-07-02 DIAGNOSIS — G4700 Insomnia, unspecified: Secondary | ICD-10-CM | POA: Diagnosis not present

## 2022-07-02 DIAGNOSIS — F4323 Adjustment disorder with mixed anxiety and depressed mood: Secondary | ICD-10-CM | POA: Diagnosis not present

## 2022-07-21 IMAGING — MR MR HEAD W/O CM
12 of 13 series · 44 of 48 positions shown · non-contrast
Comparison: Head CT earlier today.

CLINICAL DATA: [AGE] female with acute weakness, imbalance.

EXAM:
MRI HEAD WITHOUT CONTRAST
TECHNIQUE: Multiplanar, multiecho pulse sequences of the brain and surrounding
structures were obtained without intravenous contrast.

[Series 5: DWI · axial · 3.0mm · 0.88mm/px · z∈[-111,+28]mm · 8 of 96 slices shown (1 of 4)]
[im 1/96]
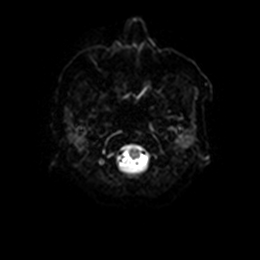
[im 14/96]
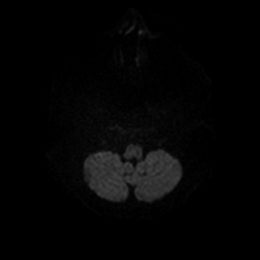
[im 28/96]
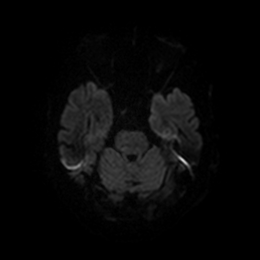
[im 41/96]
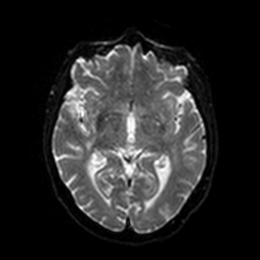
[im 55/96]
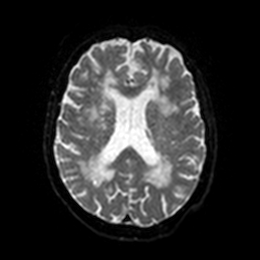
[im 68/96]
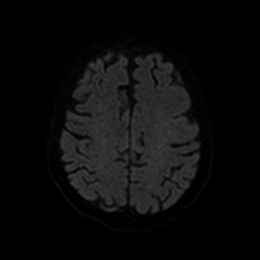
[im 82/96]
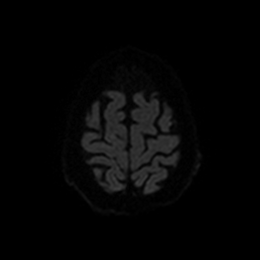
[im 96/96]
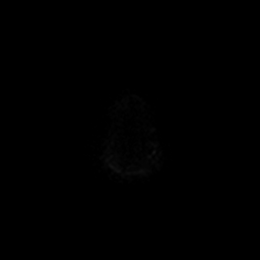

[Series 6: DWI · axial · 3.0mm · 0.88mm/px · z∈[-111,+28]mm · 4 of 48 slices shown (2 of 4)]
[im 1/48]
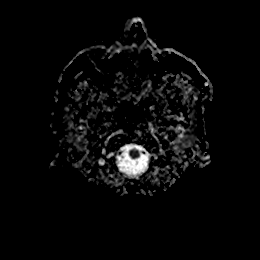
[im 16/48]
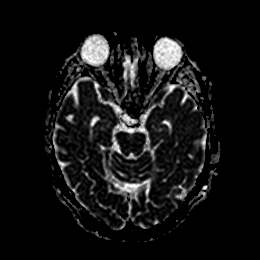
[im 32/48]
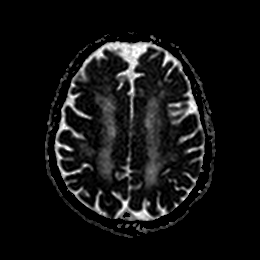
[im 48/48]
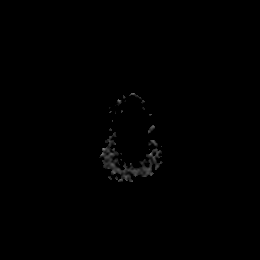

[Series 7: DWI · coronal · 4.0mm · 0.88mm/px · 6 of 76 slices shown (3 of 4)]
[im 1/76]
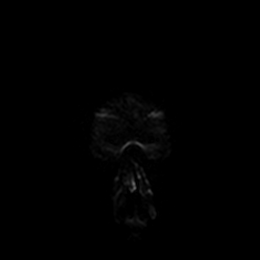
[im 16/76]
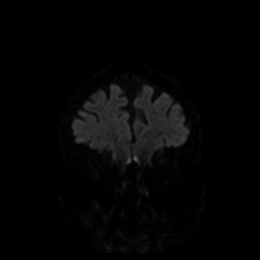
[im 31/76]
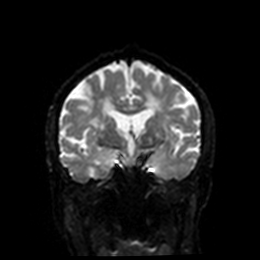
[im 46/76]
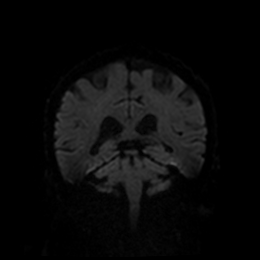
[im 61/76]
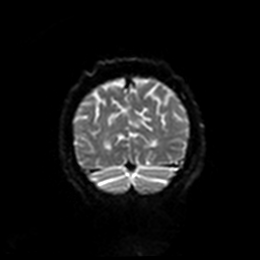
[im 76/76]
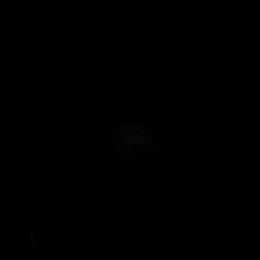

[Series 8: DWI · coronal · 4.0mm · 0.88mm/px · 3 of 36 slices shown (4 of 4)]
[im 1/36]
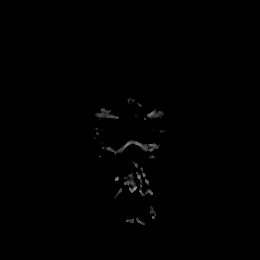
[im 18/36]
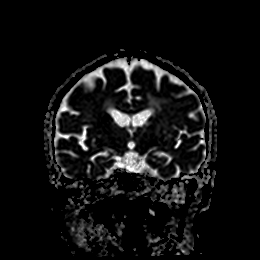
[im 36/36]
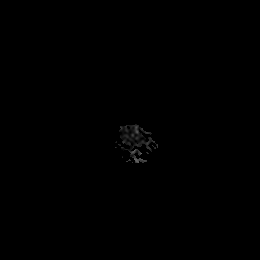

[Series 9: T1 · sagittal · 5.0mm · 0.75mm/px · 2 of 25 slices shown]
[im 1/25]
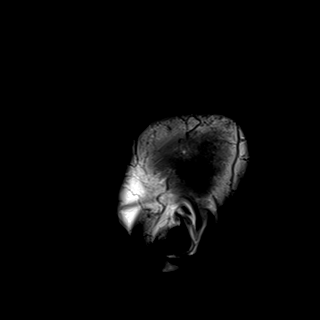
[im 25/25]
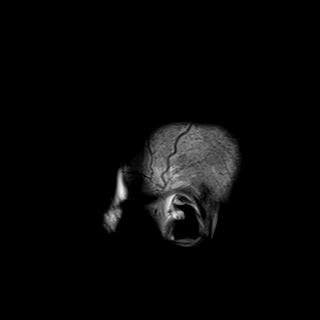

[Series 10: T2 · axial · 5.0mm · 0.72mm/px · z∈[-104,+39]mm · 2 of 25 slices shown (1 of 2)]
[im 1/25]
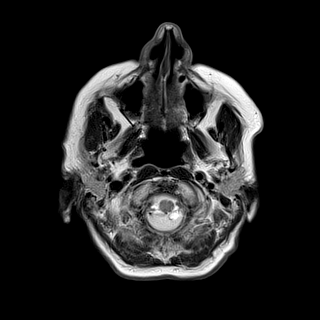
[im 25/25]
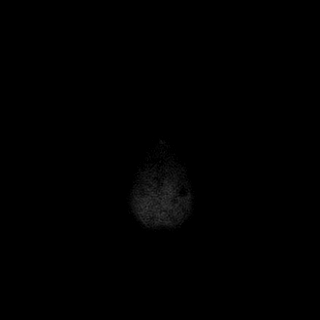

[Series 11: FLAIR · axial · 5.0mm · 0.45mm/px · z∈[-101,+42]mm · 2 of 25 slices shown]
[im 1/25]
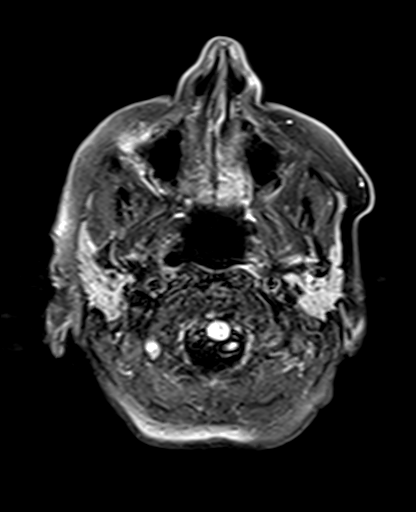
[im 25/25]
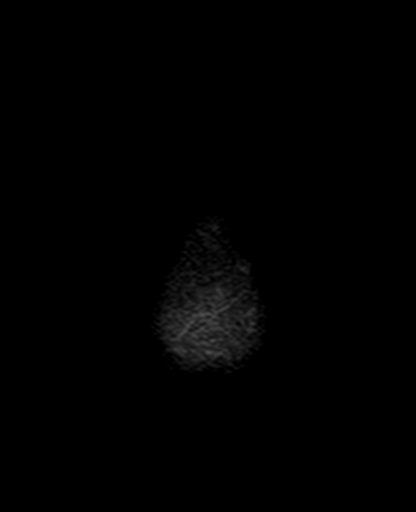

[Series 12: mag_images · axial · 3.0mm · 0.90mm/px · z∈[-100,+40]mm · 4 of 48 slices shown]
[im 1/48]
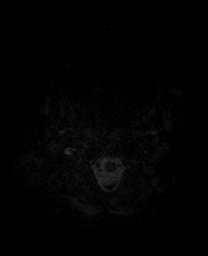
[im 16/48]
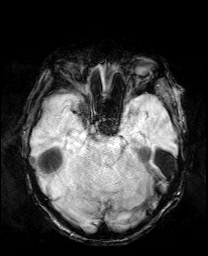
[im 32/48]
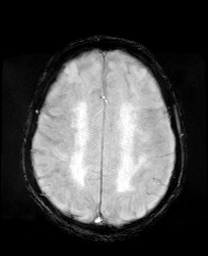
[im 48/48]
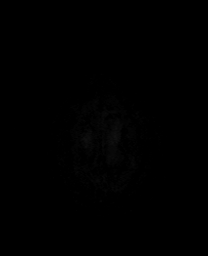

[Series 13: pha_images · axial · 3.0mm · 0.90mm/px · z∈[-100,+40]mm · 4 of 48 slices shown]
[im 1/48]
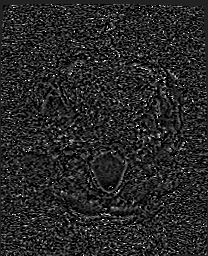
[im 16/48]
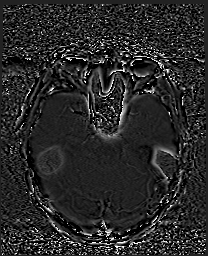
[im 32/48]
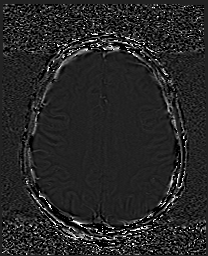
[im 48/48]
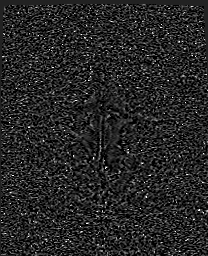

[Series 14: swi_images · axial · 3.0mm · 0.90mm/px · z∈[-100,+40]mm · 4 of 48 slices shown]
[im 1/48]
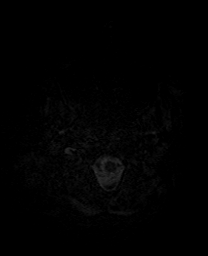
[im 16/48]
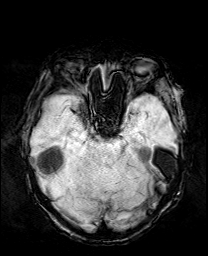
[im 32/48]
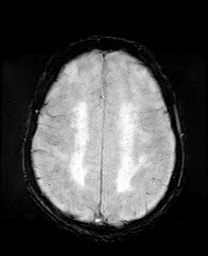
[im 48/48]
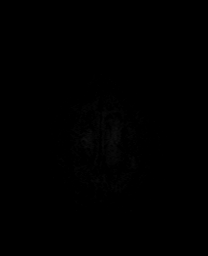

[Series 15: mip_images(sw) · axial · 24.0mm · 0.90mm/px · z∈[-89,+30]mm · 3 of 41 slices shown]
[im 1/41]
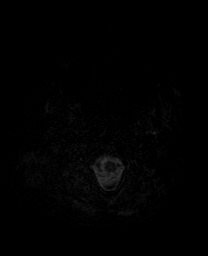
[im 21/41]
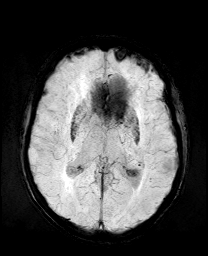
[im 41/41]
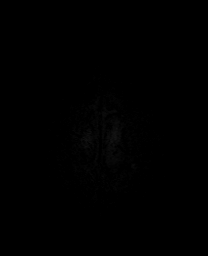

[Series 17: T2 · coronal · 5.0mm · 0.34mm/px · 2 of 29 slices shown (2 of 2)]
[im 1/29]
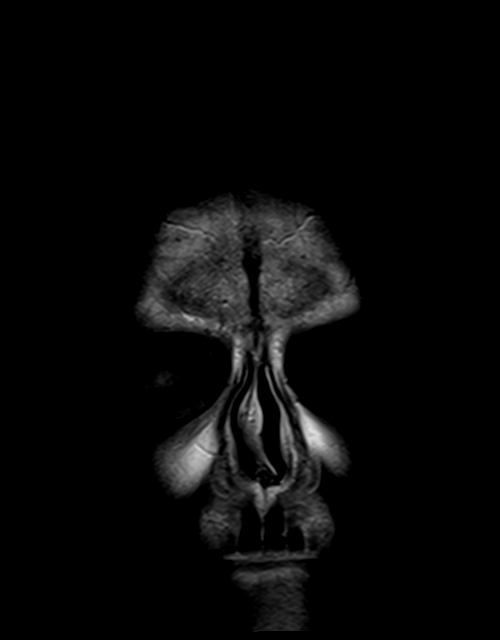
[im 29/29]
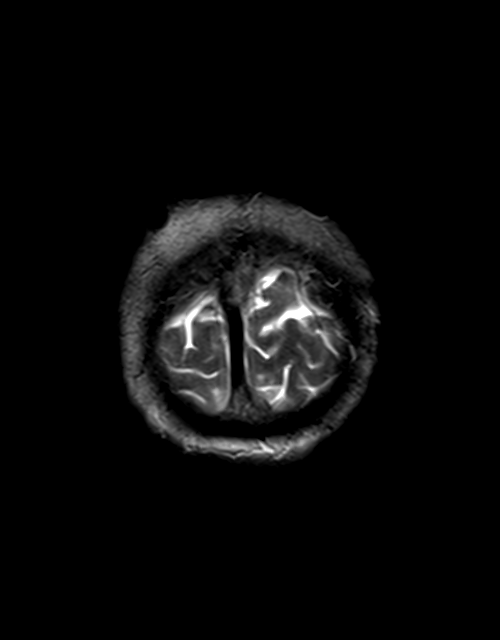

[44 of 48 positions shown; findings below may reference images not displayed]

FINDINGS: Brain: Linear, patchy restricted diffusion in the right corona
radiata tracking toward the external capsule (series 5, image 75 and
series 7, image 62).

No other restricted diffusion. Underlying Patchy and confluent
bilateral cerebral white matter T2 and FLAIR hyperintensity. Similar
signal heterogeneity throughout the bilateral basal ganglia, worse
on the right. Occasional chronic micro hemorrhages (left periatrial
white matter series 14, image 29). No evidence of acute hemorrhage.
No mass effect.

No midline shift, evidence of mass lesion, ventriculomegaly,
extra-axial collection. Cervicomedullary junction and pituitary are
within normal limits. Mild for age signal heterogeneity in the pons.
No cortical encephalomalacia identified.

Vascular: Major intracranial vascular flow voids are preserved.

Skull and upper cervical spine: Cervical spine detailed separately
today. Normal visible bone marrow signal.

Sinuses/Orbits: Negative, postoperative changes to both globes.

Other: Mastoids are well pneumatized. Visible internal auditory
structures appear normal. Scalp and face appear negative.
IMPRESSION: 1. Acute small vessel infarct in the right corona radiata, external
capsule. No associated hemorrhage or mass effect.
2. Underlying moderate to advanced chronic small vessel disease.

## 2022-07-21 IMAGING — MR MR CERVICAL SPINE W/O CM
5 series · 35 of 48 positions shown · non-contrast
Comparison: Brain MRI today reported separately.

CLINICAL DATA: [AGE] female with acute weakness, imbalance.

EXAM:
MRI CERVICAL SPINE WITHOUT CONTRAST
TECHNIQUE: Multiplanar, multisequence MR imaging of the cervical spine was
performed. No intravenous contrast was administered.

[Series 5: T2 · sagittal · 3.0mm · 0.69mm/px · 6 of 15 slices shown (1 of 2)]
[im 1/15]
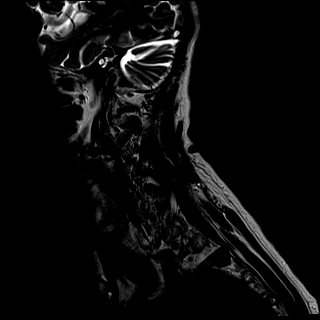
[im 3/15]
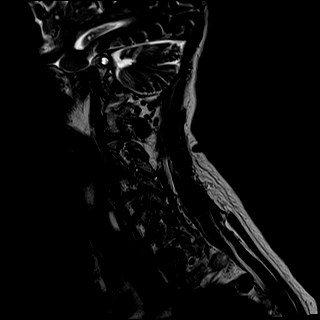
[im 6/15]
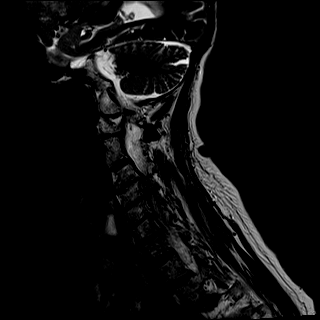
[im 9/15]
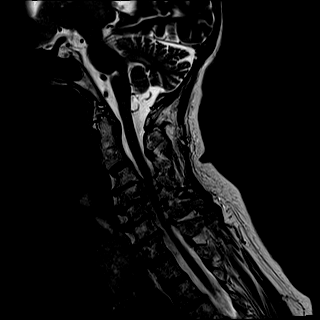
[im 12/15]
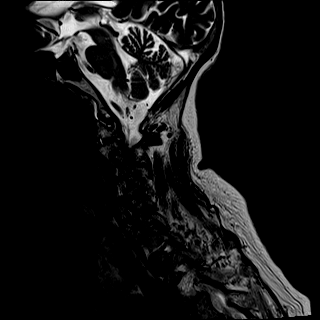
[im 15/15]
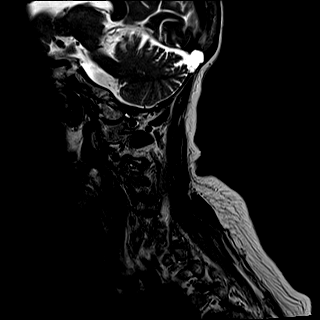

[Series 6: T1 · sagittal · 3.0mm · 0.69mm/px · 6 of 15 slices shown]
[im 1/15]
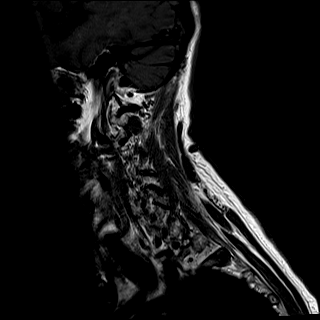
[im 3/15]
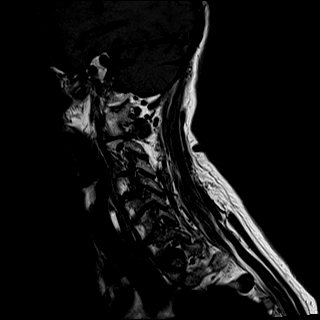
[im 6/15]
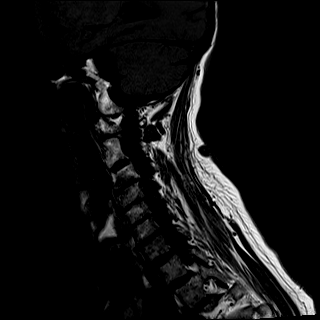
[im 9/15]
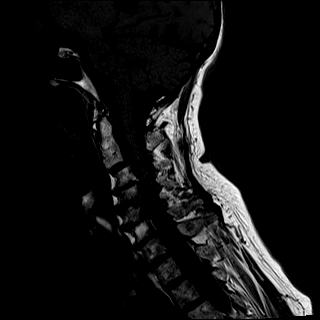
[im 12/15]
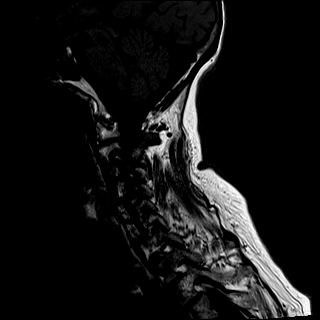
[im 15/15]
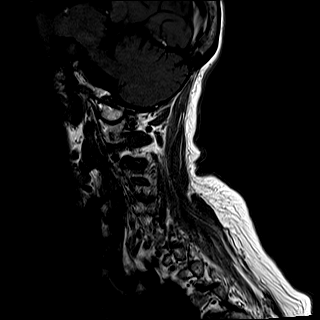

[Series 7: STIR · sagittal · 3.0mm · 0.86mm/px · 6 of 15 slices shown]
[im 1/15]
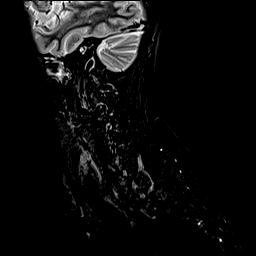
[im 3/15]
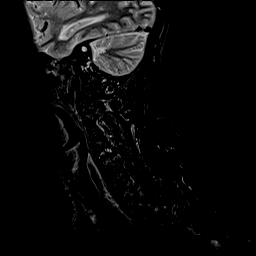
[im 6/15]
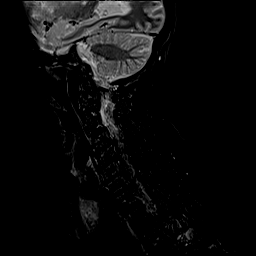
[im 9/15]
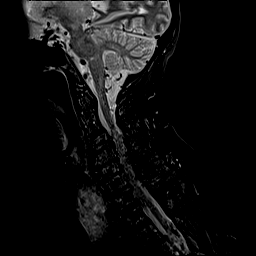
[im 12/15]
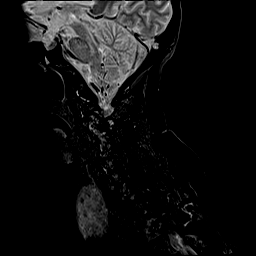
[im 15/15]
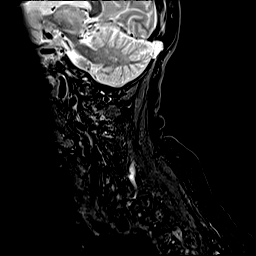

[Series 8: T2 · axial · 3.0mm · 0.66mm/px · z∈[-240,-125]mm · 9 of 40 slices shown (2 of 2)]
[im 1/40]
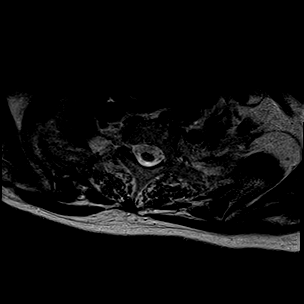
[im 6/40]
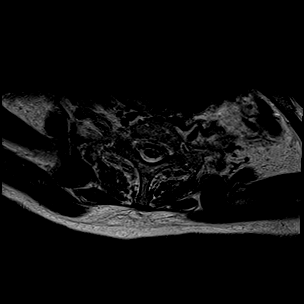
[im 12/40]
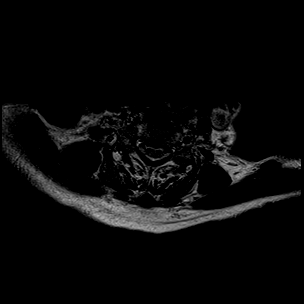
[im 17/40]
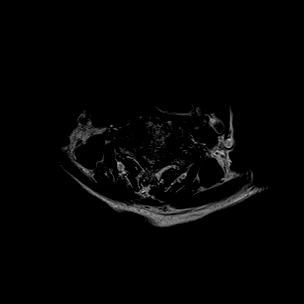
[im 20/40]
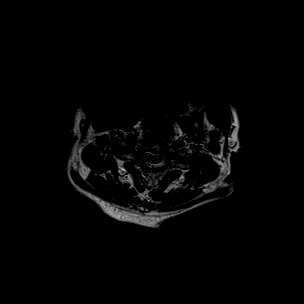
[im 23/40]
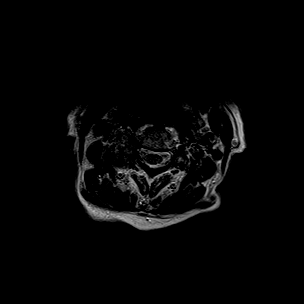
[im 28/40]
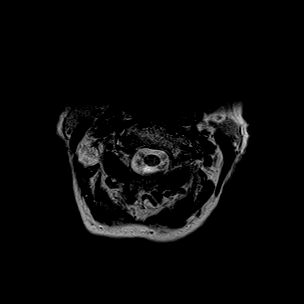
[im 34/40]
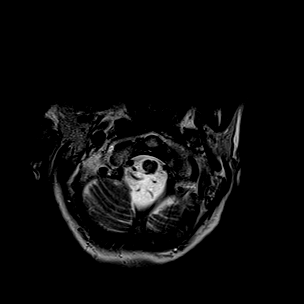
[im 40/40]
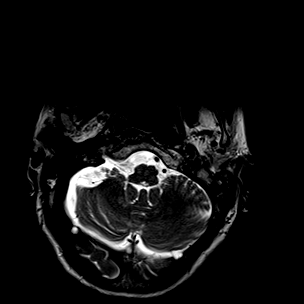

[Series 9: GRE · axial · 3.0mm · 0.39mm/px · z∈[-240,-125]mm · 8 of 40 slices shown]
[im 1/40]
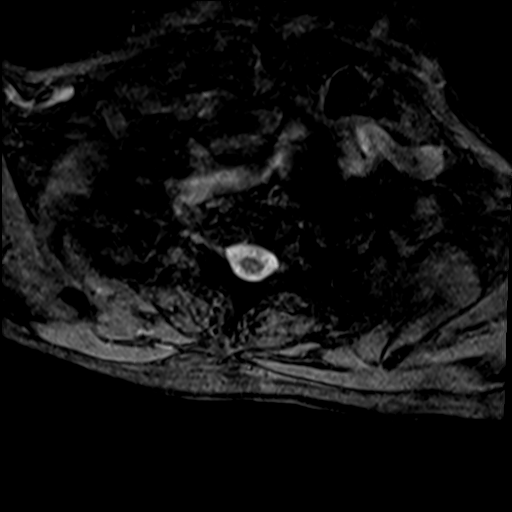
[im 6/40]
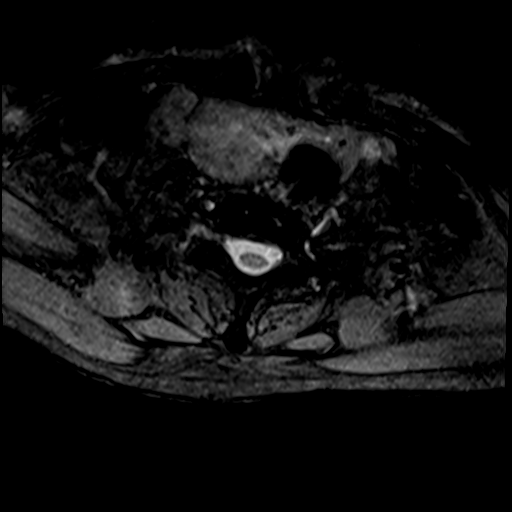
[im 12/40]
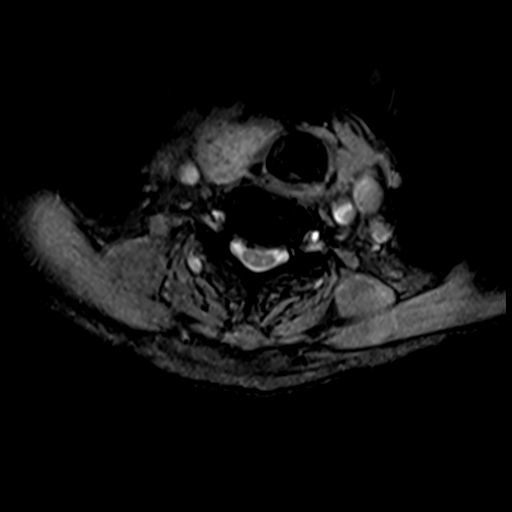
[im 17/40]
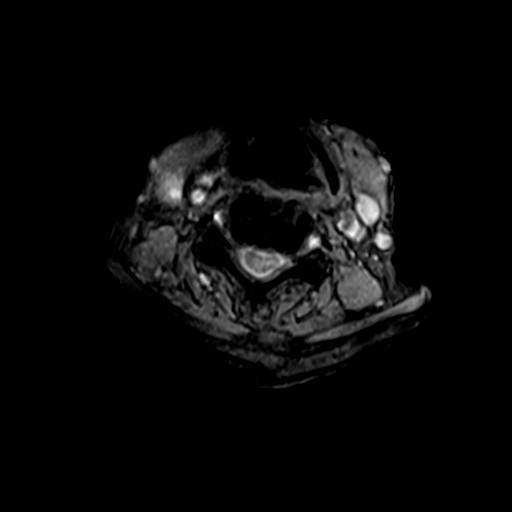
[im 23/40]
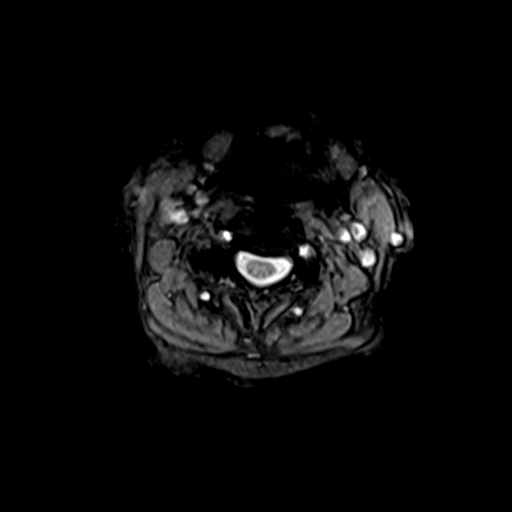
[im 28/40]
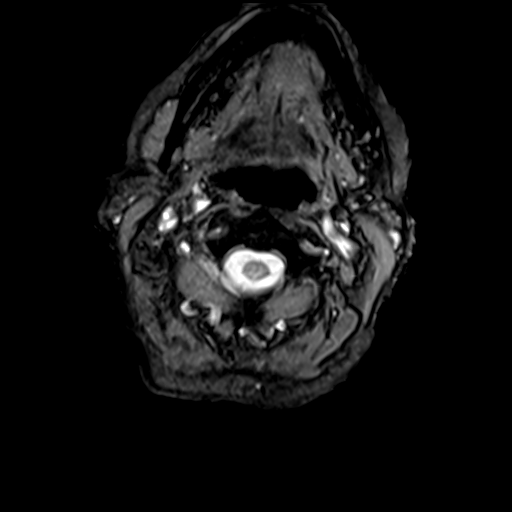
[im 34/40]
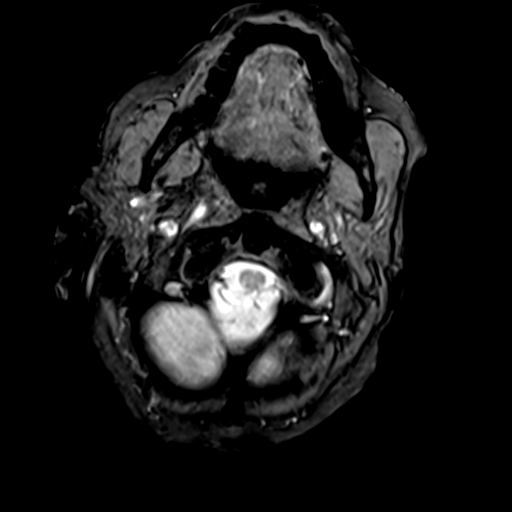
[im 40/40]
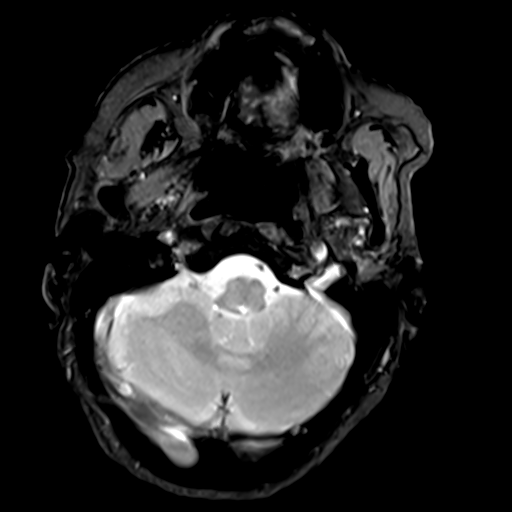

[35 of 48 positions shown; findings below may reference images not displayed]

FINDINGS: Alignment: Mild degenerative anterolisthesis of C2 on C3, C7 on T1.
Overall straightening of cervical lordosis.

Vertebrae: Widespread degenerative endplate marrow signal changes in
the cervical spine. No marrow edema or evidence of acute osseous
abnormality. Benign vertebral body hemangioma at T2 on the right.

Cord: No cervical spinal cord signal abnormality despite multilevel
degenerative stenosis with some cord mass effect detailed below.
Visible upper thoracic cord appears normal.

Posterior Fossa, vertebral arteries, paraspinal tissues:
Cervicomedullary junction is within normal limits. Brain is detailed
separately today. Preserved major vascular flow voids in the neck.

Thyromegaly (series 7, image 6 on the right, but no follow-up
indicated in this age group (ref: [HOSPITAL]. [DATE]):

Disc levels:

C2-C3: Anterolisthesis associated with moderate left and severe
right facet hypertrophy. Ligament flavum hypertrophy. No spinal
stenosis. Moderate to severe bilateral C3 foraminal stenosis greater
on the right.

C3-C4: Severe disc space loss. Circumferential disc osteophyte
complex. Mild to moderate facet and ligament flavum hypertrophy.
Mild spinal stenosis. Mild if any cord mass effect. Severe bilateral
C4 foraminal stenosis.

C4-C5: Severe disc space loss. Circumferential disc osteophyte
complex. Mild to moderate posterior element hypertrophy. Mild spinal
stenosis and spinal cord mass effect. Severe bilateral C5 foraminal
stenosis.

C5-C6: Disc space loss with circumferential disc osteophyte complex.
Mild to moderate posterior element hypertrophy. No significant
spinal stenosis. Moderate left and moderate to severe right C6
foraminal stenosis.

C6-C7: Mild disc space loss. Circumferential disc osteophyte
complex. Moderate facet and ligament flavum hypertrophy. No
significant spinal stenosis. Moderate bilateral C7 foraminal
stenosis.

C7-T1: Mild anterolisthesis. Mild to moderate posterior element
hypertrophy. No significant stenosis.
IMPRESSION: 1. No acute osseous abnormality in the cervical spine. Mild
degenerative spondylolisthesis at C2-C3 and C7-T1 with facet
arthropathy.
2. Widespread cervical spine degeneration. Mild spinal stenosis at
C3-C4 and C4-C5 with up to mild spinal cord mass effect, but no cord
signal abnormality.
3. Widespread moderate and severe degenerative neural foraminal
stenosis, only sparing the C8 nerve level.

## 2022-07-22 DIAGNOSIS — F4323 Adjustment disorder with mixed anxiety and depressed mood: Secondary | ICD-10-CM | POA: Diagnosis not present

## 2022-07-26 DIAGNOSIS — M810 Age-related osteoporosis without current pathological fracture: Secondary | ICD-10-CM | POA: Diagnosis not present

## 2022-07-26 DIAGNOSIS — F330 Major depressive disorder, recurrent, mild: Secondary | ICD-10-CM | POA: Diagnosis not present

## 2022-07-29 DIAGNOSIS — F330 Major depressive disorder, recurrent, mild: Secondary | ICD-10-CM | POA: Diagnosis not present

## 2022-07-29 DIAGNOSIS — G4700 Insomnia, unspecified: Secondary | ICD-10-CM | POA: Diagnosis not present

## 2022-07-30 DIAGNOSIS — N1831 Chronic kidney disease, stage 3a: Secondary | ICD-10-CM | POA: Diagnosis not present

## 2022-07-30 DIAGNOSIS — H10409 Unspecified chronic conjunctivitis, unspecified eye: Secondary | ICD-10-CM | POA: Diagnosis not present

## 2022-07-30 DIAGNOSIS — I5032 Chronic diastolic (congestive) heart failure: Secondary | ICD-10-CM | POA: Diagnosis not present

## 2022-07-30 DIAGNOSIS — D631 Anemia in chronic kidney disease: Secondary | ICD-10-CM | POA: Diagnosis not present

## 2022-07-30 DIAGNOSIS — Z85038 Personal history of other malignant neoplasm of large intestine: Secondary | ICD-10-CM | POA: Diagnosis not present

## 2022-07-30 DIAGNOSIS — I130 Hypertensive heart and chronic kidney disease with heart failure and stage 1 through stage 4 chronic kidney disease, or unspecified chronic kidney disease: Secondary | ICD-10-CM | POA: Diagnosis not present

## 2022-07-30 DIAGNOSIS — G629 Polyneuropathy, unspecified: Secondary | ICD-10-CM | POA: Diagnosis not present

## 2022-07-30 DIAGNOSIS — J9691 Respiratory failure, unspecified with hypoxia: Secondary | ICD-10-CM | POA: Diagnosis not present

## 2022-07-30 DIAGNOSIS — K219 Gastro-esophageal reflux disease without esophagitis: Secondary | ICD-10-CM | POA: Diagnosis not present

## 2022-07-30 DIAGNOSIS — Z9981 Dependence on supplemental oxygen: Secondary | ICD-10-CM | POA: Diagnosis not present

## 2022-07-30 DIAGNOSIS — Z8673 Personal history of transient ischemic attack (TIA), and cerebral infarction without residual deficits: Secondary | ICD-10-CM | POA: Diagnosis not present

## 2022-07-30 DIAGNOSIS — I4891 Unspecified atrial fibrillation: Secondary | ICD-10-CM | POA: Diagnosis not present

## 2022-07-30 DIAGNOSIS — I10 Essential (primary) hypertension: Secondary | ICD-10-CM | POA: Diagnosis not present

## 2022-07-30 DIAGNOSIS — R627 Adult failure to thrive: Secondary | ICD-10-CM | POA: Diagnosis not present

## 2022-07-30 DIAGNOSIS — R410 Disorientation, unspecified: Secondary | ICD-10-CM | POA: Diagnosis not present

## 2022-07-30 DIAGNOSIS — Z87440 Personal history of urinary (tract) infections: Secondary | ICD-10-CM | POA: Diagnosis not present

## 2022-07-30 DIAGNOSIS — N1832 Chronic kidney disease, stage 3b: Secondary | ICD-10-CM | POA: Diagnosis not present

## 2022-07-30 DIAGNOSIS — R634 Abnormal weight loss: Secondary | ICD-10-CM | POA: Diagnosis not present

## 2022-07-30 DIAGNOSIS — I679 Cerebrovascular disease, unspecified: Secondary | ICD-10-CM | POA: Diagnosis not present

## 2022-07-30 DIAGNOSIS — E785 Hyperlipidemia, unspecified: Secondary | ICD-10-CM | POA: Diagnosis not present

## 2022-07-30 DIAGNOSIS — Z7689 Persons encountering health services in other specified circumstances: Secondary | ICD-10-CM | POA: Diagnosis not present

## 2022-07-30 DIAGNOSIS — M6281 Muscle weakness (generalized): Secondary | ICD-10-CM | POA: Diagnosis not present

## 2022-07-30 DIAGNOSIS — D638 Anemia in other chronic diseases classified elsewhere: Secondary | ICD-10-CM | POA: Diagnosis not present

## 2022-07-30 DIAGNOSIS — N390 Urinary tract infection, site not specified: Secondary | ICD-10-CM | POA: Diagnosis not present

## 2022-07-30 DIAGNOSIS — I509 Heart failure, unspecified: Secondary | ICD-10-CM | POA: Diagnosis not present

## 2022-07-30 DIAGNOSIS — J302 Other seasonal allergic rhinitis: Secondary | ICD-10-CM | POA: Diagnosis not present

## 2022-07-30 DIAGNOSIS — H409 Unspecified glaucoma: Secondary | ICD-10-CM | POA: Diagnosis not present

## 2022-07-31 DIAGNOSIS — D631 Anemia in chronic kidney disease: Secondary | ICD-10-CM | POA: Diagnosis not present

## 2022-07-31 DIAGNOSIS — M6281 Muscle weakness (generalized): Secondary | ICD-10-CM | POA: Diagnosis not present

## 2022-07-31 DIAGNOSIS — J9691 Respiratory failure, unspecified with hypoxia: Secondary | ICD-10-CM | POA: Diagnosis not present

## 2022-07-31 DIAGNOSIS — I679 Cerebrovascular disease, unspecified: Secondary | ICD-10-CM | POA: Diagnosis not present

## 2022-07-31 DIAGNOSIS — I509 Heart failure, unspecified: Secondary | ICD-10-CM | POA: Diagnosis not present

## 2022-07-31 DIAGNOSIS — Z7689 Persons encountering health services in other specified circumstances: Secondary | ICD-10-CM | POA: Diagnosis not present

## 2022-07-31 DIAGNOSIS — I130 Hypertensive heart and chronic kidney disease with heart failure and stage 1 through stage 4 chronic kidney disease, or unspecified chronic kidney disease: Secondary | ICD-10-CM | POA: Diagnosis not present

## 2022-07-31 DIAGNOSIS — N1831 Chronic kidney disease, stage 3a: Secondary | ICD-10-CM | POA: Diagnosis not present

## 2022-07-31 DIAGNOSIS — I5032 Chronic diastolic (congestive) heart failure: Secondary | ICD-10-CM | POA: Diagnosis not present

## 2022-07-31 DIAGNOSIS — I4891 Unspecified atrial fibrillation: Secondary | ICD-10-CM | POA: Diagnosis not present

## 2022-07-31 DIAGNOSIS — I10 Essential (primary) hypertension: Secondary | ICD-10-CM | POA: Diagnosis not present

## 2022-08-05 DEATH — deceased

## 2023-08-22 ENCOUNTER — Encounter: Payer: Self-pay | Admitting: Advanced Practice Midwife
